# Patient Record
Sex: Female | Born: 1975
Health system: Southern US, Community
[De-identification: ages and names within clinical notes are randomized; demographics above are authoritative.]

## PROBLEM LIST (undated history)

## (undated) DIAGNOSIS — R5383 Other fatigue: Secondary | ICD-10-CM

## (undated) DIAGNOSIS — C801 Malignant (primary) neoplasm, unspecified: Secondary | ICD-10-CM

## (undated) DIAGNOSIS — Z8701 Personal history of pneumonia (recurrent): Secondary | ICD-10-CM

## (undated) DIAGNOSIS — D649 Anemia, unspecified: Secondary | ICD-10-CM

## (undated) DIAGNOSIS — Z91018 Allergy to other foods: Secondary | ICD-10-CM

## (undated) DIAGNOSIS — R0602 Shortness of breath: Secondary | ICD-10-CM

## (undated) DIAGNOSIS — T7840XA Allergy, unspecified, initial encounter: Secondary | ICD-10-CM

## (undated) DIAGNOSIS — C50919 Malignant neoplasm of unspecified site of unspecified female breast: Secondary | ICD-10-CM

## (undated) HISTORY — DX: Personal history of pneumonia (recurrent): Z87.01

## (undated) HISTORY — DX: Shortness of breath: R06.02

## (undated) HISTORY — DX: Other fatigue: R53.83

## (undated) HISTORY — PX: TUBAL LIGATION: SHX77

## (undated) HISTORY — DX: Allergy to other foods: Z91.018

---

## 1998-06-25 ENCOUNTER — Ambulatory Visit (HOSPITAL_COMMUNITY): Admission: RE | Admit: 1998-06-25 | Discharge: 1998-06-25 | Payer: Self-pay | Admitting: Obstetrics & Gynecology

## 1998-07-01 ENCOUNTER — Other Ambulatory Visit: Admission: RE | Admit: 1998-07-01 | Discharge: 1998-07-01 | Payer: Self-pay | Admitting: Obstetrics & Gynecology

## 1999-02-02 HISTORY — PX: MYOMECTOMY: SHX85

## 1999-05-01 ENCOUNTER — Inpatient Hospital Stay (HOSPITAL_COMMUNITY): Admission: RE | Admit: 1999-05-01 | Discharge: 1999-05-03 | Payer: Self-pay | Admitting: Obstetrics & Gynecology

## 1999-05-01 ENCOUNTER — Encounter (INDEPENDENT_AMBULATORY_CARE_PROVIDER_SITE_OTHER): Payer: Self-pay | Admitting: Specialist

## 1999-11-10 ENCOUNTER — Other Ambulatory Visit: Admission: RE | Admit: 1999-11-10 | Discharge: 1999-11-10 | Payer: Self-pay | Admitting: Obstetrics & Gynecology

## 2000-01-10 ENCOUNTER — Emergency Department (HOSPITAL_COMMUNITY): Admission: EM | Admit: 2000-01-10 | Discharge: 2000-01-10 | Payer: Self-pay | Admitting: Emergency Medicine

## 2000-03-17 ENCOUNTER — Inpatient Hospital Stay (HOSPITAL_COMMUNITY): Admission: AD | Admit: 2000-03-17 | Discharge: 2000-03-17 | Payer: Self-pay | Admitting: Obstetrics and Gynecology

## 2000-04-07 ENCOUNTER — Inpatient Hospital Stay (HOSPITAL_COMMUNITY): Admission: AD | Admit: 2000-04-07 | Discharge: 2000-04-07 | Payer: Self-pay | Admitting: Obstetrics and Gynecology

## 2000-04-22 ENCOUNTER — Inpatient Hospital Stay (HOSPITAL_COMMUNITY): Admission: AD | Admit: 2000-04-22 | Discharge: 2000-04-22 | Payer: Self-pay | Admitting: Obstetrics & Gynecology

## 2000-04-23 ENCOUNTER — Inpatient Hospital Stay (HOSPITAL_COMMUNITY): Admission: AD | Admit: 2000-04-23 | Discharge: 2000-04-23 | Payer: Self-pay | Admitting: Obstetrics and Gynecology

## 2000-05-01 ENCOUNTER — Inpatient Hospital Stay (HOSPITAL_COMMUNITY): Admission: AD | Admit: 2000-05-01 | Discharge: 2000-05-01 | Payer: Self-pay | Admitting: Obstetrics & Gynecology

## 2000-05-12 ENCOUNTER — Ambulatory Visit (HOSPITAL_COMMUNITY): Admission: RE | Admit: 2000-05-12 | Discharge: 2000-05-12 | Payer: Self-pay | Admitting: Obstetrics & Gynecology

## 2000-05-13 ENCOUNTER — Inpatient Hospital Stay (HOSPITAL_COMMUNITY): Admission: AD | Admit: 2000-05-13 | Discharge: 2000-05-16 | Payer: Self-pay | Admitting: Obstetrics & Gynecology

## 2000-05-26 ENCOUNTER — Encounter: Admission: RE | Admit: 2000-05-26 | Discharge: 2000-06-09 | Payer: Self-pay | Admitting: Obstetrics & Gynecology

## 2000-06-21 ENCOUNTER — Other Ambulatory Visit: Admission: RE | Admit: 2000-06-21 | Discharge: 2000-06-21 | Payer: Self-pay | Admitting: Obstetrics & Gynecology

## 2001-03-27 ENCOUNTER — Other Ambulatory Visit: Admission: RE | Admit: 2001-03-27 | Discharge: 2001-03-27 | Payer: Self-pay | Admitting: Obstetrics & Gynecology

## 2001-05-06 ENCOUNTER — Emergency Department (HOSPITAL_COMMUNITY): Admission: EM | Admit: 2001-05-06 | Discharge: 2001-05-06 | Payer: Self-pay | Admitting: Emergency Medicine

## 2001-06-13 ENCOUNTER — Inpatient Hospital Stay (HOSPITAL_COMMUNITY): Admission: AD | Admit: 2001-06-13 | Discharge: 2001-06-13 | Payer: Self-pay | Admitting: Obstetrics and Gynecology

## 2001-09-17 ENCOUNTER — Inpatient Hospital Stay (HOSPITAL_COMMUNITY): Admission: AD | Admit: 2001-09-17 | Discharge: 2001-09-17 | Payer: Self-pay | Admitting: Obstetrics and Gynecology

## 2001-09-18 ENCOUNTER — Inpatient Hospital Stay (HOSPITAL_COMMUNITY): Admission: AD | Admit: 2001-09-18 | Discharge: 2001-09-18 | Payer: Self-pay | Admitting: Obstetrics & Gynecology

## 2001-09-21 ENCOUNTER — Inpatient Hospital Stay (HOSPITAL_COMMUNITY): Admission: AD | Admit: 2001-09-21 | Discharge: 2001-09-21 | Payer: Self-pay | Admitting: Obstetrics and Gynecology

## 2001-10-04 ENCOUNTER — Ambulatory Visit (HOSPITAL_COMMUNITY): Admission: RE | Admit: 2001-10-04 | Discharge: 2001-10-04 | Payer: Self-pay | Admitting: Obstetrics & Gynecology

## 2001-10-10 ENCOUNTER — Inpatient Hospital Stay (HOSPITAL_COMMUNITY): Admission: AD | Admit: 2001-10-10 | Discharge: 2001-10-13 | Payer: Self-pay | Admitting: Obstetrics & Gynecology

## 2001-11-21 ENCOUNTER — Other Ambulatory Visit: Admission: RE | Admit: 2001-11-21 | Discharge: 2001-11-21 | Payer: Self-pay | Admitting: Obstetrics & Gynecology

## 2002-05-20 ENCOUNTER — Emergency Department (HOSPITAL_COMMUNITY): Admission: EM | Admit: 2002-05-20 | Discharge: 2002-05-20 | Payer: Self-pay | Admitting: Emergency Medicine

## 2003-01-17 ENCOUNTER — Other Ambulatory Visit: Admission: RE | Admit: 2003-01-17 | Discharge: 2003-01-17 | Payer: Self-pay | Admitting: Obstetrics & Gynecology

## 2003-05-19 ENCOUNTER — Emergency Department (HOSPITAL_COMMUNITY): Admission: EM | Admit: 2003-05-19 | Discharge: 2003-05-19 | Payer: Self-pay | Admitting: Emergency Medicine

## 2003-08-27 ENCOUNTER — Inpatient Hospital Stay (HOSPITAL_COMMUNITY): Admission: AD | Admit: 2003-08-27 | Discharge: 2003-08-27 | Payer: Self-pay | Admitting: Obstetrics and Gynecology

## 2003-09-27 ENCOUNTER — Inpatient Hospital Stay (HOSPITAL_COMMUNITY): Admission: AD | Admit: 2003-09-27 | Discharge: 2003-09-27 | Payer: Self-pay | Admitting: Obstetrics and Gynecology

## 2003-10-30 ENCOUNTER — Inpatient Hospital Stay (HOSPITAL_COMMUNITY): Admission: AD | Admit: 2003-10-30 | Discharge: 2003-10-31 | Payer: Self-pay | Admitting: Obstetrics and Gynecology

## 2003-11-23 ENCOUNTER — Inpatient Hospital Stay (HOSPITAL_COMMUNITY): Admission: RE | Admit: 2003-11-23 | Discharge: 2003-11-26 | Payer: Self-pay | Admitting: Obstetrics & Gynecology

## 2004-01-07 ENCOUNTER — Other Ambulatory Visit: Admission: RE | Admit: 2004-01-07 | Discharge: 2004-01-07 | Payer: Self-pay | Admitting: Obstetrics & Gynecology

## 2004-06-30 ENCOUNTER — Ambulatory Visit: Payer: Self-pay | Admitting: Pulmonary Disease

## 2004-07-28 ENCOUNTER — Ambulatory Visit: Payer: Self-pay | Admitting: Pulmonary Disease

## 2005-05-11 ENCOUNTER — Ambulatory Visit: Payer: Self-pay | Admitting: Pulmonary Disease

## 2005-05-25 ENCOUNTER — Ambulatory Visit: Payer: Self-pay | Admitting: Pulmonary Disease

## 2005-10-20 ENCOUNTER — Ambulatory Visit: Payer: Self-pay | Admitting: Pulmonary Disease

## 2007-04-08 ENCOUNTER — Emergency Department (HOSPITAL_COMMUNITY): Admission: EM | Admit: 2007-04-08 | Discharge: 2007-04-08 | Payer: Self-pay | Admitting: Emergency Medicine

## 2007-05-15 ENCOUNTER — Ambulatory Visit: Payer: Self-pay | Admitting: Pulmonary Disease

## 2007-05-15 ENCOUNTER — Telehealth (INDEPENDENT_AMBULATORY_CARE_PROVIDER_SITE_OTHER): Payer: Self-pay | Admitting: *Deleted

## 2007-05-15 DIAGNOSIS — J309 Allergic rhinitis, unspecified: Secondary | ICD-10-CM | POA: Insufficient documentation

## 2007-05-15 DIAGNOSIS — J45909 Unspecified asthma, uncomplicated: Secondary | ICD-10-CM | POA: Insufficient documentation

## 2007-06-06 ENCOUNTER — Telehealth (INDEPENDENT_AMBULATORY_CARE_PROVIDER_SITE_OTHER): Payer: Self-pay | Admitting: *Deleted

## 2008-05-09 ENCOUNTER — Telehealth (INDEPENDENT_AMBULATORY_CARE_PROVIDER_SITE_OTHER): Payer: Self-pay | Admitting: *Deleted

## 2008-05-09 ENCOUNTER — Ambulatory Visit: Payer: Self-pay | Admitting: Pulmonary Disease

## 2008-06-10 ENCOUNTER — Ambulatory Visit: Payer: Self-pay | Admitting: Pulmonary Disease

## 2008-10-09 ENCOUNTER — Inpatient Hospital Stay (HOSPITAL_COMMUNITY): Admission: AD | Admit: 2008-10-09 | Discharge: 2008-10-09 | Payer: Self-pay | Admitting: Specialist

## 2008-10-17 ENCOUNTER — Inpatient Hospital Stay (HOSPITAL_COMMUNITY): Admission: AD | Admit: 2008-10-17 | Discharge: 2008-10-17 | Payer: Self-pay | Admitting: Obstetrics and Gynecology

## 2008-11-01 ENCOUNTER — Inpatient Hospital Stay (HOSPITAL_COMMUNITY): Admission: RE | Admit: 2008-11-01 | Discharge: 2008-11-04 | Payer: Self-pay | Admitting: Obstetrics & Gynecology

## 2008-11-01 ENCOUNTER — Encounter (INDEPENDENT_AMBULATORY_CARE_PROVIDER_SITE_OTHER): Payer: Self-pay | Admitting: Obstetrics & Gynecology

## 2008-11-01 HISTORY — PX: OTHER SURGICAL HISTORY: SHX169

## 2009-05-16 ENCOUNTER — Telehealth (INDEPENDENT_AMBULATORY_CARE_PROVIDER_SITE_OTHER): Payer: Self-pay | Admitting: *Deleted

## 2009-07-21 ENCOUNTER — Ambulatory Visit: Payer: Self-pay | Admitting: Pulmonary Disease

## 2009-07-22 DIAGNOSIS — K2970 Gastritis, unspecified, without bleeding: Secondary | ICD-10-CM

## 2009-07-22 DIAGNOSIS — K2990 Gastroduodenitis, unspecified, without bleeding: Secondary | ICD-10-CM

## 2009-07-22 DIAGNOSIS — J189 Pneumonia, unspecified organism: Secondary | ICD-10-CM | POA: Insufficient documentation

## 2010-01-19 ENCOUNTER — Ambulatory Visit: Payer: Self-pay | Admitting: Pulmonary Disease

## 2010-01-20 ENCOUNTER — Telehealth: Payer: Self-pay | Admitting: Pulmonary Disease

## 2010-03-03 NOTE — Assessment & Plan Note (Signed)
Summary: rov/meds/apc   Primary Care Provider:  Kriste Basque  CC:  3.5 year ROV & review....  History of Present Illness: 35 y/o BF, daughter of Amber Stephens, & mother of 4 children- here for a follow up visit...   Current Problems:   ALLERGIC RHINITIS (ICD-477.9) - hx allergy testing yrs ago by drTBratton w/ +reactions to pollen, trees, grasses... she uses OTC antihist Prn...  ASTHMA (ICD-493.90) - she is a never smoker, hx asthma and AB x yrs... on PROAIR inhaler Prn and hasn't needed it in months she says... triggers include upper resp infections & springtime allergen exposures... occas requires Depo/ Pred (none for yrs)- she was on Vanceril inhaler in the 90's & stopped on her own... PFT 6/11 w/ mild persist asthma w/ obstruction on PFT >> we discussed starting ADVAIR 250- Bid regularly...  ~  baseline CXR showed clear, NAD...  ~  PFT 6/06 showed FVC=2.84 (100%), FEV1=2.00 (82%), %1sec=71, mid-flows=45%pred.  ~  PFT 6/11 showed FVC=2.28 (78%), FEV1=1.64 (66%), %1sec=72, mid-flows=38%pred.  Hx of PNEUMONIA (ICD-486) - hx LLL CAP in 1997 resolved w/ Cephalosporin Rx + ZPak...  Hx of GASTRITIS (ICD-535.50) - remote hx gastritis treated w/ H2Blockers at the time... denies dysphagia, heartburn, indigestion, n/v, abd pain, etc...  Health Maintenance:  GYN= DrNeal et al... GI= DrStark yrs ago (she works at [employer])... prev labs here 2007 were all WNL including CBC, Chem, FLP, TSH...   Preventive Screening-Counseling & Management  Alcohol-Tobacco     Smoking Status: never  Allergies (verified): No Known Drug Allergies  Comments:  Nurse/Medical Assistant: The patient's medications and allergies were reviewed with the patient and were updated in the Medication and Allergy Lists.  Past History:  Past Medical History: ALLERGIC RHINITIS (ICD-477.9) ASTHMA (ICD-493.90) Hx of PNEUMONIA (ICD-486) Hx of GASTRITIS (ICD-535.50)  Past Surgical History: Cesarean sections BTL  10/10 by DrLowe  Family History: Reviewed history and no changes required.  Social History: Reviewed history from 05/09/2008 and no changes required. 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    Vital Signs:  Patient profile:   34 year old female Height:      62 inches Weight:      185 pounds BMI:     33.96 O2 Sat:      100 % on Room air Temp:     97.7 degrees F oral Pulse rate:   77 / minute BP sitting:   122 / 76  (left arm) Cuff size:   regular  Vitals Entered By: Randell Loop CMA (July 21, 2009 2:41 PM)  O2 Sat at Rest %:  100 O2 Flow:  Room air CC: 3.5 year ROV & review... Is Patient Diabetic? No Pain Assessment Patient in pain? no      Comments meds updated today with pt   Physical Exam  Additional Exam:  WD, WN, 35 y/o BF in NAD... GENERAL:  Alert & oriented; pleasant & cooperative... HEENT:  [place]/AT, EOM-wnl, PERRLA, Fundi-benign, EACs-clear, TMs-wnl, NOSE-clear, THROAT-clear & wnl. NECK:  Supple w/ full ROM; no JVD; normal carotid impulses w/o bruits; no thyromegaly  or nodules palpated; no lymphadenopathy. CHEST:  Clear to P & A; without wheezes/ rales/ or rhonchi. HEART:  Regular Rhythm; without murmurs/ rubs/ or gallops. ABDOMEN:  Soft & nontender; normal bowel sounds; no organomegaly or masses detected. EXT: without deformities or arthritic changes; no varicose veins/ venous insuffic/ or edema. NEURO:  CN's intact; motor  testing normal; sensory testing normal; gait normal & balance OK. DERM:  No lesions noted; no rash etc...    Pulmonary Function Test Date: 07/21/2009 3:20 PM Gender: Female  Pre-Spirometry FVC    Value: 2.28 L/min   Pred: 2.93 L/min     % Pred: 78 % FEV1    Value: 1.64 L     Pred: 2.47 L     % Pred: 66 % FEV1/FVC  Value: 72 %     Pred: 85 %     % Pred: -- % FEF 25-75  Value: 1.10 L/min   Pred: 2.91 L/min     % Pred: 38 %  Comments: Compatible w/ mild airflow obstruction...  SN  Impression & Recommendations:  Problem # 1:  ASTHMA (ICD-493.90) She appears to have mild persistant asthma >> particularly signif in that she is totally asymptoatic at this time & hasn't needed her rescue inhalerin months... we discussed the signif of this & the need for controller medication... she chose ADVAIR 250- Bid regularly & we will refill the Proair for Prn use...  Her updated medication list for this problem includes:    Proair Hfa 108 (90 Base) Mcg/act Aers (Albuterol sulfate) .Marland Kitchen... 1-2 puffs every 4-6 hours as needed    Advair Diskus 250-50 Mcg/dose Aepb (Fluticasone-salmeterol) .Marland Kitchen... 1 inhalation two times a day...  Orders: Spirometry w/Graph (94010)  Problem # 2:  OTHER MEDICAL PROBLEMS AS NOTED>>>  Complete Medication List: 1)  Proair Hfa 108 (90 Base) Mcg/act Aers (Albuterol sulfate) .Marland Kitchen.. 1-2 puffs every 4-6 hours as needed 2)  Advair Diskus 250-50 Mcg/dose Aepb (Fluticasone-salmeterol) .Marland Kitchen.. 1 inhalation two times a day...  Patient Instructions: 1)  Today we updated your med list- see below.... 2)  We decided to start ASTHMA therapy w/ ADVAIR one inhalation two times a day... 3)  Continue ther PROAIR rescue inhaler as needed... 4)  Call for any questions.Marland KitchenMarland Kitchen 5)  Please schedule a follow-up appointment in 6 months. Prescriptions: PROAIR HFA 108 (90 BASE) MCG/ACT AERS (ALBUTEROL SULFATE) 1-2 puffs every 4-6 hours as needed  #1 x 11   Entered and Authorized by:   Michele Mcalpine MD    Signed by:   Michele Mcalpine MD on 07/21/2009   Method used:   Print then Give to Patient   RxID:   [id number] ADVAIR DISKUS 250-50 MCG/DOSE AEPB (FLUTICASONE-SALMETEROL) 1 inhalation two times a day...  #1 x 11   Entered and Authorized by:   Michele Mcalpine MD   Signed by:   Michele Mcalpine MD on 07/21/2009   Method used:   Print then Give to Patient   RxID:   [id number]    Immunization History:  Influenza Immunization History:    Influenza:  historical (11/14/2008)    CardioPerfect Spirometry  ID: [id number] Patient: AMALYA, SALMONS DOB: Jan 06, 1976 Age: 35 Years Old Sex: Female Race: Black Physician: Tigerlily Christine Height: 62 Weight: 185 Status: Unconfirmed Past Medical History:   ALLERGIC RHINITIS (ICD-477.9)-seasonal   ASTHMA (ICD-493.90)-intermittent as needed proair  HMA-  GYN - Dr. Jennette Kettle   Recorded: 07/21/2009 3:20 PM  Parameter  Measured Predicted %Predicted FVC  2.28        2.93        78 FEV1     1.64        2.47        66 FEV1%   72        84.72        -- PEF    4.19        6.42        65.20   Interpretation:

## 2010-03-03 NOTE — Progress Notes (Signed)
Summary: Proair refill  Phone Note Call from Patient Call back at [phone]   Caller: Patient Call For: nadel Reason for Call: Refill Medication, Talk to Nurse Summary of Call: rx for albuterol hand held inhaler sent to [facility] on [address] [facility]. Initial call taken by: Eugene Gavia,  May 16, 2009 10:58 AM  Follow-up for Phone Call        Tammy this patient has never had follow-up with SN. Please advise if okay to refill x1 and then no more refills til seen by SN. Or ROV with you first before refill?Michel Bickers [facility]  May 16, 2009 11:06 AM  Additional Follow-up for Phone Call Additional follow up Details #1::        must see Dr. Kriste Basque  can have 1 proair only w/ no refiilss  set up ov w/ dr Kriste Basque by [place] before you send rx  Additional Follow-up by: Rubye Oaks NP,  May 16, 2009 11:12 AM    Additional Follow-up for Phone Call Additional follow up Details #2::    LMOMTCB. The pt has appt with SN on July 21, 2009 and MUST keep this appt for any other refills.Michel Bickers [facility]  May 16, 2009 11:23 AM  The patient is aware and had verbal understanding that she has to keep her appt in June with SN before any of her medications can be refilled. RX for rescue inhaler sent to pharmacy. [facility] [address]  Follow-up by: Michel Bickers CMA,  May 16, 2009 11:39 AM  Prescriptions: PROAIR HFA 108 (90 BASE) MCG/ACT AERS (ALBUTEROL SULFATE) 1-2 puffs every 4-6 hours as needed  #1 x 0   Entered by:   Michel Bickers CMA   Authorized by:   Rubye Oaks NP   Signed by:   Michel Bickers CMA on 05/16/2009   Method used:   Electronically to        [facility]. [address] [phone]* (retail)       [address]       [place], [place]  [address]       Ph: [phone] or [phone]       Fax: [phone]   RxID:   [id number]

## 2010-03-05 NOTE — Progress Notes (Signed)
Summary: nos appt  Phone Note Call from Patient   Caller: [email or web address]  Call For: Sybella Harnish Summary of Call: LMTCB x2 to rsc nos from 12/19. Initial call taken by: Darletta Moll,  January 20, 2010 3:49 PM

## 2010-05-07 ENCOUNTER — Other Ambulatory Visit: Payer: Self-pay | Admitting: Adult Health

## 2010-05-07 LAB — CBC
HCT: 22.6 % — ABNORMAL LOW (ref 36.0–46.0)
HCT: 25.9 % — ABNORMAL LOW (ref 36.0–46.0)
Hemoglobin: 7.8 g/dL — CL (ref 12.0–15.0)
Hemoglobin: 8.9 g/dL — ABNORMAL LOW (ref 12.0–15.0)
MCHC: 34.3 g/dL (ref 30.0–36.0)
MCHC: 34.3 g/dL (ref 30.0–36.0)
MCV: 92.6 fL (ref 78.0–100.0)
MCV: 92.7 fL (ref 78.0–100.0)
MCV: 93.3 fL (ref 78.0–100.0)
Platelets: 262 K/uL (ref 150–400)
Platelets: 267 K/uL (ref 150–400)
Platelets: 324 10*3/uL (ref 150–400)
RBC: 2.44 MIL/uL — ABNORMAL LOW (ref 3.87–5.11)
RBC: 2.69 MIL/uL — ABNORMAL LOW (ref 3.87–5.11)
RBC: 2.79 MIL/uL — ABNORMAL LOW (ref 3.87–5.11)
RDW: 14.2 % (ref 11.5–15.5)
RDW: 14.7 % (ref 11.5–15.5)
WBC: 11.9 K/uL — ABNORMAL HIGH (ref 4.0–10.5)
WBC: 8.9 10*3/uL (ref 4.0–10.5)
WBC: 9.8 K/uL (ref 4.0–10.5)

## 2010-05-08 LAB — CBC
HCT: 33.9 % — ABNORMAL LOW (ref 36.0–46.0)
MCHC: 33.8 g/dL (ref 30.0–36.0)
MCV: 92.4 fL (ref 78.0–100.0)
Platelets: 310 10*3/uL (ref 150–400)
Platelets: 324 10*3/uL (ref 150–400)
RBC: 3.67 MIL/uL — ABNORMAL LOW (ref 3.87–5.11)
RBC: 3.76 MIL/uL — ABNORMAL LOW (ref 3.87–5.11)
RDW: 14.4 % (ref 11.5–15.5)
WBC: 10.9 10*3/uL — ABNORMAL HIGH (ref 4.0–10.5)

## 2010-05-08 LAB — WET PREP, GENITAL: Yeast Wet Prep HPF POC: NONE SEEN

## 2010-05-08 LAB — COMPREHENSIVE METABOLIC PANEL
AST: 22 U/L (ref 0–37)
Albumin: 3.4 g/dL — ABNORMAL LOW (ref 3.5–5.2)
Alkaline Phosphatase: 102 U/L (ref 39–117)
BUN: 4 mg/dL — ABNORMAL LOW (ref 6–23)
CO2: 22 mEq/L (ref 19–32)
Chloride: 104 mEq/L (ref 96–112)
GFR calc non Af Amer: >60 mL/min (ref >60)
Potassium: 3.7 mEq/L (ref 3.5–5.1)
Total Bilirubin: 0.6 mg/dL (ref 0.3–1.2)

## 2010-05-08 LAB — RPR: RPR Ser Ql: NONREACTIVE

## 2010-06-19 NOTE — Op Note (Signed)
[facility]  Patient:    Amber Stephens, Amber Stephens                   MRN: [id number] Proc. Date: 05/13/00 Adm. Date:  [id number] Attending:  Minette Headland                           Operative Report  PREOPERATIVE DIAGNOSES:       1. Intrauterine pregnancy [redacted] weeks gestation.                               2. Mature lecithin-sphingomyelin ratio on                                  amniotic fluid a 4.8:1.                               3. Surgically scarred uterus.                               4. Preterm cervical ______ .  POSTOPERATIVE DIAGNOSES:      1. Intrauterine pregnancy [redacted] weeks gestation.                               2. Mature lecithin-sphingomyelin ratio on                                  amniotic fluid a 4.8:1.                               3. Surgically scarred uterus.                               4. Preterm cervical ______ .                               5. Delivery of viable female infant, Apgars 9 and                                  9, assigned by Dr. Dagoberto Ligas,                                  neonatologist in attendance.  Cord arterial                                  pH was 7.26.  SURGEON:                      Freddy Finner, M.D.  ANESTHESIA:                   Spinal.  INTRAOPERATIVE COMPLICATIONS:  None.  ESTIMATED INTRAOPERATIVE BLOOD LOSS:    600-800 cc.  DESCRIPTION OF PROCEDURE:     Details of the present illness recorded in the admission note.  The patient was admitted undergoing surgery, brought to the operating room and there placed under adequate spinal anesthesia, placed in the dorsal recumbent position with elevation of the right hip by 15 degrees. Abdomen was prepped in the usual fashion.  Foley catheter was placed using sterile technique.  Sterile drapes were applied.  A lower abdominal incision was made and in the process, an old lower abdominal transverse scar excised. The incision was  carried sharply down to fascia which was entered sharply and extended to the skin incision.  The rectus sheath was developed with blunt and sharp dissection.  Rectus muscles were divided in the midline.  Peritoneum was entered and extended to the extent of the skin incision.  Bladder blade was placed at this point.  A transverse incision was made in the visceral peritoneum overlying the lower uterine segment and the bladder bluntly dissected off the lower segment.  The lower segment was then entered in a transverse direction and extended bluntly to the extent of skin incision. Amniotic membrane was ruptured; fluid was clear.  A viable female infant was then delivered without significant difficulty.  Cord blood was obtained for routine and for arterial gases.  Placenta and other products of conception removed from the uterus.  This was confirmed by manual exploration of the uterus.  The uterine incision was then closed with running 0 Monocryl suture. Irrigation was carried out.  Bladder flap was relatively approximated just for the uterine closure, and an additional suture was not used.  The tubes and ovaries were inspected and found to be normal.  The uterine fundus was palpably normal, and no palpable adhesions were noted on the fundus. Appropriate pack, needle, and instrument counts were obtained.  Abdominal incision was closed in layers.  Running 0 Monocryl was used to close the peritoneum and reapproximate the rectus muscles.  Fascia was closed with running 0 Panacryl.  Skin was closed with wide skin staples and 0.25 inch Steri-Strips.  The patient tolerated the procedure well and was taken to the recovery room in good condition. DD:  05/14/00 TD:  05/14/00 Job: [id number]

## 2010-06-19 NOTE — Op Note (Signed)
Amber Stephens, Amber Stephens            ACCOUNT NO.:  [id number]   MEDICAL RECORD NO.:  [id number]          PATIENT TYPE:  INP   LOCATION:  9198                          FACILITY:  [facility]   PHYSICIAN:  Freddy Finner, M.D.   DATE OF BIRTH:  [birth year]   DATE OF PROCEDURE:  11/23/2003  DATE OF DISCHARGE:                                 OPERATIVE REPORT   PREOPERATIVE DIAGNOSES:  1.  Intrauterine pregnancy [redacted] weeks gestation.  2.  Surgically scarred uterus.  3.  Mild light onset of pregnancy induced hypertension.   POSTOPERATIVE DIAGNOSES:  1.  Intrauterine pregnancy [redacted] weeks gestation.  2.  Surgically scarred uterus.  3.  Mild light onset of pregnancy induced hypertension.   OPERATION:  Repeat low transverse cervical cesarean section with delivery of  viable female infant, Apgar's of 9 and 10. Cord pH 7.29 on arterial cord  sample, birth weight 6 pounds 5 ounces.   SURGEON:  Freddy Finner, M.D.   ASSISTANT:  No additional assistant other than the scrub nurse.   COMPLICATIONS:  None.   ESTIMATED BLOOD LOSS:  Less than or equal to 800 mL.   Details of the present illness recorded in the admission note. The patient  was admitted on the morning of surgery. She was brought to the operating  room, placed under adequate spinal anesthesia, placed in the dorsal  recumbent position. Abdomen was prepped and draped in the usual fashion,  Foley catheter was placed using sterile technique.  A low abdominal  transverse incision was made through an old scar, carried sharply down the  fascia which was entered sharply and extended to the extent of skin  incision.  There was notable scarring because this is the fourth procedure  through this incision.  The rectus sheath was developed carefully with blunt  and sharp dissection.  The rectus muscles were divided in the midline, the  peritoneum was entered sharply and extended bluntly to the extent of the  skin incision.  The bladder blade was placed. A  transverse incision was made  in the lower uterine segment, carried down to the amnion. The incision was  extended sharply with bandage scissors. A small midline extension was made  vertically on the uterus as well as an incision in the fascia vertically to  allow more room for delivery because of the scarring. Also a small incision  was made in the right rectus muscle. Doing all of these things plus using  the kiwi vacuum device, the infant was delivered successfully, Apgar's and  other statistics are noted above. The placenta and other parts of conception  removed from the uterus after collecting of cord blood samples. The uterus  was delivered onto the anterior abdominal wall. The uterus itself appeared  to be normal as did the tubes and ovaries. The uterine incision was closed  with running locking #0 Monocryl followed by an imbricating suture of #0  Monocryl. The uterus was placed back into the abdominal cavity. Irrigation  was carried out. Hemostasis was adequate. Pack, needle and instrument counts  were correct. Abdominal incision was closed in layers,  running #0 Monocryl  was used to close the peritoneum to reapproximate the rectus muscles and to  reduce the defect of the incision in the right rectus muscle.  #0 PDS was  then used to close the vertical portion of the fascial incision.  The  transverse incision was closed with a running #0 PDS suture. The  subcutaneous tissue was approximated with running 2-0 Plain. The skin was  closed with wide skin staples. The patient tolerated the operative procedure  well  and was taken to the recovery room in good condition.     Hosie Spangle   WRN/MEDQ  D:  11/23/2003  T:  11/23/2003  Job:  [id number]

## 2010-06-19 NOTE — H&P (Signed)
NAMESHAMAYA, KAUER            ACCOUNT NO.:  [id number]   MEDICAL RECORD NO.:  [id number]          PATIENT TYPE:  INP   LOCATION:  NA                            [facility]:  [facility]   PHYSICIAN:  Freddy Finner, M.D.   DATE OF BIRTH:  01/29/1976   DATE OF ADMISSION:  11/23/2003  DATE OF DISCHARGE:                                HISTORY & PHYSICAL   ADMISSION DIAGNOSES:  1.  Intrauterine pregnancy [redacted] weeks gestation.  2.  Surgically scarred uterus by previous deep myomectomy and previous      cesarean section x2.  3.  Mild late onset pregnancy induced hypertension and proteinuria.   The patient is a 35 year old, black, married female, gravida 3, para 2 who  had a myomectomy with a deep incision into the uterus prior to her first  conception. She subsequently delivered two infants by cesarean birth, most  recently in September of 2003. Her prenatal course to date has been  uncomplicated until the 19th of this month at which time she was first noted  to have proteinuria and a mild bump in her blood pressure.  She was seen on  the day prior to admission for followup of pregnancy induced hypertension  labs which showed all parameters within normal limits. She did, however,  have 1+ proteinuria, blood pressure 120/60 in the office and based on her  gestational age of [redacted] weeks, she is now admitted for cesarean delivery. She  is having some lower abdominal pressure and a mild headache but with no  other CNS symptoms.   REVIEW OF SYMPTOMS:  Her current review of systems other than above is  negative.   PAST MEDICAL HISTORY:  Recorded in detail in the prenatal summary and will  not be repeated at this time.   PHYSICAL EXAMINATION:  HEENT:  Grossly within normal limits.  VITAL SIGNS:  Blood pressure is 120/60, deep tendon reflexes are +1 with no  evidence of clonus.  HEART:  Normal sinus rhythm without murmurs, rubs or gallops.  LUNGS:  Clear to auscultation.  ABDOMEN:  Gravida, estimated  fetal size of 7 pounds.  PELVIC:  Cervix is not checked.  EXTREMITIES:  +1 edema.   ASSESSMENT:  Intrauterine pregnancy, [redacted] weeks gestation, mild late onset  pregnancy induced hypertension, surgically scarred uterus.   PLAN:  Repeat cesarean delivery.     Hosie Spangle   WRN/MEDQ  D:  11/22/2003  T:  11/22/2003  Job:  [id number]

## 2010-06-19 NOTE — Op Note (Signed)
NAME:  Amber Stephens, Amber Stephens                      ACCOUNT NO.:  [id number]   MEDICAL RECORD NO.:  [id number]                   PATIENT TYPE:  INP   LOCATION:  9127                                 [facility]ACILITY:  [facility]   PHYSICIAN:  Freddy Finner, M.D.                DATE OF BIRTH:  06/05/1975   DATE OF PROCEDURE:  10/10/2001  DATE OF DISCHARGE:                                 OPERATIVE REPORT   PREOPERATIVE DIAGNOSES:  1. Intrauterine pregnancy at 37-5/[redacted] weeks gestation.  2. Surgically scarred uterus by a previous date myomectomy and by previous     cesarean delivery.   POSTOPERATIVE DIAGNOSES:  1. Intrauterine pregnancy at 37-5/[redacted] weeks gestation.  2. Surgically scarred uterus by a previous date myomectomy and by previous     cesarean delivery.  3. Extensive abdominal wall adhesions and thickening of the scars.   OPERATIVE PROCEDURE:  1. Repeat low transverse cervical cesarean section.  2. Dissection of adhesions.  3. Delivery of a viable female infant. Apgars 8 and 9.  Birth weight 6 pounds     12 pounds. Cord arterial pH 7.29.   INTRAOPERATIVE COMPLICATIONS:  None.   INTRAOPERATIVE ANESTHESIA:  Spinal.   ESTIMATED INTRAOPERATIVE BLOOD LOSS:  Less than or equal to 800 cc.   DESCRIPTION OF PROCEDURE:  The patient is a 35 year old pregnant for the  second time.  She had an amniocentesis done approximately six days prior to  this admission which showed 1.9 to 1 L/S ratio and no PT present.  This was  done and I attempted to deliver the infant before labor, particularly since  the patient was having preterm labor contractions managed with p.o.  terbutaline.  It was elected to reschedule her for this date and she was  admitted today for delivery.   She was admitted on the morning of surgery, brought to the operating room,  placed under adequate spinal anesthesia and placed in the dorsal recumbent  position.  The abdomen was draped in the usual fashion with Betadine  solution.  A Foley  catheter was placed using sterile technique.  Sterile  drapes were applied.  A lower abdominal scar was felt to be excised.  The  incision was carried sharply down to fascia.  The fascia was thickened and  densely scarred.  With careful sharp dissection the fascia was entered,  rectus sheath was developed superiorly and inferiorly with extensive  dissection of adhesions without intraoperative complications.  Rectus  muscles divided in the midline. The peritoneum was entered sharply and  extended bluntly to the extent of skin incision.  A bladder blade was  placed. A transverse incision was made in the lower uterine segment and  carried down through the amnion.  This was extended bluntly in a transverse  direction.  Amnion was ruptured. Fluid was clear.  Using the kiwi vacuum  extractor and after partial division of the rectus muscles the infant was  delivered without difficulty.  The rectus muscle incision division was  required because of the adhesions and scarring.  The infant had Apgars,  birth weight and pH as noted above.  Placenta and other parts of conception  were removed from the uterus.  The uterine incision was closed in a single  layer of running locking 0 Monocryl. Tubes and ovaries were inspected and  found to be normal bilaterally.  The uterus was palpably normal.  Irrigation  was carried out.  Hemostasis was complete. All packs, needles and  instruments removed. The abdominal incision was closed in layers. A running  0 Vicryl was used to close the peritoneum.  Interrupted Monocryl's were used  to reapproximate the rectus muscle of each side.  This was tied to the  fascial on the right side because of a larger separation in that location.  The fascia was then closed with running 3-0 PDS.  Subcutaneous tissue was  closed with interrupted 2-0 plain. Skin was closed with broad skin staples  and 1/4-inch Steri-Strips. The patient tolerated the procedure well and was  taken to  recovery in good condition.                                               Freddy Finner, M.D.    WRN/MEDQ  D:  10/10/2001  T:  10/10/2001  Job:  [id number]

## 2010-06-19 NOTE — Discharge Summary (Signed)
NAME:  Amber Stephens, Amber Stephens                      ACCOUNT NO.:  [id number]   MEDICAL RECORD NO.:  [id number]                   PATIENT TYPE:  INP   LOCATION:  9127                                 FACILITY:  [facility]   PHYSICIAN:  Guy Sandifer. Henderson Cloud, M.D.              DATE OF BIRTH:  1975/05/05   DATE OF ADMISSION:  10/10/2001  DATE OF DISCHARGE:  10/13/2001                                 DISCHARGE SUMMARY   ADMITTING DIAGNOSES:  1. Intrauterine pregnancy at 31 and 5/7 weeks estimated gestational age.  2. Surgical scarred uterus with a previous myomectomy and previous cesarean     delivery.   DISCHARGE DIAGNOSES:  1. Status post low transverse cesarean delivery.  2. Viable female infant.   PROCEDURE:  1. Repeat low transverse cesarean section.  2. Dissection of adhesions.   REASON FOR ADMISSION:  Please see written H&P.   HOSPITAL COURSE:  The patient was admitted at 18 and 5/7 weeks estimated  gestational age for scheduled cesarean delivery.  The patient had had a  previous myomectomy and a previous cesarean delivery.  The patient was taken  to the operating room where spinal anesthesia was administered without  difficulty.  A low transverse incision was made with the delivery of a  viable female infant weighing 6 pounds 12 ounces with Apgars of 8 at one  minute and 9 at five minutes.  Arterial cord pH was 7.29.  The patient  tolerated procedure well and was taken to the recovery room in stable  condition.  On postoperative day one patient had good return of bowel  function.  Abdomen was soft.  Abdominal dressing was partially removed and  incision was noted to be clean, dry, and intact.  Laboratories revealed  hemoglobin of 10.4, platelet count of 306,000, WBC count of 12.1.  On  postoperative day two patient was tolerating a regular diet without  complaints of nausea and vomiting.  She was ambulating well.  Fundus was  firm and nontender.  Incision was clean, dry, and intact.  On  postoperative  day three patient was doing well.  Incision was clean, dry, and intact.  Staples were removed and patient was discharged home.   CONDITION ON DISCHARGE:  Good.   DIET:  Regular, as tolerated.   ACTIVITY:  No heavy lifting.  No vaginal entry.  No driving x2 weeks.   FOLLOW UP:  The patient is to follow up in the office in one to two weeks  for an incision check.  She is to call for temperature greater than 100  degrees, persistent nausea/vomiting, heavy vaginal bleeding, and/or redness  or drainage from the incision site.    DISCHARGE MEDICATIONS:  1. Percocet 5/325 number 30 one p.o. q.4-6h. p.r.n. pain.  2. Prenatal vitamins one p.o. daily.  3. Colace one p.o. daily p.r.n.     Julio Sicks, NP  Guy Sandifer Henderson Cloud, M.D.    CC/MEDQ  D:  11/11/2001  T:  11/13/2001  Job:  [id number]

## 2010-06-19 NOTE — H&P (Signed)
   Amber Stephens, EDELSON                        ACCOUNT NO.:  [id number]   MEDICAL RECORD NO.:  [id number]                   PATIENT TYPE:   LOCATION:                                       FACILITY:  [facility]   PHYSICIAN:  Freddy Finner, M.D.                DATE OF BIRTH:   DATE OF ADMISSION:  10/10/2001  DATE OF DISCHARGE:                                HISTORY & PHYSICAL   ADMITTING DIAGNOSES:  1. Intrauterine pregnancy at 37-5/[redacted] weeks gestation.  2. Surgically scarred uterus by previous multiple myomectomies and previous     cesarean delivery.   HISTORY OF PRESENT ILLNESS:  The patient is a 35 year old white married  female gravida 2, para 1 by cesarean section done because of previous  myomectomy with deep incision of the myometrium.  This pregnancy has been  complicated by preterm contractions managed with rest, PR terbutaline and  fluid hydration.  Sonogram and amniocentesis for fetal lung maturity was  performed on October 04, 2001 at 36-6/[redacted] weeks gestation.  At that time the  L/S ratio was 1.9 to 1.0, no PTE was present.  Based on this finding, we  have elected to delay her admission for 1 week.  She is now admitted for  cesarean delivery at 37-5/[redacted] weeks gestation.  There have been essentially no  other prenatal complications.   REVIEW OF SYSTEMS:  Her current review of systems is negative, there are no  cardiopulmonary, GI or GU complaints.   PAST MEDICAL HISTORY/FAMILY HISTORY:  According to the prenatal summary and  will not be repeated.   PHYSICAL EXAMINATION:  HEENT: Grossly within normal limits.  NECK: Thyroid gland is not palpably large.  VITAL SIGNS: Blood pressure in the office was 124/80.  CHEST: Clear to auscultation.  HEART: Normal sinus rhythm without murmurs, rubs, or gallops.  ABDOMEN: Gravid, estimated fetal weight of 6-1/2 pounds.  EXTREMITIES: Edema +1 without cyanosis or clubbing.   ASSESSMENT:  Intrauterine pregnancy at 37-5/[redacted] weeks gestation.  Inappropriate candidate for vaginal birth because of multiple myomectomies  with deep incisions of the myometrium.   PLAN:  Cesarean delivery.                                               Freddy Finner, M.D.    WRN/MEDQ  D:  10/09/2001  T:  10/09/2001  Job:  [id number]

## 2010-06-19 NOTE — Discharge Summary (Signed)
[facility]  Patient:    Amber Stephens, Amber Stephens                   MRN: [id number] Adm. Date:  [id number] Disc. Date: [id number] Attending:  Minette Headland Dictator:   Danie Chandler, R.N.                           Discharge Summary  ADMITTING DIAGNOSES:          1. Intrauterine pregnancy at [redacted] weeks gestation.                               2. Mature L:S ratio on amniotic fluid 4.8:1.                               3. Surgically scarred uterus.                               4. Preterm cervical change.  DISCHARGE DIAGNOSES:          1. Intrauterine pregnancy at [redacted] weeks gestation.                               2. Mature L:S ratio on amniotic fluid 4.8:1.                               3. Surgically scarred uterus.                               4. Preterm cervical change.  PROCEDURE:                    Primary cesarean section.  REASON FOR ADMISSION:         Please see dictated H&P.  HOSPITAL COURSE:              The patient was taken to the operating room and underwent the above named procedure without complication.  This was productive of a viable female infant with Apgars of 9 at one minute and 9 at five minutes and an arterial cord pH of 7.26.  Postoperatively on day #1 the patients hemoglobin was 11.6, hematocrit 32.8, and white blood cell count 11.4. Patient was without complaint on this day.  On postoperative day #2 the patient had one temperature elevation to 100.7.  Blood pressures were stable. She was ambulating well without difficulty and had good pain control.  She was also tolerating a regular diet and had a good return of bowel function.  She was stable on this day and her temperature was watched.  On postoperative day #3 patient had been afebrile greater than 24 hours.  She was without complaint and she was discharged home.  CONDITION ON DISCHARGE:       Good.  DIET:                         Regular, as tolerated.  ACTIVITY:                      No heavy lifting,  no driving, no vaginal entry.  FOLLOW-UP:                    She is to follow-up in the office in two weeks for incision check.  She is to call for temperature greater than 100 degrees, persistent nausea or vomiting, heavy vaginal bleeding, and/or redness or drainage from the incision site.  DISCHARGE MEDICATIONS:        1. Prenatal vitamins one p.o. q.d.                               2. Tylox as directed by M.D. DD:  05/31/00 TD:  05/31/00 Job: [id number]

## 2010-06-19 NOTE — Discharge Summary (Signed)
NAMELATRISH, Amber Stephens            ACCOUNT NO.:  [id number]   MEDICAL RECORD NO.:  [id number]          PATIENT TYPE:  INP   LOCATION:  9116                          FACILITY:  [facility]   PHYSICIAN:  Dineen Kid. Rana Snare, M.D.    DATE OF BIRTH:  [birth year]   DATE OF ADMISSION:  11/23/2003  DATE OF DISCHARGE:  11/26/2003                                 DISCHARGE SUMMARY   ADMITTING DIAGNOSES:  1.  Intrauterine pregnancy at 37 weeks estimated gestational age.  2.  Previously-scarred uterus by both myomectomy and cesarean delivery.  3.  Mild pregnancy-induced hypertension.   DISCHARGE DIAGNOSES:  1.  Status post low transverse cesarean section.  2.  Viable female infant.   PROCEDURE:  Repeat low transverse cesarean section.   REASON FOR ADMISSION:  Please see dictated H&P.   HOSPITAL COURSE:  The patient was a 35 year old African-American married  female gravida 3 para 2 that was admitted to [facility] for  a scheduled cesarean section.  The patient had had a previous deep  myomectomy and two previous cesarean deliveries.  On the morning of  admission the patient was taken to the operating room where spinal  anesthesia was administered without difficulty.  A low transverse incision  was made with the delivery of a viable female infant weighing 6 pounds 5.4  ounces with Apgars of 9 at one minute and 9 at five minutes.  Arterial cord  pH was 7.29.  The patient tolerated the procedure well and was taken to the  recovery room in stable condition.  On postoperative day #1, the patient was  without complaint other than some mild wheezing.  Vital signs were stable,  she was afebrile.  Fundus was firm and nontender.  Abdominal dressing was  noted to be clean, dry, and intact.  The patient had known asthma and she  did use albuterol inhaler as needed.  On postoperative day #2, the patient  was without complaint.  Vital signs remained stable, she was afebrile.  Abdomen was soft with good  return of bowel function.  Fundus was firm and  nontender.  Abdominal dressing had been removed revealing an incision that  was clean, dry, and intact.  Lungs were clear to auscultation without  wheezes.  Laboratories revealed hemoglobin of 9.8.  On postoperative day #3,  the patient was without complaint.  Vital signs were stable, she remained  afebrile.  Abdomen soft, fundus was firm and nontender.  Incision was clean,  dry, and intact.  Staples were removed and the patient was discharged home.   CONDITION ON DISCHARGE:  Good.   DIET:  Regular as tolerated.   ACTIVITY:  No heavy lifting, no driving x2 weeks, no vaginal entry..   FOLLOW-UP:  The patient is to follow up in the office in 7-10 days for an  incision check.  She is to call for temperature greater than 100 degrees,  persistent nausea and vomiting, heavy vaginal bleeding, and/or redness or  drainage from the incisional site.   DISCHARGE MEDICATIONS:  1.  Tylox #30 one p.o. q.4-6h. p.r.n.  2.  Motrin 600  mg q.6h.  3.  Prenatal vitamins one p.o. daily.  4.  Colace one p.o. daily p.r.n.      CC/MEDQ  D:  12/20/2003  T:  12/20/2003  Job:  [id number]

## 2010-06-19 NOTE — H&P (Signed)
[facility]  Patient:    Amber Stephens, Amber Stephens           MRN: [id number] Adm. Date:  [id number] Disc. Date: [id number] Attending:  Minette Headland                         History and Physical  ADMITTING DIAGNOSIS:          Intrauterine pregnancy, [redacted] weeks gestation,                               surgically scarred uterus by multiple                               myomectomies, preterm labor arrested by                               tocolytics and bed rest, mature                               lecithin/sphingomyelin ratio of 4.8:1, for                               surgical delivery.  HISTORY OF PRESENT ILLNESS:   The patient is a 35 year old white married female, primigravida who has previously had multiple myomectomies, and has conceived, and had an uncomplicated prenatal course except for early cervical changes and uterine contractions.  This has managed by p.o. terbutaline, bed rest, and close followup.  She has noted marked increase in uterine activity over the last couple of weeks and she was originally scheduled for surgical delivery on May 26, 2000.  Because of the increase in contractions, it was elected to obtain an amniocentesis which was done showing an L/S of 4.8:1, and she is now admitted for cesarean delivery.  REVIEW OF SYSTEMS:            Current review of systems is otherwise negative.  PAST MEDICAL HISTORY:         As recorded in the prenatal summary and will not be repeated.  There are no significant illnesses and no allergies to medications.  PHYSICAL EXAMINATION:  HEENT:                        Grossly within normal limits.  NECK:                         Thyroid gland is not palpably enlarged.  VITAL SIGNS:                  Blood pressure in the office was 120/60.  CHEST:                        Clear to auscultation.  HEART:                        Normal sinus rhythm without significant murmurs, without rubs or  gallops.  ABDOMEN:                      Gravid.  Estimated fetal size of  7+.  CERVIX:                       Two dilated, 50% effaced, vertex at -2.  EXTREMITIES:                  Plus one edema without cyanosis or clubbing.  ASSESSMENT:                   Intrauterine pregnancy at [redacted] weeks gestation,                               surgically scarred uterus, mature                               lecithin/sphingomyelin ratio.  PLAN:                         Primary low transverse cervical cesarean section. DD:  05/13/00 TD:  05/13/00 Job: [id number]

## 2011-05-17 ENCOUNTER — Telehealth: Payer: Self-pay | Admitting: Pulmonary Disease

## 2011-05-17 ENCOUNTER — Other Ambulatory Visit: Payer: Self-pay | Admitting: Adult Health

## 2011-05-17 MED ORDER — ALBUTEROL SULFATE HFA 108 (90 BASE) MCG/ACT IN AERS: INHALATION_SPRAY | RESPIRATORY_TRACT | Status: DC

## 2011-05-17 NOTE — Telephone Encounter (Signed)
I spoke with pt and she is scheduled to come in 07/05/11 due to her scheduled

## 2011-05-17 NOTE — Telephone Encounter (Signed)
Refill for the proair has been sent to the pharmacy for the pt.  No further refills until next ov.  Cal offer appt to see SN on 4-30 at 10:30.

## 2011-05-17 NOTE — Telephone Encounter (Signed)
Pt last seen 07-21-09; please advise if okay to send refill for this time only for rescue inhaler and advise where to put pt for OV. Thanks.

## 2011-05-20 ENCOUNTER — Encounter: Payer: Self-pay | Admitting: *Deleted

## 2011-05-20 ENCOUNTER — Ambulatory Visit (INDEPENDENT_AMBULATORY_CARE_PROVIDER_SITE_OTHER): Payer: BC Managed Care – PPO | Admitting: Adult Health

## 2011-05-20 ENCOUNTER — Telehealth: Payer: Self-pay | Admitting: Pulmonary Disease

## 2011-05-20 ENCOUNTER — Other Ambulatory Visit (INDEPENDENT_AMBULATORY_CARE_PROVIDER_SITE_OTHER): Payer: BC Managed Care – PPO

## 2011-05-20 VITALS — BP 120/84 | HR 97 | Temp 96.9°F | Ht 62.0 in | Wt 180.8 lb

## 2011-05-20 DIAGNOSIS — J45909 Unspecified asthma, uncomplicated: Secondary | ICD-10-CM

## 2011-05-20 MED ORDER — PREDNISONE 10 MG PO TABS: ORAL_TABLET | ORAL | Status: DC

## 2011-05-20 MED ORDER — ALBUTEROL SULFATE (2.5 MG/3ML) 0.083% IN NEBU
2.5000 mg | INHALATION_SOLUTION | Freq: Once | RESPIRATORY_TRACT | Status: AC
  Administered 2011-05-20: 2.5 mg via RESPIRATORY_TRACT

## 2011-05-20 MED ORDER — CETIRIZINE HCL 10 MG PO TABS: 10.0000 mg | ORAL_TABLET | Freq: Every day | ORAL | Status: DC | PRN

## 2011-05-20 MED ORDER — FLUTICASONE-SALMETEROL 250-50 MCG/DOSE IN AEPB: 1.0000 | INHALATION_SPRAY | Freq: Two times a day (BID) | RESPIRATORY_TRACT | Status: DC

## 2011-05-20 NOTE — Progress Notes (Signed)
Addended by: Boone Master E on: 05/20/2011 05:06 PM   Modules accepted: Orders

## 2011-05-20 NOTE — Assessment & Plan Note (Signed)
Exacerbation   Plan:  Prednisone taper over the next week. Begin Zyrtec 10 mg at bedtime daily as needed for nasal drainage. Begin Advair 250/50 one puff twice daily. Followup with Dr. Kriste Basque in  2-3 months and as needed. Please contact office for sooner follow up if symptoms do not improve or worsen or seek emergency care

## 2011-05-20 NOTE — Patient Instructions (Addendum)
Prednisone taper over the next week. Begin Zyrtec 10 mg at bedtime daily as needed for nasal drainage. Begin Advair 250/50 one puff twice daily. Ventolin HFA 2 puffs every 4 hr As needed  Wheezing.  Followup with Dr. Kriste Basque in  2-3 months and as needed. Please contact office for sooner follow up if symptoms do not improve or worsen or seek emergency care

## 2011-05-20 NOTE — Telephone Encounter (Signed)
Called, spoke with pt who c/o wheezing a lot, increased SOB, some chest tightness, and nonprod cough which started on Saturday.  She is using albuterol inhaler 3-4 times daily and has used her son's albuterol nebulizer on one day.  She was last seen by SN on 01/2010 and does have a pending OV with him on 07/2011.  Requesting OV today or something to be called in.    Spoke with Katheren Shams.  Pt can be seen today at 3pm by TP -- pt ok with this appt.

## 2011-05-20 NOTE — Progress Notes (Signed)
Addended by: Boone Master E on: 05/20/2011 03:55 PM   Modules accepted: Orders

## 2011-05-20 NOTE — Progress Notes (Signed)
Subjective:    Patient ID: Amber Stephens, female    DOB: 1975/04/11, 36 y.o.   MRN: [id number]  HPI 36 y/o BF   05/20/2011 Acute OV  Complains of increased SOB, wheezing, dry cough x5 days.   pt reports not taking her adviar 250-4mcg -has been off advair -never took regularly.  No discolored mucus. No fever. Does have nasal drip and drainage.  Has been outside a lot lately with high pollen counts.       PMH:  ALLERGIC RHINITIS (ICD-477.9) - hx allergy testing yrs ago by drTBratton w/ +reactions to pollen, trees, grasses... she uses OTC antihist Prn...   ASTHMA (ICD-493.90) - she is a never smoker, hx asthma and AB x yrs... on PROAIR inhaler Prn and hasn't needed it in months she says... triggers include upper resp infections & springtime allergen exposures... occas requires Depo/ Pred (none for yrs)- she was on Vanceril inhaler in the 90's & stopped on her own... PFT 6/11 w/ mild persist asthma w/ obstruction on PFT >> we discussed starting ADVAIR 250- Bid regularly...  ~ baseline CXR showed clear, NAD...  ~ PFT 6/06 showed FVC=2.84 (100%), FEV1=2.00 (82%), %1sec=71, mid-flows=45%pred.  ~ PFT 6/11 showed FVC=2.28 (78%), FEV1=1.64 (66%), %1sec=72, mid-flows=38%pred.   Hx of PNEUMONIA (ICD-486) - hx LLL CAP in 1997 resolved w/ Cephalosporin Rx + ZPak...   Hx of GASTRITIS (ICD-535.50) - remote hx gastritis treated w/ H2Blockers at the time... denies dysphagia, heartburn, indigestion, n/v, abd pain, etc...  Health Maintenance: GYN= DrNeal et al... GI= DrStark yrs ago (she works at [employer])...      Review of Systems Constitutional:   No  weight loss, night sweats,  Fevers, chills, fatigue, or  lassitude.  HEENT:   No headaches,  Difficulty swallowing,  Tooth/dental problems, or  Sore throat,                No sneezing, itching, ear ache, nasal congestion, post nasal drip,   CV:  No chest pain,  Orthopnea, PND, swelling in lower extremities, anasarca, dizziness,  palpitations, syncope.   GI  No heartburn, indigestion, abdominal pain, nausea, vomiting, diarrhea, change in bowel habits, loss of appetite, bloody stools.   Resp: No shortness of breath with exertion or at rest.  No excess mucus, no productive cough,  No non-productive cough,  No coughing up of blood.  No change in color of mucus.  No wheezing.  No chest wall deformity  Skin: no rash or lesions.  GU: no dysuria, change in color of urine, no urgency or frequency.  No flank pain, no hematuria   MS:  No joint pain or swelling.  No decreased range of motion.  No back pain.  Psych:  No change in mood or affect. No depression or anxiety.  No memory loss.        Objective:   Physical Exam GEN: A/Ox3; pleasant , NAD, well nourished   HEENT:  /AT,  EACs-clear, TMs-wnl, NOSE-clear, THROAT-clear, no lesions, no postnasal drip or exudate noted.   NECK:  Supple w/ fair ROM; no JVD; normal carotid impulses w/o bruits; no thyromegaly or nodules palpated; no lymphadenopathy.  RESP  Exp wheezing. no accessory muscle use, no dullness to percussion  CARD:  RRR, no m/r/g  , no peripheral edema, pulses intact, no cyanosis or clubbing.  GI:   Soft & nt; nml bowel sounds; no organomegaly or masses detected.  Musco: Warm bil, no deformities or joint swelling noted.   Neuro:  alert, no focal deficits noted.    Skin: Warm, no lesions or rashes         Assessment & Plan:

## 2011-05-25 NOTE — Progress Notes (Signed)
Quick Note:  Pt aware of results. ______ 

## 2011-06-10 ENCOUNTER — Telehealth: Payer: Self-pay | Admitting: Pulmonary Disease

## 2011-06-10 MED ORDER — FLUTICASONE-SALMETEROL 250-50 MCG/DOSE IN AEPB: 1.0000 | INHALATION_SPRAY | Freq: Two times a day (BID) | RESPIRATORY_TRACT | Status: DC

## 2011-06-10 NOTE — Telephone Encounter (Signed)
I spoke with pt and she is needing her rx for advair sent into [facility]. I advised pt will send rx and nothing further was needed

## 2011-07-05 ENCOUNTER — Ambulatory Visit: Payer: Self-pay | Admitting: Pulmonary Disease

## 2011-08-16 ENCOUNTER — Ambulatory Visit (INDEPENDENT_AMBULATORY_CARE_PROVIDER_SITE_OTHER)
Admission: RE | Admit: 2011-08-16 | Discharge: 2011-08-16 | Disposition: A | Discharge status: Home / Self Care | Payer: BC Managed Care – PPO | Source: Ambulatory Visit | Attending: Pulmonary Disease | Admitting: Pulmonary Disease

## 2011-08-16 ENCOUNTER — Encounter: Payer: Self-pay | Admitting: Pulmonary Disease

## 2011-08-16 ENCOUNTER — Ambulatory Visit (INDEPENDENT_AMBULATORY_CARE_PROVIDER_SITE_OTHER): Payer: BC Managed Care – PPO | Admitting: Pulmonary Disease

## 2011-08-16 VITALS — BP 126/84 | HR 70 | Temp 97.6°F | Ht 62.0 in | Wt 177.2 lb

## 2011-08-16 DIAGNOSIS — J309 Allergic rhinitis, unspecified: Secondary | ICD-10-CM

## 2011-08-16 DIAGNOSIS — J45909 Unspecified asthma, uncomplicated: Secondary | ICD-10-CM

## 2011-08-16 DIAGNOSIS — Z0000 Encounter for general adult medical examination without abnormal findings: Secondary | ICD-10-CM

## 2011-08-16 DIAGNOSIS — K2970 Gastritis, unspecified, without bleeding: Secondary | ICD-10-CM

## 2011-08-16 DIAGNOSIS — K2990 Gastroduodenitis, unspecified, without bleeding: Secondary | ICD-10-CM

## 2011-08-16 MED ORDER — ALBUTEROL SULFATE HFA 108 (90 BASE) MCG/ACT IN AERS: INHALATION_SPRAY | RESPIRATORY_TRACT | Status: DC

## 2011-08-16 MED ORDER — FLUTICASONE-SALMETEROL 250-50 MCG/DOSE IN AEPB: 1.0000 | INHALATION_SPRAY | Freq: Two times a day (BID) | RESPIRATORY_TRACT | Status: DC

## 2011-08-16 NOTE — Progress Notes (Signed)
Subjective:     Patient ID: Amber Stephens, female   DOB: [birth year], 36 y.o.   MRN: [id number]  HPI 38 y/o BF, daughter of Louretta Shorten, & mother of 4 children- here for a follow up visit...  ~  August 16, 2011:  2 year follow up/ CPX> Amber Stephens has had a good interval other than stress w/ 4 kids & mother's (Phyliss Roseanne Reno) early onset Dementia; she saw TP 4/13 for Asthma exac w/incr SOB, wheezing, dry cough- treated w/ restart of Advair + Pred taper and improved; she wonders if she needs the Advair & we discussed the nature of Asthma, how it leads to COPD if the inflamm is untreated, etc==> REC to take ADVAIR 250 Bid regularly & the [name] rescue inhaler...     We reviewed prob list, meds, xrays and labs> see below>> CXR 7/13 showed normal heart size, clear lungs, NAD.Marland KitchenMarland Kitchen EKG 7/13 showed NSR, rate66, WNL, NAD... LABS 7/13:  FLP- at goals on diet;  Chems- wnl;  CBC- anemic w/ Hg=10.9 MCV=81;  TSH=0.42;  UA- +blood cells likely from menses...   Problem List:    ALLERGIC RHINITIS (ICD-477.9) - hx allergy testing yrs ago by DrTBratton w/ +reactions to pollen, trees, grasses... she uses OTC antihist Prn...  ASTHMA (ICD-493.90) - she is a never smoker, hx asthma and AB x yrs... on PROAIR inhaler Prn... triggers include upper resp infections & springtime allergen exposures... occas requires Depo/ Pred (none for yrs until 4/13)- she was on Vanceril inhaler in the 90's & stopped on her own... PFT 6/11 w/ mild persist asthma w/ obstruction on PFT >> we discussed starting & staying on ADVAIR 250- Bid regularly... ~  baseline CXR showed clear, NAD... ~  PFT 6/06 showed FVC=2.84 (100%), FEV1=2.00 (82%), %1sec=71, mid-flows=45%pred. ~  PFT 6/11 showed FVC=2.28 (78%), FEV1=1.64 (66%), %1sec=72, mid-flows=38%pred. ~  4/13:  Asthma exac treated by TP w/ Pred & restart [id number]- asked to stay on this regularly... ~  CXR 7/13 showed normal heart size, clear lungs, NAD...  Hx of PNEUMONIA (ICD-486) - hx  LLL CAP in 1997 resolved w/ Cephalosporin Rx + ZPak...  Hx of GASTRITIS (ICD-535.50) - remote hx gastritis treated w/ H2Blockers at the time... denies dysphagia, heartburn, indigestion, n/v, abd pain, etc...  ANEMIA >> rec to take FeSO4 daily... ~  Labs 7/13 showed Hg= 10.9, MCV= 81... Needs daily Fe supplement...   Health Maintenance:  GYN= DrNeal et al... GI= DrStark yrs ago (she works at [employer])... prev labs here 2007 were all WNL including CBC, Chem, FLP, TSH...   Past Surgical History  Procedure Date  . Cesarean section   . Btl 10/10    Dr Rana Snare    Outpatient Encounter Prescriptions as of 08/16/2011  Medication Sig Dispense Refill  . albuterol (PROAIR HFA) 108 (90 BASE) MCG/ACT inhaler 1-2 puffs by mouth every 4 hours as needed  1 Inhaler  0  . cetirizine (ZYRTEC ALLERGY) 10 MG tablet Take 1 tablet (10 mg total) by mouth daily as needed for allergies.  30 tablet  3  . Norethindrone-Ethinyl Estradiol-Fe Biphas (LO LOESTRIN FE) 1 MG-10 MCG / 10 MCG tablet Take 1 tablet by mouth daily.      . Fluticasone-Salmeterol (ADVAIR DISKUS) 250-50 MCG/DOSE AEPB Inhale 1 puff into the lungs 2 (two) times daily.  60 each  5  . DISCONTD: predniSONE (DELTASONE) 10 MG tablet 4 tabs for 2 days, then 3 tabs for 2 days, 2 tabs for 2 days, then  1 tab for 2 days, then stop  20 tablet  0    No Known Allergies   Current Medications, Allergies, Past Medical History, Past Surgical History, Family History, and Social History were reviewed in [employer] record.   Review of Systems        See HPI - all other systems neg except as noted... The patient denies anorexia, fever, weight loss, weight gain, vision loss, decreased hearing, hoarseness, chest pain, syncope, dyspnea on exertion, peripheral edema, prolonged cough, headaches, hemoptysis, abdominal pain, melena, hematochezia, severe indigestion/heartburn, hematuria, incontinence, muscle weakness, suspicious skin lesions,  transient blindness, difficulty walking, depression, unusual weight change, abnormal bleeding, enlarged lymph nodes, and angioedema.     Objective:   Physical Exam     WD, WN, 36 y/o BF in NAD... GENERAL:  Alert & oriented; pleasant & cooperative... HEENT:  [place]/AT, EOM-wnl, PERRLA, Fundi-benign, EACs-clear, TMs-wnl, NOSE-clear, THROAT-clear & wnl. NECK:  Supple w/ full ROM; no JVD; normal carotid impulses w/o bruits; no thyromegaly or nodules palpated; no lymphadenopathy. CHEST:  Clear to P & A; without wheezes/ rales/ or rhonchi. HEART:  Regular Rhythm; without murmurs/ rubs/ or gallops. ABDOMEN:  Soft & nontender; normal bowel sounds; no organomegaly or masses detected. EXT: without deformities or arthritic changes; no varicose veins/ venous insuffic/ or edema. NEURO:  CN's intact; motor testing normal; sensory testing normal; gait normal & balance OK. DERM:  No lesions noted; no rash etc...  RADIOLOGY DATA:  Reviewed in the EPIC EMR & discussed w/ the patient...  LABORATORY DATA:  Reviewed in the EPIC EMR & discussed w/ the patient...   Assessment:     CPX>>  AR/ Asthma>  Once again asked to stay on a controller drug like ADVAIR 250Bid regularly & use the Proair HFA as needed...  Hx Gastritis>  Aware- no prob w/ chr reflux exacerbating her asthma etc; uses OTC Pepcid/ Zantac prn...  ANEMIA>  Hg= 10.9 w/ MCV=81 & rec to take FeSO4 daily...     Plan:     Patient's Medications  New Prescriptions   No medications on file  Previous Medications   CETIRIZINE (ZYRTEC ALLERGY) 10 MG TABLET    Take 1 tablet (10 mg total) by mouth daily as needed for allergies.   NORETHINDRONE-ETHINYL ESTRADIOL-FE BIPHAS (LO LOESTRIN FE) 1 MG-10 MCG / 10 MCG TABLET    Take 1 tablet by mouth daily.  Modified Medications   Modified Medication Previous Medication   ALBUTEROL (PROAIR HFA) 108 (90 BASE) MCG/ACT INHALER albuterol (PROAIR HFA) 108 (90 BASE) MCG/ACT inhaler      1-2 puffs by mouth every 4  hours as needed    1-2 puffs by mouth every 4 hours as needed   FLUTICASONE-SALMETEROL (ADVAIR DISKUS) 250-50 MCG/DOSE AEPB Fluticasone-Salmeterol (ADVAIR DISKUS) 250-50 MCG/DOSE AEPB      Inhale 1 puff into the lungs 2 (two) times daily.    Inhale 1 puff into the lungs 2 (two) times daily.  Discontinued Medications   PREDNISONE (DELTASONE) 10 MG TABLET    4 tabs for 2 days, then 3 tabs for 2 days, 2 tabs for 2 days, then 1 tab for 2 days, then stop

## 2011-08-16 NOTE — Patient Instructions (Addendum)
Today we updated your med list in our EPIC system...    Continue your current medications the same...    We refilled your ADVAIR & PROAIR as discussed...  Please return to our lab one morning this week for your FASTING blood work...    We will call you w/ the reports when avail...  Call for any questions...  Let's plan a follow up physical in 1 year's time.Marland KitchenMarland Kitchen

## 2011-08-19 ENCOUNTER — Other Ambulatory Visit (INDEPENDENT_AMBULATORY_CARE_PROVIDER_SITE_OTHER): Payer: BC Managed Care – PPO

## 2011-08-19 DIAGNOSIS — Z0000 Encounter for general adult medical examination without abnormal findings: Secondary | ICD-10-CM

## 2011-08-19 LAB — BASIC METABOLIC PANEL
Calcium: 8.8 mg/dL (ref 8.4–10.5)
Creatinine, Ser: 0.9 mg/dL (ref 0.4–1.2)
GFR: 90.6 mL/min (ref >60.00)
Sodium: 138 mEq/L (ref 135–145)

## 2011-08-19 LAB — CBC WITH DIFFERENTIAL/PLATELET
Basophils Absolute: 0.1 10*3/uL (ref 0.0–0.1)
Basophils Relative: 1.3 % (ref 0.0–3.0)
Eosinophils Absolute: 0.2 10*3/uL (ref 0.0–0.7)
HCT: 33.8 % — ABNORMAL LOW (ref 36.0–46.0)
Hemoglobin: 10.9 g/dL — ABNORMAL LOW (ref 12.0–15.0)
Lymphocytes Relative: 28.2 % (ref 12.0–46.0)
Lymphs Abs: 2 10*3/uL (ref 0.7–4.0)
MCHC: 32.4 g/dL (ref 30.0–36.0)
Monocytes Relative: 9.5 % (ref 3.0–12.0)
Neutro Abs: 4.1 10*3/uL (ref 1.4–7.7)
RBC: 4.17 Mil/uL (ref 3.87–5.11)
RDW: 18.2 % — ABNORMAL HIGH (ref 11.5–14.6)

## 2011-08-19 LAB — LIPID PANEL
HDL: 46.1 mg/dL (ref >39.00)
LDL Cholesterol: 93 mg/dL (ref 0–99)
Total CHOL/HDL Ratio: 3
Triglycerides: 60 mg/dL (ref 0.0–149.0)
VLDL: 12 mg/dL (ref 0.0–40.0)

## 2011-08-19 LAB — URINALYSIS, ROUTINE W REFLEX MICROSCOPIC
Ketones, ur: NEGATIVE
Specific Gravity, Urine: 1.02 (ref 1.000–1.030)
Total Protein, Urine: NEGATIVE
Urine Glucose: NEGATIVE
Urobilinogen, UA: 0.2 (ref 0.0–1.0)

## 2011-08-19 LAB — HEPATIC FUNCTION PANEL
Alkaline Phosphatase: 34 U/L — ABNORMAL LOW (ref 39–117)
Bilirubin, Direct: 0 mg/dL (ref 0.0–0.3)

## 2011-08-24 ENCOUNTER — Telehealth: Payer: Self-pay | Admitting: Pulmonary Disease

## 2011-08-24 NOTE — Telephone Encounter (Signed)
Notes Recorded by Michele Mcalpine, MD on 08/20/2011 at 7:46 AM Please notify patient>  FLP looks good on diet alone... Chems/ Thyroid- WNL.Marland KitchenMarland Kitchen CBC w/ mild anemia... Needs OTC Fe one daily!!! UA is clear x +blood- likely from menses...   I spoke with patient about results and she verbalized understanding and had no questions

## 2011-11-08 ENCOUNTER — Telehealth: Payer: Self-pay | Admitting: Pulmonary Disease

## 2011-11-08 MED ORDER — PROMETHAZINE-CODEINE 6.25-10 MG/5ML PO SYRP: 5.0000 mL | ORAL_SOLUTION | ORAL | Status: DC | PRN

## 2011-11-08 MED ORDER — AZITHROMYCIN 250 MG PO TABS: ORAL_TABLET | ORAL | Status: DC

## 2011-11-08 NOTE — Telephone Encounter (Signed)
Per SN---ok to call in zpak # 1  Take as directed, phenergan expectorant with codeine #6 oz  1 tsp every 4 hours prn cough with no refills. thanks

## 2011-11-08 NOTE — Telephone Encounter (Signed)
I spoke with pt and is aware of SN recs. I have called rx's into the pharmacy for pt. Nothing further was needed

## 2011-11-08 NOTE — Telephone Encounter (Signed)
Pt c/o prod cough with clear mucus for approx 2 weeks.  Pt denies SOB , sinus drainage or wheezing.   Does report having some low grade fever.  Please advise. No Known Allergies

## 2012-05-15 ENCOUNTER — Telehealth: Payer: Self-pay | Admitting: Pulmonary Disease

## 2012-05-15 MED ORDER — FLUTICASONE-SALMETEROL 250-50 MCG/DOSE IN AEPB: 1.0000 | INHALATION_SPRAY | Freq: Two times a day (BID) | RESPIRATORY_TRACT | Status: DC

## 2012-05-15 MED ORDER — METHYLPREDNISOLONE 4 MG PO KIT: PACK | ORAL | Status: DC

## 2012-05-15 NOTE — Telephone Encounter (Signed)
Per SN----   Restart the advair 250  1 puff bid and call in medrol dosepack  #1  Take as directed.

## 2012-05-15 NOTE — Telephone Encounter (Signed)
Last OV 08/16/11. I spoke with the pt and she states she stopped her advair few months ago because she was feeling better. She states now she has been having increased wheezing, some chest tightness, and dry cough. Pt states she has been using her albuterol inhaler several times a day without relief. Please advise. Carron Curie, CMA No Known Allergies

## 2012-05-15 NOTE — Telephone Encounter (Signed)
Spoke with pt and notified of recs per SN She verbalized understanding Nothing further needed Rxs were sent to pharm

## 2012-05-15 NOTE — Telephone Encounter (Signed)
LMTCB x 1 

## 2012-07-02 HISTORY — PX: DILATION AND CURETTAGE OF UTERUS: SHX78

## 2012-07-07 ENCOUNTER — Other Ambulatory Visit: Payer: Self-pay | Admitting: Obstetrics & Gynecology

## 2012-09-25 ENCOUNTER — Encounter: Payer: BC Managed Care – PPO | Admitting: Pulmonary Disease

## 2012-10-30 ENCOUNTER — Other Ambulatory Visit (INDEPENDENT_AMBULATORY_CARE_PROVIDER_SITE_OTHER): Payer: BC Managed Care – PPO

## 2012-10-30 ENCOUNTER — Encounter: Payer: Self-pay | Admitting: Pulmonary Disease

## 2012-10-30 ENCOUNTER — Ambulatory Visit (INDEPENDENT_AMBULATORY_CARE_PROVIDER_SITE_OTHER): Payer: BC Managed Care – PPO | Admitting: Pulmonary Disease

## 2012-10-30 VITALS — BP 120/90 | HR 70 | Temp 97.4°F | Ht 62.0 in | Wt 182.0 lb

## 2012-10-30 DIAGNOSIS — J45909 Unspecified asthma, uncomplicated: Secondary | ICD-10-CM

## 2012-10-30 DIAGNOSIS — Z0000 Encounter for general adult medical examination without abnormal findings: Secondary | ICD-10-CM | POA: Insufficient documentation

## 2012-10-30 DIAGNOSIS — J309 Allergic rhinitis, unspecified: Secondary | ICD-10-CM

## 2012-10-30 DIAGNOSIS — K2970 Gastritis, unspecified, without bleeding: Secondary | ICD-10-CM

## 2012-10-30 LAB — CBC WITH DIFFERENTIAL/PLATELET
Basophils Absolute: 0 10*3/uL (ref 0.0–0.1)
Basophils Relative: 0.5 % (ref 0.0–3.0)
Eosinophils Absolute: 0.2 10*3/uL (ref 0.0–0.7)
Hemoglobin: 12.6 g/dL (ref 12.0–15.0)
Lymphocytes Relative: 33.3 % (ref 12.0–46.0)
MCHC: 33.9 g/dL (ref 30.0–36.0)
MCV: 88.2 fl (ref 78.0–100.0)
Monocytes Absolute: 0.6 10*3/uL (ref 0.1–1.0)
Monocytes Relative: 8.6 % (ref 3.0–12.0)
Neutrophils Relative %: 54.5 % (ref 43.0–77.0)
Platelets: 355 10*3/uL (ref 150.0–400.0)
RBC: 4.22 Mil/uL (ref 3.87–5.11)
RDW: 12.8 % (ref 11.5–14.6)

## 2012-10-30 LAB — IBC PANEL: Iron: 106 ug/dL (ref 42–145)

## 2012-10-30 LAB — BASIC METABOLIC PANEL
CO2: 29 mEq/L (ref 19–32)
Calcium: 9.2 mg/dL (ref 8.4–10.5)
Chloride: 106 mEq/L (ref 96–112)
Glucose, Bld: 85 mg/dL (ref 70–99)
Potassium: 4.4 mEq/L (ref 3.5–5.1)
Sodium: 139 mEq/L (ref 135–145)

## 2012-10-30 LAB — LIPID PANEL
Cholesterol: 138 mg/dL (ref 0–200)
LDL Cholesterol: 83 mg/dL (ref 0–99)
Total CHOL/HDL Ratio: 3

## 2012-10-30 LAB — HEPATIC FUNCTION PANEL
ALT: 17 U/L (ref 0–35)
AST: 17 U/L (ref 0–37)
Albumin: 3.9 g/dL (ref 3.5–5.2)
Alkaline Phosphatase: 38 U/L — ABNORMAL LOW (ref 39–117)
Bilirubin, Direct: 0.1 mg/dL (ref 0.0–0.3)
Total Bilirubin: 0.6 mg/dL (ref 0.3–1.2)

## 2012-10-30 NOTE — Patient Instructions (Addendum)
Today we updated your med list in our EPIC system...    Continue your current medications the same...  Today we did your follow up FASTING blood work...    We will contact you w/ the results when available...   Call for any questions...  Let's plan a follow up visit in 1yr, sooner if needed for problems...   

## 2012-10-30 NOTE — Progress Notes (Signed)
Subjective:     Patient ID: Amber Stephens, female   DOB: 1975/12/16, 37 y.o.   MRN: [id number]  HPI 53 y/o BF, daughter of Amber Stephens, & mother of 4 children- here for a follow up visit...  ~  August 16, 2011:  2 year follow up/ CPX> Amber Stephens has had a good interval other than stress w/ 4 kids & mother's (Amber Stephens) early onset Dementia; she saw TP 4/13 for Asthma exac w/incr SOB, wheezing, dry cough- treated w/ restart of Advair + Pred taper and improved; she wonders if she needs the Advair & we discussed the nature of Asthma, how it leads to COPD if the inflamm is untreated, etc==> REC to take ADVAIR 250 Bid regularly & [facility] rescue [facility]er...     We reviewed prob list, meds, xrays and labs> see below>> CXR 7/13 showed normal heart size, clear lungs, NAD.Marland KitchenMarland Kitchen EKG 7/13 showed NSR, rate66, WNL, NAD... LABS 7/13:  FLP- at goals on diet;  Chems- wnl;  CBC- anemic w/ Hg=10.9 MCV=81;  TSH=0.42;  UA- +blood cells likely from menses...   ~  October 30, 2012:  54mo ROV & CPX> Amber Stephens has had a good yr- no new medical complaints or concerns, she is under incr stress w/ mother's illness and care needs...    AR, Asthma> on [phone] but only using it once daily, AlbutHFA prn (hasn't needed); no resp exac, breathing well w/o cough, sput, SOB, wheezing, CP, etc...    Hx Gastritis> uses OTC Prilosec20 or Zantac as needed; denioes abd pain, dysphagia, n/v, c/d, blood seen...    Anemia> prev Hg was 10-11 range & she took some Iron but stopped on he3r own; Labs today showed Hg=12.6 w/ Fe=107...     Stress> under stress w/ care of her mother w/ dementia; she does not require anxiolytic meds... We reviewed prob list, meds, xrays and labs> see below for updates >> OK 2014 Flu vaccine today... LABS 9/14:  FLP- at goals on diet alone;  Chems- wnl;  CBC- ok w/ Hg=12.6;  Fe=106 (29%);  TSH=0.65...    Problem List:    ALLERGIC RHINITIS (ICD-477.9) - hx allergy testing yrs ago by Amber Stephens w/  +reactions to pollen, trees, grasses... she uses OTC antihist Prn...  ASTHMA (ICD-493.90) - she is a never smoker, hx asthma and AB x yrs... on PROAIR inhaler Prn... triggers include upper resp infections & springtime allergen exposures... occas requires Depo/ Pred (none for yrs until 4/13)- she was on Vanceril inhaler in the 90's & stopped on her own... PFT 6/11 w/ mild persist asthma w/ obstruction on PFT >> we discussed starting & staying on ADVAIR 250- Bid regularly... ~  baseline CXR showed clear, NAD... ~  PFT 6/06 showed FVC=2.84 (100%), FEV1=2.00 (82%), %1sec=71, mid-flows=45%pred. ~  PFT 6/11 showed FVC=2.28 (78%), FEV1=1.64 (66%), %1sec=72, mid-flows=38%pred. ~  4/13:  Asthma exac treated by TP w/ Pred & restart [id number]- asked to stay on this regularly... ~  CXR 7/13 showed normal heart size, clear lungs, NAD... ~  9/14: stable on Advair250 once daily & hasn't needed the rescue inhaler...  Hx of PNEUMONIA (ICD-486) - hx LLL CAP in 1997 resolved w/ Cephalosporin Rx + ZPak...  Hx of GASTRITIS (ICD-535.50) - remote hx gastritis treated w/ H2Blockers at the time... denies dysphagia, heartburn, indigestion, n/v, abd pain, etc...  ANEMIA >> rec to take FeSO4 daily... ~  Labs 7/13 showed Hg= 10.9, MCV= 81... Needs daily Fe supplement...  ~  Labs 9/14 showed  Hg=12.6;  Fe=106 (29%)  Health Maintenance:  GYN= Amber Stephens et al... GI= Amber Stephens yrs ago (she works at [employer])... prev labs here 2007 were all WNL including CBC, Chem, FLP, TSH...   Past Surgical History  Procedure Laterality Date  . Cesarean section    . Btl  10/10    Dr Amber Stephens  . Dilation and curettage of uterus  07/02/2012    Outpatient Encounter Prescriptions as of 10/30/2012  Medication Sig Dispense Refill  . albuterol (PROAIR HFA) 108 (90 BASE) MCG/ACT inhaler 1-2 puffs by mouth every 4 hours as needed  1 Inhaler  6  . Fluticasone-Salmeterol (ADVAIR) 250-50 MCG/DOSE AEPB Inhale 1 puff into the lungs daily.      .  [DISCONTINUED] Fluticasone-Salmeterol (ADVAIR DISKUS) 250-50 MCG/DOSE AEPB Inhale 1 puff into the lungs 2 (two) times daily.  60 each  6  . [DISCONTINUED] azithromycin (ZITHROMAX) 250 MG tablet Take as directed  6 tablet  0  . [DISCONTINUED] cetirizine (ZYRTEC ALLERGY) 10 MG tablet Take 1 tablet (10 mg total) by mouth daily as needed for allergies.  30 tablet  3  . [DISCONTINUED] methylPREDNISolone (MEDROL, PAK,) 4 MG tablet follow package directions  21 tablet  0  . [DISCONTINUED] Norethindrone-Ethinyl Estradiol-Fe Biphas (LO LOESTRIN FE) 1 MG-10 MCG / 10 MCG tablet Take 1 tablet by mouth daily.      . [DISCONTINUED] promethazine-codeine (PHENERGAN WITH CODEINE) 6.25-10 MG/5ML syrup Take 5 mLs by mouth every 4 (four) hours as needed for cough.  180 mL  0   No facility-administered encounter medications on file as of 10/30/2012.    No Known Allergies   Current Medications, Allergies, Past Medical History, Past Surgical History, Family History, and Social History were reviewed in [employer] record.   Review of Systems         See HPI - all other systems neg except as noted... The patient denies anorexia, fever, weight loss, weight gain, vision loss, decreased hearing, hoarseness, chest pain, syncope, dyspnea on exertion, peripheral edema, prolonged cough, headaches, hemoptysis, abdominal pain, melena, hematochezia, severe indigestion/heartburn, hematuria, incontinence, muscle weakness, suspicious skin lesions, transient blindness, difficulty walking, depression, unusual weight change, abnormal bleeding, enlarged lymph nodes, and angioedema.     Objective:   Physical Exam      WD, WN, 37 y/o BF in NAD... GENERAL:  Alert & oriented; pleasant & cooperative... HEENT:  [place]/AT, EOM-wnl, PERRLA, Fundi-benign, EACs-clear, TMs-wnl, NOSE-clear, THROAT-clear & wnl. NECK:  Supple w/ full ROM; no JVD; normal carotid impulses w/o bruits; no thyromegaly or nodules palpated; no  lymphadenopathy. CHEST:  Clear to P & A; without wheezes/ rales/ or rhonchi. HEART:  Regular Rhythm; without murmurs/ rubs/ or gallops. ABDOMEN:  Soft & nontender; normal bowel sounds; no organomegaly or masses detected. EXT: without deformities or arthritic changes; no varicose veins/ venous insuffic/ or edema. NEURO:  CN's intact; motor testing normal; sensory testing normal; gait normal & balance OK. DERM:  No lesions noted; no rash etc...  RADIOLOGY DATA:  Reviewed in the EPIC EMR & discussed w/ the patient...  LABORATORY DATA:  Reviewed in the EPIC EMR & discussed w/ the patient...   Assessment:     CPX>>  AR/ Asthma>  Once again asked to stay on a controller drug like ADVAIR 250Bid regularly & use the Proair HFA as needed...  Hx Gastritis>  Aware- no prob w/ chr reflux exacerbating her asthma etc; uses OTC Pepcid/ Zantac prn...  ANEMIA>  Hg= 12.6 w/ MCV=88 &  Fe releted to 107...     Plan:     Patient's Medications  New Prescriptions   No medications on file  Previous Medications   ALBUTEROL (PROAIR HFA) 108 (90 BASE) MCG/ACT INHALER    1-2 puffs by mouth every 4 hours as needed  Modified Medications   Modified Medication Previous Medication   FLUTICASONE-SALMETEROL (ADVAIR) 250-50 MCG/DOSE AEPB Fluticasone-Salmeterol (ADVAIR DISKUS) 250-50 MCG/DOSE AEPB      Inhale 1 puff into the lungs daily.    Inhale 1 puff into the lungs 2 (two) times daily.  Discontinued Medications   AZITHROMYCIN (ZITHROMAX) 250 MG TABLET    Take as directed   CETIRIZINE (ZYRTEC ALLERGY) 10 MG TABLET    Take 1 tablet (10 mg total) by mouth daily as needed for allergies.   METHYLPREDNISOLONE (MEDROL, PAK,) 4 MG TABLET    follow package directions   NORETHINDRONE-ETHINYL ESTRADIOL-FE BIPHAS (LO LOESTRIN FE) 1 MG-10 MCG / 10 MCG TABLET    Take 1 tablet by mouth daily.   PROMETHAZINE-CODEINE (PHENERGAN WITH CODEINE) 6.25-10 MG/5ML SYRUP    Take 5 mLs by mouth every 4 (four) hours as needed for cough.

## 2012-11-01 NOTE — Progress Notes (Signed)
Quick Note:  Advised pt of lab results per SN. Pt verbalized understanding and has no further questions at this time ______

## 2013-10-30 ENCOUNTER — Ambulatory Visit: Payer: BC Managed Care – PPO | Admitting: Pulmonary Disease

## 2013-11-09 ENCOUNTER — Ambulatory Visit: Payer: BC Managed Care – PPO | Admitting: Pulmonary Disease

## 2013-12-03 ENCOUNTER — Other Ambulatory Visit (INDEPENDENT_AMBULATORY_CARE_PROVIDER_SITE_OTHER): Payer: BC Managed Care – PPO

## 2013-12-03 ENCOUNTER — Ambulatory Visit (INDEPENDENT_AMBULATORY_CARE_PROVIDER_SITE_OTHER): Payer: BC Managed Care – PPO | Admitting: Pulmonary Disease

## 2013-12-03 ENCOUNTER — Encounter: Payer: Self-pay | Admitting: Pulmonary Disease

## 2013-12-03 ENCOUNTER — Ambulatory Visit (INDEPENDENT_AMBULATORY_CARE_PROVIDER_SITE_OTHER)
Admission: RE | Admit: 2013-12-03 | Discharge: 2013-12-03 | Disposition: A | Discharge status: Home / Self Care | Payer: BC Managed Care – PPO | Source: Ambulatory Visit | Attending: Pulmonary Disease | Admitting: Pulmonary Disease

## 2013-12-03 VITALS — BP 124/84 | HR 62 | Temp 98.5°F | Ht 62.0 in | Wt 190.2 lb

## 2013-12-03 DIAGNOSIS — Z0000 Encounter for general adult medical examination without abnormal findings: Secondary | ICD-10-CM

## 2013-12-03 DIAGNOSIS — J4520 Mild intermittent asthma, uncomplicated: Secondary | ICD-10-CM

## 2013-12-03 DIAGNOSIS — J302 Other seasonal allergic rhinitis: Secondary | ICD-10-CM

## 2013-12-03 DIAGNOSIS — Z23 Encounter for immunization: Secondary | ICD-10-CM

## 2013-12-03 LAB — BASIC METABOLIC PANEL
BUN: 12 mg/dL (ref 6–23)
CALCIUM: 9.6 mg/dL (ref 8.4–10.5)
CO2: 23 mEq/L (ref 19–32)
Chloride: 104 mEq/L (ref 96–112)
Creatinine, Ser: 1 mg/dL (ref 0.4–1.2)
GFR: 79.25 mL/min (ref >60.00)
GLUCOSE: 84 mg/dL (ref 70–99)
POTASSIUM: 4 meq/L (ref 3.5–5.1)
SODIUM: 137 meq/L (ref 135–145)

## 2013-12-03 LAB — LIPID PANEL
CHOLESTEROL: 161 mg/dL (ref 0–200)
HDL: 46.7 mg/dL (ref >39.00)
LDL CALC: 99 mg/dL (ref 0–99)
NonHDL: 114.3
Total CHOL/HDL Ratio: 3
Triglycerides: 77 mg/dL (ref 0.0–149.0)
VLDL: 15.4 mg/dL (ref 0.0–40.0)

## 2013-12-03 LAB — CBC WITH DIFFERENTIAL/PLATELET
Basophils Absolute: 0.1 10*3/uL (ref 0.0–0.1)
Basophils Relative: 0.8 % (ref 0.0–3.0)
EOS ABS: 0.3 10*3/uL (ref 0.0–0.7)
Eosinophils Relative: 4.4 % (ref 0.0–5.0)
HEMATOCRIT: 41.2 % (ref 36.0–46.0)
HEMOGLOBIN: 13.6 g/dL (ref 12.0–15.0)
Lymphocytes Relative: 36.8 % (ref 12.0–46.0)
Lymphs Abs: 2.9 10*3/uL (ref 0.7–4.0)
MCHC: 33 g/dL (ref 30.0–36.0)
MCV: 89.6 fl (ref 78.0–100.0)
MONO ABS: 0.6 10*3/uL (ref 0.1–1.0)
Monocytes Relative: 7 % (ref 3.0–12.0)
NEUTROS ABS: 4.1 10*3/uL (ref 1.4–7.7)
NEUTROS PCT: 51 % (ref 43.0–77.0)
Platelets: 391 10*3/uL (ref 150.0–400.0)
RBC: 4.59 Mil/uL (ref 3.87–5.11)
RDW: 13.5 % (ref 11.5–15.5)
WBC: 8 10*3/uL (ref 4.0–10.5)

## 2013-12-03 LAB — HEPATIC FUNCTION PANEL
ALBUMIN: 3.7 g/dL (ref 3.5–5.2)
ALT: 18 U/L (ref 0–35)
AST: 20 U/L (ref 0–37)
Alkaline Phosphatase: 45 U/L (ref 39–117)
BILIRUBIN TOTAL: 0.6 mg/dL (ref 0.2–1.2)
Bilirubin, Direct: 0.1 mg/dL (ref 0.0–0.3)
Total Protein: 8 g/dL (ref 6.0–8.3)

## 2013-12-03 NOTE — Patient Instructions (Signed)
Today we updated your med list in our EPIC system...     We decided to leave off the Advair for now & keep the [facility] handy as a "rescue" inhaler for as needed use...  Today we did your follow up CXR, EKG, & FASTING blood work...    We will contact you w/ the results when available...   Let's get on track w/ our diet & exercise program>    The goal is to lose 10-15 lbs...  We gave you the combination Tetanus vaccine today called the TDAP- it should be good for 10 yrs...  Call for any questions...  Let's plan a follow up visit in 62yr, sooner if needed for problems.Marland KitchenMarland Kitchen

## 2013-12-03 NOTE — Progress Notes (Signed)
Subjective:     Patient ID: Amber Stephens, female   DOB: [birth year], 38 y.o.   MRN: [id number]  HPI 74 y/o BF, daughter of [name], & mother of 4 children- here for a follow up visit...  ~  August 16, 2011:  2 year follow up/ CPX> Amber Stephens has had a good interval other than stress w/ 4 kids & mother's (Phyliss Nicole Kindred) early onset Dementia; she saw TP 4/13 for Asthma exac w/incr SOB, wheezing, dry cough- treated w/ restart of Advair + Pred taper and improved; she wonders if she needs the Advair & we discussed the nature of Asthma, how it leads to COPD if the inflamm is untreated, etc==> REC to take ADVAIR 250 Bid regularly & the [facility] rescue inhaler...     We reviewed prob list, meds, xrays and labs> see below>>  CXR 7/13 showed normal heart size, clear lungs, NAD.Marland KitchenMarland Kitchen  EKG 7/13 showed NSR, rate66, WNL, NAD...  LABS 7/13:  FLP- at goals on diet;  Chems- wnl;  CBC- anemic w/ Hg=10.9 MCV=81;  TSH=0.42;  UA- +blood cells likely from menses...   ~  October 30, 2012:  75mo ROV & CPX> Amber Stephens has had a good yr- no new medical complaints or concerns, she is under incr stress w/ mother's illness and care needs...    AR, Asthma> on [phone] but only using it once daily, AlbutHFA prn (hasn't needed); no resp exac, breathing well w/o cough, sput, SOB, wheezing, CP, etc...    Hx Gastritis> uses OTC Prilosec20 or Zantac as needed; denioes abd pain, dysphagia, n/v, c/d, blood seen...    Anemia> prev Hg was 10-11 range & she took some Iron but stopped on he3r own; Labs today showed Hg=12.6 w/ Fe=107...     Stress> under stress w/ care of her mother w/ dementia; she does not require anxiolytic meds... We reviewed prob list, meds, xrays and labs> see below for updates >> OK 2014 Flu vaccine today...  LABS 9/14:  FLP- at goals on diet alone;  Chems- wnl;  CBC- ok w/ Hg=12.6;  Fe=106 (29%);  TSH=0.65...  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 We reviewed the following medical problems during today's office visit >>     AR, Asthma> prev on [phone] but was using it once daily until she ran out 43mo ago, AlbutHFA prn (hasn't needed); no resp exac, breathing well w/o cough, sput, SOB, wheezing, CP, etc; therefore OK to stop the Advair for now...    Hx Gastritis> uses OTC Prilosec20 or Zantac as needed; denies abd pain, dysphagia, n/v, c/d, blood seen...    Anemia> prev Hg was 10-11 range & she took some Iron but stopped on her own; Labs today showed Hg=13.6 w/ MCV=90...     Stress> under stress w/ 4 kids & care of her mother w/ dementia; she does not require anxiolytic meds... We reviewed prob list, meds, xrays and labs> see below for updates >>   CXR 11/15 showed normal heart size, clear lungs, NAD...  LABS 11/15:  FLP- at goals on diet alone;  Chems- wnl;  CBC- wnl;  TSH=1.53.Marland KitchenMarland Kitchen  Problem List:    ALLERGIC RHINITIS (ICD-477.9) - hx allergy testing yrs ago by DrTBratton w/ +reactions to pollen, trees, grasses... she uses OTC antihist Prn...  ASTHMA (ICD-493.90) - she is a never smoker, hx asthma and AB x yrs... on PROAIR inhaler Prn... triggers include upper resp infections & springtime allergen exposures... occas requires Depo/ Pred (none for yrs until 4/13)- she was on Vanceril inhaler in the 90's & stopped on her own... PFT 6/11 w/ mild persist asthma w/ obstruction on PFT >> we discussed starting & staying on ADVAIR 250- Bid regularly... ~  baseline CXR showed clear, NAD... ~  PFT 6/06 showed FVC=2.84 (100%), FEV1=2.00 (82%), %1sec=71, mid-flows=45%pred. ~  PFT 6/11 showed FVC=2.28 (78%), FEV1=1.64 (66%), %1sec=72, mid-flows=38%pred. ~  4/13:  Asthma exac  treated by TP w/ Pred & restart [id number]- asked to stay on this regularly... ~  CXR 7/13 showed normal heart size, clear lungs, NAD... ~  9/14: stable on Advair250 once daily & hasn't needed the rescue inhaler... ~  11/15: stable off the Advair now for >58mo & we decided to leave it off for now & use Proair HFA rescue inhaler as needed... ~  CXR 11/15 showed normal heart size, clear lungs, NAD...  Hx of PNEUMONIA (ICD-486) - hx LLL CAP in 1997 resolved w/ Cephalosporin Rx + ZPak...  Hx of GASTRITIS (ICD-535.50) - remote hx gastritis treated w/ H2Blockers at the time... denies dysphagia, heartburn, indigestion, n/v, abd pain, etc...  ANEMIA >> rec to take FeSO4 daily... ~  Labs 7/13 showed Hg= 10.9, MCV= 81... Needs daily Fe supplement...  ~  Labs 9/14 showed Hg= 12.6;  Fe=106 (29%) ~  Labs 11/15 showed Hg= 13.6  Health Maintenance:  GYN= DrNeal et al... GI= DrStark yrs ago (she works at [employer])...  ~  Immuniz:  She gets the yearly Flu shot at work... Given TDAP 11/15 here...    Past Surgical History  Procedure Laterality Date  . Cesarean section    . Btl  10/10    Dr Corinna Capra  . Dilation and curettage of uterus  07/02/2012    Outpatient Encounter Prescriptions as of 12/03/2013  Medication Sig  . albuterol (PROAIR HFA) 108 (90 BASE) MCG/ACT inhaler 1-2 puffs by mouth every 4 hours as needed  . Fluticasone-Salmeterol (ADVAIR) 250-50 MCG/DOSE AEPB Inhale 1 puff into the lungs daily.    No Known Allergies   Current Medications, Allergies, Past Medical History, Past Surgical History, Family History, and Social History were reviewed in [employer] record.   Review of Systems        See HPI - all other systems neg except as noted... The patient denies anorexia, fever, weight loss, weight gain, vision loss, decreased hearing, hoarseness, chest pain, syncope, dyspnea on exertion, peripheral edema, prolonged cough, headaches, hemoptysis, abdominal pain,  melena, hematochezia, severe indigestion/heartburn, hematuria, incontinence, muscle weakness, suspicious skin lesions, transient blindness, difficulty walking, depression, unusual weight change, abnormal bleeding, enlarged lymph nodes, and angioedema.     Objective:   Physical Exam     WD, WN, 38 y/o BF in NAD... GENERAL:  Alert & oriented; pleasant & cooperative... HEENT:  [place]/AT, EOM-wnl, PERRLA, Fundi-benign, EACs-clear, TMs-wnl, NOSE-clear, THROAT-clear & wnl. NECK:  Supple w/ full ROM; no JVD; normal carotid impulses w/o bruits; no thyromegaly or nodules palpated; no lymphadenopathy. CHEST:  Clear to P & A; without wheezes/ rales/ or rhonchi. HEART:  Regular Rhythm; without murmurs/ rubs/ or gallops. ABDOMEN:  Soft & nontender; normal bowel  sounds; no organomegaly or masses detected. EXT: without deformities or arthritic changes; no varicose veins/ venous insuffic/ or edema. NEURO:  CN's intact; motor testing normal; sensory testing normal; gait normal & balance OK. DERM:  No lesions noted; no rash etc...  RADIOLOGY DATA:  Reviewed in the EPIC EMR & discussed w/ the patient...  LABORATORY DATA:  Reviewed in the EPIC EMR & discussed w/ the patient...   Assessment:     CPX>>  AR/ Asthma>  Once again asked to stay on a controller drug like ADVAIR 250Bid regularly & use the Proair HFA as needed...  Hx Gastritis>  Aware- no prob w/ chr reflux exacerbating her asthma etc; uses OTC Pepcid/ Zantac prn...  ANEMIA>  Hg= 13.6 w/ MCV=90...     Plan:     Patient's Medications  New Prescriptions   No medications on file  Previous Medications   ALBUTEROL (PROAIR HFA) 108 (90 BASE) MCG/ACT INHALER    1-2 puffs by mouth every 4 hours as needed  Modified Medications   No medications on file  Discontinued Medications   FLUTICASONE-SALMETEROL (ADVAIR) 250-50 MCG/DOSE AEPB    Inhale 1 puff into the lungs daily.

## 2013-12-04 LAB — TSH: TSH: 1.53 u[IU]/mL (ref 0.35–4.50)

## 2014-02-12 ENCOUNTER — Telehealth: Payer: Self-pay | Admitting: Pulmonary Disease

## 2014-02-12 MED ORDER — PROMETHAZINE-CODEINE 6.25-10 MG/5ML PO SYRP: 5.0000 mL | ORAL_SOLUTION | Freq: Four times a day (QID) | ORAL | Status: DC | PRN

## 2014-02-12 NOTE — Telephone Encounter (Signed)
Per SN   The only one that we can call in is phenergan with codeine, or she can come and pick up rx for hycodan.  Called and spoke with pt and she requested that the promethazine be called to her pharmacy.  This has been done and pt is aware---her pharmacy has been updated

## 2014-02-12 NOTE — Telephone Encounter (Signed)
Spoke with pt. Reports dry cough x1 week. Denies SOB or chest tightness. Has been using cough drops and OTC cough syrup with no relief. Would like a cough syrup called in.  No Known Allergies  SN - please advise. Thanks.

## 2014-02-13 ENCOUNTER — Telehealth: Payer: Self-pay | Admitting: Pulmonary Disease

## 2014-02-13 NOTE — Telephone Encounter (Signed)
Called and spoke with pharmacy and they stated that the pt was not in their system yesterday.  This information populated today and they will get the rx ready for the pt.  i called the pt and she is aware and nothing further is needed.

## 2014-05-09 ENCOUNTER — Telehealth: Payer: Self-pay | Admitting: Pulmonary Disease

## 2014-05-09 MED ORDER — ALBUTEROL SULFATE HFA 108 (90 BASE) MCG/ACT IN AERS: INHALATION_SPRAY | RESPIRATORY_TRACT | Status: DC

## 2014-05-09 NOTE — Telephone Encounter (Signed)
Called pt and aware RX sent in. Nothing further needed 

## 2014-05-20 ENCOUNTER — Telehealth: Payer: Self-pay | Admitting: Pulmonary Disease

## 2014-05-20 MED ORDER — ALBUTEROL SULFATE HFA 108 (90 BASE) MCG/ACT IN AERS: INHALATION_SPRAY | RESPIRATORY_TRACT | Status: DC

## 2014-05-20 MED ORDER — PREDNISONE 20 MG PO TABS: ORAL_TABLET | ORAL | Status: DC

## 2014-05-20 MED ORDER — FLUTICASONE-SALMETEROL 250-50 MCG/DOSE IN AEPB
1.0000 | INHALATION_SPRAY | Freq: Two times a day (BID) | RESPIRATORY_TRACT | Status: DC
Start: 2014-05-20 — End: 2016-01-08

## 2014-05-20 NOTE — Telephone Encounter (Signed)
Per Dr. Lenna Gilford:  Prednisone 20mg , 1/2 PO BID x 4 days, 1/2 QOD til gone; Refill Advair 250 1 BID; Refill Proair HFA.  Rx sent to pharmacy.  Patient notified. Nothing further needed.

## 2014-05-20 NOTE — Telephone Encounter (Signed)
Pt calling for sooner appt with SN - has appt scheduled 05/22/14 -- no appts available Pt having some chest discomfort along with increased wheezing.  Having to use albuterol hfa frequently and also using her son's nebulizer (albuterol) frequently.  Pt wanting to know what else she can do to help give some relief for her wheezing.   Please advise Dr Lenna Gilford. Thanks.

## 2014-05-22 ENCOUNTER — Ambulatory Visit: Payer: Self-pay | Admitting: Pulmonary Disease

## 2014-05-22 ENCOUNTER — Telehealth: Payer: Self-pay | Admitting: Pulmonary Disease

## 2014-05-22 NOTE — Telephone Encounter (Signed)
Per SN, prednisone taper that was called into pharmacy 05/21/14 should have had the instructions as follows: Prednisone 20mg  tablets # 16. 1 tablet BID x 4 days 1 tablet QD x  Days 1/2 tablet QD x 4 day 1/2 tablet QOD until done.  Instructions called in incorrectly. Pt was called today to check on how she was feeling. Pt cancelled appt for 05/22/14. Pt stated that she was feeling much better and that she had to cancel the appt due to her son but she rescheduled for next week. Since pt was feeling better, pt informed that SN was changing the instructions of the taper to: 1/2 tablet PO QD today and tomorrow and to skip dose on Friday and take 1/2 tab on Saturday.  Pt also informed to keep appt on 05/27/14 so SN can re-evaluate her and see if her symptoms are clear. Pt voiced understanding of the changed orders. Nothing further is needed at this time.

## 2014-05-27 ENCOUNTER — Encounter: Payer: Self-pay | Admitting: Pulmonary Disease

## 2014-05-27 ENCOUNTER — Ambulatory Visit (INDEPENDENT_AMBULATORY_CARE_PROVIDER_SITE_OTHER): Payer: BLUE CROSS/BLUE SHIELD | Admitting: Pulmonary Disease

## 2014-05-27 VITALS — BP 112/80 | HR 68 | Temp 97.4°F | Ht 62.0 in | Wt 193.8 lb

## 2014-05-27 DIAGNOSIS — J301 Allergic rhinitis due to pollen: Secondary | ICD-10-CM | POA: Diagnosis not present

## 2014-05-27 DIAGNOSIS — K2970 Gastritis, unspecified, without bleeding: Secondary | ICD-10-CM | POA: Diagnosis not present

## 2014-05-27 DIAGNOSIS — K2990 Gastroduodenitis, unspecified, without bleeding: Secondary | ICD-10-CM

## 2014-05-27 DIAGNOSIS — J4530 Mild persistent asthma, uncomplicated: Secondary | ICD-10-CM | POA: Diagnosis not present

## 2014-05-27 MED ORDER — FLUTICASONE-SALMETEROL 250-50 MCG/DOSE IN AEPB: 1.0000 | INHALATION_SPRAY | Freq: Two times a day (BID) | RESPIRATORY_TRACT | Status: DC

## 2014-05-27 NOTE — Progress Notes (Signed)
Subjective:     Patient ID: Amber Stephens, female   DOB: 03-19-1975, 39 y.o.   MRN: [id number]  HPI 67 y/o BF, daughter of Amber Stephens, & mother of 4 children- here for a follow up visit...  ~  August 16, 2011:  2 year follow up/ CPX> Amber Stephens has had a good interval other than stress w/ 4 kids & mother's (Amber Stephens) early onset Dementia; she saw TP 4/13 for Asthma exac w/incr SOB, wheezing, dry cough- treated w/ restart of Advair + Pred taper and improved; she wonders if she needs the Advair & we discussed the nature of Asthma, how it leads to COPD if the inflamm is untreated, etc==> REC to take ADVAIR 250 Bid regularly & the [facility] rescue inhaler...     We reviewed prob list, meds, xrays and labs> see below>>  CXR 7/13 showed normal heart size, clear lungs, NAD.Marland KitchenMarland Kitchen  EKG 7/13 showed NSR, rate66, WNL, NAD...  LABS 7/13:  FLP- at goals on diet;  Chems- wnl;  CBC- anemic w/ Hg=10.9 MCV=81;  TSH=0.42;  UA- +blood cells likely from menses...   ~  October 30, 2012:  44mo ROV & CPX> Amber Stephens has had a good yr- no new medical complaints or concerns, she is under incr stress w/ mother's illness and care needs...    AR, Asthma> on [phone] but only using it once daily, AlbutHFA prn (hasn't needed); no resp exac, breathing well w/o cough, sput, SOB, wheezing, CP, etc...    Hx Gastritis> uses OTC Prilosec20 or Zantac as needed; denioes abd pain, dysphagia, n/v, c/d, blood seen...    Anemia> prev Hg was 10-11 range & she took some Iron but stopped on he3r own; Labs today showed Hg=12.6 w/ Fe=107...     Stress> under stress w/ care of her mother w/ dementia; she does not require anxiolytic meds... We reviewed prob list, meds, xrays and labs> see below for updates >> OK 2014 Flu vaccine today...  LABS 9/14:  FLP- at goals on diet alone;  Chems- wnl;  CBC- ok w/ Hg=12.6;  Fe=106 (29%);  TSH=0.65...  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 We reviewed the following medical problems during today's office visit >>     AR, Asthma> prev on [phone] but was using it once daily until she ran out 30mo ago, AlbutHFA prn (hasn't needed); no resp exac, breathing well w/o cough, sput, SOB, wheezing, CP, etc; therefore OK to stop the Advair for now...    Hx Gastritis> uses OTC Prilosec20 or Zantac as needed; denies abd pain, dysphagia, n/v, c/d, blood seen...    Anemia> prev Hg was 10-11 range & she took some Iron but stopped on her own; Labs today showed Hg=13.6 w/ MCV=90...     Stress> under stress w/ 4 kids & care of her mother w/ dementia; she does not require anxiolytic meds... We reviewed prob list, meds, xrays and labs> see below for updates >>   CXR 11/15 showed normal heart size, clear lungs, NAD...  LABS 11/15:  FLP- at goals on diet alone;  Chems- wnl;  CBC- wnl;  TSH=1.53...  ~  May 27, 2014:  27mo ROV & add-on appt for asthma exac w/ cough, SOB, wheezing;  She has hx AR/ Asthma but off her Advair250 since 10/15 as noted above;  Pt developed incr SOB, cough, wheezing in relation to the pollen this spring;  We called in Pred & refilled her Advair250 but it was too little too late- she reports sl better after using her sons NEB...  EXAM shows Afeb, VSS, O2sat=97% on RA at rest;  HEENT- sl congested nares;  Chest- mild end exp wheezing, scat rhonchi, no consolidation;  Heart- RR w/o m/r/g;  Ext- no c/c/e...  We decided bump up the Pred dose w/ slower taper, refill the Advair250Bid, plus Mucinex, Delsym, etc...     We reviewed prob list, meds, xrays and labs> see below for updates >>            Problem List:    ALLERGIC RHINITIS (ICD-477.9) - hx allergy testing yrs ago by  Amber Stephens w/ +reactions to pollen, trees, grasses... she uses OTC antihist Prn...  ASTHMA (ICD-493.90) - she is a never smoker, hx asthma and AB x yrs... on PROAIR inhaler Prn... triggers include upper resp infections & springtime allergen exposures... occas requires Depo/ Pred (none for yrs until 4/13)- she was on Vanceril inhaler in the 90's & stopped on her own... PFT 6/11 w/ mild persist asthma w/ obstruction on PFT >> we discussed starting & staying on ADVAIR 250- Bid regularly... ~  baseline CXR showed clear, NAD... ~  PFT 6/06 showed FVC=2.84 (100%), FEV1=2.00 (82%), %1sec=71, mid-flows=45%pred. ~  PFT 6/11 showed FVC=2.28 (78%), FEV1=1.64 (66%), %1sec=72, mid-flows=38%pred. ~  4/13:  Asthma exac treated by TP w/ Pred & restart [id number]- asked to stay on this regularly... ~  CXR 7/13 showed normal heart size, clear lungs, NAD... ~  9/14: stable on Advair250 once daily & hasn't needed the rescue inhaler... ~  11/15: stable off the Advair now for >13mo & we decided to leave it off for now & use Proair HFA rescue inhaler as needed... ~  CXR 11/15 showed normal heart size, clear lungs, NAD... ~  4/16: called w/ asthma exac due to the pollen etc; she has been off her Advair x88mo; Rx w/ Pred taper & restart the Advair250Bid...  Hx of PNEUMONIA (ICD-486) - hx LLL CAP in 1997 resolved w/ Cephalosporin Rx + ZPak...  Hx of GASTRITIS (ICD-535.50) - remote hx gastritis treated w/ H2Blockers at the time... denies dysphagia, heartburn, indigestion, n/v, abd pain, etc...  ANEMIA >> rec to take FeSO4 daily... ~  Labs 7/13 showed Hg= 10.9, MCV= 81... Needs daily Fe supplement...  ~  Labs 9/14 showed Hg= 12.6;  Fe=106 (29%) ~  Labs 11/15 showed Hg= 13.6  Health Maintenance:  GYN= Amber Stephens et al... GI= Amber Stephens yrs ago (she works at [employer])...  ~  Immuniz:  She gets the yearly Flu shot at work... Given TDAP 11/15 here...    Past Surgical History  Procedure Laterality Date  . Cesarean section     . Btl  10/10    Dr Amber Stephens  . Dilation and curettage of uterus  07/02/2012    Outpatient Encounter Prescriptions as of 05/27/2014  Medication Sig  . albuterol (PROAIR HFA) 108 (90 BASE) MCG/ACT inhaler 1-2 puffs by mouth every 4 hours as needed  . Fluticasone-Salmeterol (ADVAIR DISKUS) 250-50 MCG/DOSE AEPB Inhale 1 puff into the lungs 2 (two) times daily.  . predniSONE (DELTASONE) 20 MG tablet Take 1/2 tablet once daily x 4  days, then 1/2 tablets every other day til gone.  . promethazine-codeine (PHENERGAN WITH CODEINE) 6.25-10 MG/5ML syrup Take 5 mLs by mouth every 6 (six) hours as needed for cough.    No Known Allergies   Current Medications, Allergies, Past Medical History, Past Surgical History, Family History, and Social History were reviewed in [employer] record.   Review of Systems        See HPI - all other systems neg except as noted... The patient denies anorexia, fever, weight loss, weight gain, vision loss, decreased hearing, hoarseness, chest pain, syncope, dyspnea on exertion, peripheral edema, prolonged cough, headaches, hemoptysis, abdominal pain, melena, hematochezia, severe indigestion/heartburn, hematuria, incontinence, muscle weakness, suspicious skin lesions, transient blindness, difficulty walking, depression, unusual weight change, abnormal bleeding, enlarged lymph nodes, and angioedema.     Objective:   Physical Exam     WD, WN, 39 y/o BF in NAD... GENERAL:  Alert & oriented; pleasant & cooperative... HEENT:  [place]/AT, EOM-wnl, PERRLA, Fundi-benign, EACs-clear, TMs-wnl, NOSE-clear, THROAT-clear & wnl. NECK:  Supple w/ full ROM; no JVD; normal carotid impulses w/o bruits; no thyromegaly or nodules palpated; no lymphadenopathy. CHEST:  Sl end-exp wheezing & basilar rhonchi w/o consolid... HEART:  Regular Rhythm; without murmurs/ rubs/ or gallops. ABDOMEN:  Soft & nontender; normal bowel sounds; no organomegaly or masses detected. EXT:  without deformities or arthritic changes; no varicose veins/ venous insuffic/ or edema. NEURO:  CN's intact; motor testing normal; sensory testing normal; gait normal & balance OK. DERM:  No lesions noted; no rash etc...  RADIOLOGY DATA:  Reviewed in the EPIC EMR & discussed w/ the patient...  LABORATORY DATA:  Reviewed in the EPIC EMR & discussed w/ the patient...   Assessment:      AR/ Asthma>  Asthma exac due to pollen, allergies; Rx w/ Pred taper & once again asked to stay on a controller drug like ADVAIR 250Bid regularly & use the Proair HFA as needed...  Hx Gastritis>  Aware- no prob w/ chr reflux exacerbating her asthma etc; uses OTC Pepcid/ Zantac prn...  ANEMIA>  Hg= 13.6 w/ MCV=90...     Plan:     Patient's Medications  New Prescriptions   FLUTICASONE-SALMETEROL (ADVAIR DISKUS) 250-50 MCG/DOSE AEPB    Inhale 1 puff into the lungs 2 (two) times daily.  Previous Medications   ALBUTEROL (PROAIR HFA) 108 (90 BASE) MCG/ACT INHALER    1-2 puffs by mouth every 4 hours as needed   FLUTICASONE-SALMETEROL (ADVAIR DISKUS) 250-50 MCG/DOSE AEPB    Inhale 1 puff into the lungs 2 (two) times daily.   PREDNISONE (DELTASONE) 20 MG TABLET    Take 1/2 tablet once daily x 4 days, then 1/2 tablets every other day til gone.   PROMETHAZINE-CODEINE (PHENERGAN WITH CODEINE) 6.25-10 MG/5ML SYRUP    Take 5 mLs by mouth every 6 (six) hours as needed for cough.  Modified Medications   No medications on file  Discontinued Medications   No medications on file

## 2014-05-27 NOTE — Patient Instructions (Signed)
Today we updated your med list in our EPIC system...    Continue your current medications the same...  As we discussed> looks like you'll need to stay on the ADVAIR to minimize & prevent asthma attacks...    Continue the current Prednisone taper til gone...    Use the ADVAIR 250-50 one inhalation twice daily...  Call for any questions...  Let's plan a follow up visit in 72mo, sooner if needed for problems.Marland KitchenMarland Kitchen

## 2014-07-16 ENCOUNTER — Other Ambulatory Visit: Payer: Self-pay | Admitting: Obstetrics & Gynecology

## 2014-07-17 LAB — CYTOLOGY - PAP

## 2014-12-05 ENCOUNTER — Other Ambulatory Visit (INDEPENDENT_AMBULATORY_CARE_PROVIDER_SITE_OTHER): Payer: BLUE CROSS/BLUE SHIELD

## 2014-12-05 ENCOUNTER — Ambulatory Visit (INDEPENDENT_AMBULATORY_CARE_PROVIDER_SITE_OTHER): Payer: BLUE CROSS/BLUE SHIELD | Admitting: Pulmonary Disease

## 2014-12-05 ENCOUNTER — Encounter: Payer: Self-pay | Admitting: Pulmonary Disease

## 2014-12-05 VITALS — BP 122/74 | HR 63 | Temp 98.1°F | Wt 177.6 lb

## 2014-12-05 DIAGNOSIS — Z0000 Encounter for general adult medical examination without abnormal findings: Secondary | ICD-10-CM | POA: Diagnosis not present

## 2014-12-05 DIAGNOSIS — Z23 Encounter for immunization: Secondary | ICD-10-CM

## 2014-12-05 LAB — BASIC METABOLIC PANEL
BUN: 13 mg/dL (ref 6–23)
CHLORIDE: 104 meq/L (ref 96–112)
CO2: 29 mEq/L (ref 19–32)
Calcium: 9.7 mg/dL (ref 8.4–10.5)
Creatinine, Ser: 0.93 mg/dL (ref 0.40–1.20)
GFR: 85.73 mL/min (ref >60.00)
Glucose, Bld: 85 mg/dL (ref 70–99)
POTASSIUM: 4 meq/L (ref 3.5–5.1)
Sodium: 139 mEq/L (ref 135–145)

## 2014-12-05 LAB — CBC WITH DIFFERENTIAL/PLATELET
BASOS PCT: 0.6 % (ref 0.0–3.0)
Basophils Absolute: 0 10*3/uL (ref 0.0–0.1)
EOS ABS: 0.2 10*3/uL (ref 0.0–0.7)
Eosinophils Relative: 4 % (ref 0.0–5.0)
HEMATOCRIT: 38.4 % (ref 36.0–46.0)
Hemoglobin: 12.8 g/dL (ref 12.0–15.0)
LYMPHS ABS: 2 10*3/uL (ref 0.7–4.0)
Lymphocytes Relative: 32.3 % (ref 12.0–46.0)
MCHC: 33.2 g/dL (ref 30.0–36.0)
MCV: 89 fl (ref 78.0–100.0)
MONO ABS: 0.6 10*3/uL (ref 0.1–1.0)
Monocytes Relative: 10 % (ref 3.0–12.0)
NEUTROS ABS: 3.2 10*3/uL (ref 1.4–7.7)
Neutrophils Relative %: 53.1 % (ref 43.0–77.0)
PLATELETS: 372 10*3/uL (ref 150.0–400.0)
RBC: 4.32 Mil/uL (ref 3.87–5.11)
RDW: 13.4 % (ref 11.5–15.5)
WBC: 6.1 10*3/uL (ref 4.0–10.5)

## 2014-12-05 LAB — HEPATIC FUNCTION PANEL
ALK PHOS: 46 U/L (ref 39–117)
ALT: 13 U/L (ref 0–35)
AST: 13 U/L (ref 0–37)
Albumin: 4.1 g/dL (ref 3.5–5.2)
BILIRUBIN DIRECT: 0.1 mg/dL (ref 0.0–0.3)
BILIRUBIN TOTAL: 0.5 mg/dL (ref 0.2–1.2)
Total Protein: 7.5 g/dL (ref 6.0–8.3)

## 2014-12-05 LAB — TSH: TSH: 0.64 u[IU]/mL (ref 0.35–4.50)

## 2014-12-05 NOTE — Patient Instructions (Signed)
Today we updated your med list in our EPIC system...    Continue your current medications the same...    OK to use the Advair & AlbuterolHFA rescue inhaler as needed...  Today we did your non-fasting blood work...    We will contact you w/ the results when available...   We gave you the 2016 FLU vaccine...  Call for any questions...  Let's plan a follow up visit in 52yr, sooner if needed for problems...  Regards to your family!!!

## 2014-12-06 ENCOUNTER — Telehealth: Payer: Self-pay | Admitting: Pulmonary Disease

## 2014-12-06 ENCOUNTER — Encounter: Payer: Self-pay | Admitting: Pulmonary Disease

## 2014-12-06 NOTE — Progress Notes (Signed)
Subjective:     Patient ID: Amber Stephens, female   DOB: [birth year], 39 y.o.   MRN: [id number]  HPI 29 y/o BF, daughter of Amber Stephens, & mother of 4 children- here for a follow up visit... ~  SEE PREV EPIC NOTES FOR OLDER DATA >>    CXR 7/13 showed normal heart size, clear lungs, NAD.Marland KitchenMarland Kitchen  EKG 7/13 showed NSR, rate66, WNL, NAD...  LABS 7/13:  FLP- at goals on diet;  Chems- wnl;  CBC- anemic w/ Hg=10.9 MCV=81;  TSH=0.42;  UA- +blood cells likely from menses...   ~  October 30, 2012:  72mo ROV & CPX> Amber Stephens has had a good yr- no new medical complaints or concerns, she is under incr stress w/ mother's illness and care needs...    AR, Asthma> on [phone] but only using it once daily, AlbutHFA prn (hasn't needed); no resp exac, breathing well w/o cough, sput, SOB, wheezing, CP, etc...    Hx Gastritis> uses OTC Prilosec20 or Zantac as needed; denioes abd pain, dysphagia, n/v, c/d, blood seen...    Anemia> prev Hg was 10-11 range & she took some Iron but stopped on he3r own; Labs today showed Hg=12.6 w/ Fe=107...     Stress> under stress w/ care of her mother w/ dementia; she does not require anxiolytic meds... We reviewed prob list, meds, xrays and labs> see below for updates >> OK 2014 Flu vaccine today...  LABS 9/14:  FLP- at goals on diet alone;  Chems- wnl;  CBC- ok w/ Hg=12.6;  Fe=106 (29%);  TSH=0.65...  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 We reviewed the following medical problems during today's office visit >>     AR, Asthma> prev on [phone] but was using it once daily until she ran out 50mo ago, AlbutHFA prn (hasn't needed); no resp exac, breathing well w/o  cough, sput, SOB, wheezing, CP, etc; therefore OK to stop the Advair for now...    Hx Gastritis> uses OTC Prilosec20 or Zantac as needed; denies abd pain, dysphagia, n/v, c/d, blood seen...    Anemia> prev Hg was 10-11 range & she took some Iron but stopped on her own; Labs today showed Hg=13.6 w/ MCV=90...     Stress> under stress w/ 4 kids & care of her mother w/ dementia; she does not require anxiolytic meds... We reviewed prob list, meds, xrays and labs> see below for updates >>   CXR 11/15 showed normal heart size, clear lungs, NAD...  LABS 11/15:  FLP- at goals on diet alone;  Chems- wnl;  CBC- wnl;  TSH=1.53...  ~  May 27, 2014:  36mo ROV & add-on appt for asthma exac w/ cough, SOB, wheezing;  She has hx AR/ Asthma but off her Advair250 since 10/15 as noted above;  Pt developed incr SOB, cough, wheezing in relation to the pollen this spring;  We called in Pred & refilled her Advair250 but it was too little too late- she reports sl better after using her sons NEB...  EXAM shows Afeb, VSS, O2sat=97% on RA at rest;  HEENT- sl congested nares;  Chest- mild end exp wheezing, scat rhonchi, no  consolidation;  Heart- RR w/o m/r/g;  Ext- no c/c/e...  We decided bump up the Pred dose w/ slower taper, refill the Advair250Bid, plus Mucinex, Delsym, etc...     We reviewed prob list, meds, xrays and labs> see below for updates >>   ~  December 05, 2014:  39mo ROV & yearly CPX>  Amber Stephens reports doing satis w/o breathing problems, feels well & denies cough/ sput/ SOB/ CP/ palpit/ etc... She has lost 173 down to 178# on diet & exercise... 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 we reviewed the following medical problems during today's office visit >>     AR, Asthma> prev on Advair250 prn (she had an asthma exac in the spring) & AlbutHFA prn (hasn't needed); no resp exac, breathing well w/o cough, sput, SOB, wheezing, CP, etc...    Hx Gastritis> uses OTC  Prilosec20 or Zantac as needed; denies abd pain, dysphagia, n/v, c/d, blood seen...    Anemia> prev Hg was 10-11 range & she took some Iron but stopped on her own; Labs today showed Hg=12.8 w/ MCV=89...     Stress> under stress w/ 4 kids, new home, new job (mother had dementia); she does not require anxiolytic meds... EXAM shows Afeb, VSS, O2sat=100% on RA;  HEENT- neg, mallampati1;  Chest- clear w/o w/r/r;  Heart- RR w/o m/r/g;  Abd- soft, nontender, neg;  GYN- DrNeal;  Ext- neg w/o c/c/e;  Neuro- intact...   LABS 12/05/14>  Chems- wnl;  CBC- ok w/ Hg=12.8;  TSH=0.64...  IMP/PLAN>>  Amber Stephens is stable medically, OK Flu vaccine today, same meds, call if she needs anything for the stress, ROV recheck 55yr...            Problem List:    ALLERGIC RHINITIS (ICD-477.9) - hx allergy testing yrs ago by DrTBratton w/ +reactions to pollen, trees, grasses... she uses OTC antihist Prn...  ASTHMA (ICD-493.90) - she is a never smoker, hx asthma and AB x yrs... on PROAIR inhaler Prn... triggers include upper resp infections & springtime allergen exposures... occas requires Depo/ Pred (none for yrs until 4/13)- she was on Vanceril inhaler in the 90's & stopped on her own... PFT 6/11 w/ mild persist asthma w/ obstruction on PFT >> we discussed starting & staying on ADVAIR 250- Bid regularly... ~  baseline CXR showed clear, NAD... ~  PFT 6/06 showed FVC=2.84 (100%), FEV1=2.00 (82%), %1sec=71, mid-flows=45%pred. ~  PFT 6/11 showed FVC=2.28 (78%), FEV1=1.64 (66%), %1sec=72, mid-flows=38%pred. ~  4/13:  Asthma exac treated by TP w/ Pred & restart [id number]- asked to stay on this regularly... ~  CXR 7/13 showed normal heart size, clear lungs, NAD... ~  9/14: stable on Advair250 once daily & hasn't needed the rescue inhaler... ~  11/15: stable off the Advair now for >30mo & we decided to leave it off for now & use Proair HFA rescue inhaler as needed... ~  CXR 11/15 showed normal heart size, clear lungs, NAD... ~  4/16:  called w/ asthma exac due to the pollen etc; she has been off her Advair x85mo; Rx w/ Pred taper & restart the Advair250Bid...  Hx of PNEUMONIA (ICD-486) - hx LLL CAP in 1997 resolved w/ Cephalosporin Rx + ZPak...  Hx of GASTRITIS (ICD-535.50) - remote hx gastritis treated w/ H2Blockers at the time... denies dysphagia, heartburn, indigestion, n/v, abd pain, etc...  ANEMIA >> rec to take FeSO4 daily... ~  Labs 7/13 showed Hg= 10.9, MCV= 81... Needs  daily Fe supplement...  ~  Labs 9/14 showed Hg= 12.6;  Fe=106 (29%) ~  Labs 11/15 showed Hg= 13.6  Health Maintenance:  GYN= DrNeal et al... GI= DrStark yrs ago (she works at [employer])...  ~  Immuniz:  She gets the yearly Flu shot at work... Given TDAP 11/15 here...    Past Surgical History  Procedure Laterality Date  . Cesarean section    . Btl  10/10    Dr Corinna Capra  . Dilation and curettage of uterus  07/02/2012    Outpatient Encounter Prescriptions as of 12/05/2014  Medication Sig  . albuterol (PROAIR HFA) 108 (90 BASE) MCG/ACT inhaler 1-2 puffs by mouth every 4 hours as needed  . Fluticasone-Salmeterol (ADVAIR DISKUS) 250-50 MCG/DOSE AEPB Inhale 1 puff into the lungs 2 (two) times daily. (Patient not taking: Reported on 12/05/2014)  . predniSONE (DELTASONE) 20 MG tablet Take 1/2 tablet once daily x 4 days, then 1/2 tablets every other day til gone. (Patient not taking: Reported on 12/05/2014)  . promethazine-codeine (PHENERGAN WITH CODEINE) 6.25-10 MG/5ML syrup Take 5 mLs by mouth every 6 (six) hours as needed for cough. (Patient not taking: Reported on 05/27/2014)  . [DISCONTINUED] Fluticasone-Salmeterol (ADVAIR DISKUS) 250-50 MCG/DOSE AEPB Inhale 1 puff into the lungs 2 (two) times daily.   No facility-administered encounter medications on file as of 12/05/2014.    No Known Allergies   Current Medications, Allergies, Past Medical History, Past Surgical History, Family History, and Social History were reviewed in [facility] record.   Review of Systems        See HPI - all other systems neg except as noted... The patient denies anorexia, fever, weight loss, weight gain, vision loss, decreased hearing, hoarseness, chest pain, syncope, dyspnea on exertion, peripheral edema, prolonged cough, headaches, hemoptysis, abdominal pain, melena, hematochezia, severe indigestion/heartburn, hematuria, incontinence, muscle weakness, suspicious skin lesions, transient blindness, difficulty walking, depression, unusual weight change, abnormal bleeding, enlarged lymph nodes, and angioedema.     Objective:   Physical Exam     WD, WN, 39 y/o BF in NAD... GENERAL:  Alert & oriented; pleasant & cooperative... HEENT:  [place]/AT, EOM-wnl, PERRLA, Fundi-benign, EACs-clear, TMs-wnl, NOSE-clear, THROAT-clear & wnl. NECK:  Supple w/ full ROM; no JVD; normal carotid impulses w/o bruits; no thyromegaly or nodules palpated; no lymphadenopathy. CHEST:  Sl end-exp wheezing & basilar rhonchi w/o consolid... HEART:  Regular Rhythm; without murmurs/ rubs/ or gallops. ABDOMEN:  Soft & nontender; normal bowel sounds; no organomegaly or masses detected. EXT: without deformities or arthritic changes; no varicose veins/ venous insuffic/ or edema. NEURO:  CN's intact; motor testing normal; sensory testing normal; gait normal & balance OK. DERM:  No lesions noted; no rash etc...  RADIOLOGY DATA:  Reviewed in the EPIC EMR & discussed w/ the patient...  LABORATORY DATA:  Reviewed in the EPIC EMR & discussed w/ the patient...   Assessment:      AR/ Asthma>  Asthma exac due to pollen, allergies; Rx w/ Pred taper & once again asked to stay on a controller drug like ADVAIR 250Bid regularly & use the Proair HFA as needed...  Hx Gastritis>  Aware- no prob w/ chr reflux exacerbating her asthma etc; uses OTC Pepcid/ Zantac prn...  ANEMIA>  Hg= 12.8 w/ MCV=89...     Plan:     Patient's Medications  New Prescriptions   No medications  on file  Previous Medications   ALBUTEROL (PROAIR HFA) 108 (90 BASE) MCG/ACT INHALER  1-2 puffs by mouth every 4 hours as needed   FLUTICASONE-SALMETEROL (ADVAIR DISKUS) 250-50 MCG/DOSE AEPB    Inhale 1 puff into the lungs 2 (two) times daily.   PREDNISONE (DELTASONE) 20 MG TABLET    Take 1/2 tablet once daily x 4 days, then 1/2 tablets every other day til gone.   PROMETHAZINE-CODEINE (PHENERGAN WITH CODEINE) 6.25-10 MG/5ML SYRUP    Take 5 mLs by mouth every 6 (six) hours as needed for cough.  Modified Medications   No medications on file  Discontinued Medications   FLUTICASONE-SALMETEROL (ADVAIR DISKUS) 250-50 MCG/DOSE AEPB    Inhale 1 puff into the lungs 2 (two) times daily.

## 2014-12-06 NOTE — Telephone Encounter (Signed)
Per SN>> Notify pt that labwork came back WNL and tell her to keep up the good work!!  Musician and informed pt of lab results Pt voiced understanding of results Pt requested a copy of results be mailed to her Address verified with pt Results mailed to pt  Nothing further is needed

## 2015-12-08 ENCOUNTER — Ambulatory Visit: Payer: BLUE CROSS/BLUE SHIELD | Admitting: Pulmonary Disease

## 2016-01-08 ENCOUNTER — Encounter: Payer: Self-pay | Admitting: Pulmonary Disease

## 2016-01-08 ENCOUNTER — Ambulatory Visit (INDEPENDENT_AMBULATORY_CARE_PROVIDER_SITE_OTHER)
Admission: RE | Admit: 2016-01-08 | Discharge: 2016-01-08 | Disposition: A | Discharge status: Home / Self Care | Payer: BLUE CROSS/BLUE SHIELD | Source: Ambulatory Visit | Attending: Pulmonary Disease | Admitting: Pulmonary Disease

## 2016-01-08 ENCOUNTER — Ambulatory Visit (INDEPENDENT_AMBULATORY_CARE_PROVIDER_SITE_OTHER): Payer: BLUE CROSS/BLUE SHIELD | Admitting: Pulmonary Disease

## 2016-01-08 ENCOUNTER — Other Ambulatory Visit (INDEPENDENT_AMBULATORY_CARE_PROVIDER_SITE_OTHER): Payer: BLUE CROSS/BLUE SHIELD

## 2016-01-08 VITALS — BP 110/70 | HR 56 | Temp 98.8°F | Ht 62.0 in | Wt 184.0 lb

## 2016-01-08 DIAGNOSIS — J4520 Mild intermittent asthma, uncomplicated: Secondary | ICD-10-CM | POA: Diagnosis not present

## 2016-01-08 DIAGNOSIS — Z0000 Encounter for general adult medical examination without abnormal findings: Secondary | ICD-10-CM

## 2016-01-08 DIAGNOSIS — J301 Allergic rhinitis due to pollen: Secondary | ICD-10-CM

## 2016-01-08 LAB — CBC WITH DIFFERENTIAL/PLATELET
BASOS ABS: 0 10*3/uL (ref 0.0–0.1)
Basophils Relative: 0.6 % (ref 0.0–3.0)
EOS ABS: 0.2 10*3/uL (ref 0.0–0.7)
Eosinophils Relative: 2.5 % (ref 0.0–5.0)
HCT: 38.2 % (ref 36.0–46.0)
Hemoglobin: 13.1 g/dL (ref 12.0–15.0)
LYMPHS ABS: 2.4 10*3/uL (ref 0.7–4.0)
LYMPHS PCT: 35 % (ref 12.0–46.0)
MCHC: 34.2 g/dL (ref 30.0–36.0)
MCV: 87.1 fl (ref 78.0–100.0)
MONOS PCT: 9.7 % (ref 3.0–12.0)
Monocytes Absolute: 0.7 10*3/uL (ref 0.1–1.0)
NEUTROS ABS: 3.6 10*3/uL (ref 1.4–7.7)
NEUTROS PCT: 52.2 % (ref 43.0–77.0)
PLATELETS: 372 10*3/uL (ref 150.0–400.0)
RBC: 4.39 Mil/uL (ref 3.87–5.11)
RDW: 13.6 % (ref 11.5–15.5)
WBC: 6.9 10*3/uL (ref 4.0–10.5)

## 2016-01-08 LAB — LIPID PANEL
CHOLESTEROL: 140 mg/dL (ref 0–200)
HDL: 51.5 mg/dL (ref >39.00)
LDL CALC: 78 mg/dL (ref 0–99)
NonHDL: 88.76
TRIGLYCERIDES: 55 mg/dL (ref 0.0–149.0)
Total CHOL/HDL Ratio: 3
VLDL: 11 mg/dL (ref 0.0–40.0)

## 2016-01-08 LAB — COMPREHENSIVE METABOLIC PANEL
ALBUMIN: 4.4 g/dL (ref 3.5–5.2)
ALK PHOS: 41 U/L (ref 39–117)
ALT: 14 U/L (ref 0–35)
AST: 16 U/L (ref 0–37)
BUN: 11 mg/dL (ref 6–23)
CALCIUM: 9.6 mg/dL (ref 8.4–10.5)
CHLORIDE: 104 meq/L (ref 96–112)
CO2: 27 mEq/L (ref 19–32)
CREATININE: 1.01 mg/dL (ref 0.40–1.20)
GFR: 77.52 mL/min (ref >60.00)
Glucose, Bld: 89 mg/dL (ref 70–99)
POTASSIUM: 3.7 meq/L (ref 3.5–5.1)
Sodium: 138 mEq/L (ref 135–145)
TOTAL PROTEIN: 7.9 g/dL (ref 6.0–8.3)
Total Bilirubin: 0.6 mg/dL (ref 0.2–1.2)

## 2016-01-08 LAB — TSH: TSH: 0.53 u[IU]/mL (ref 0.35–4.50)

## 2016-01-08 MED ORDER — ALBUTEROL SULFATE HFA 108 (90 BASE) MCG/ACT IN AERS: INHALATION_SPRAY | RESPIRATORY_TRACT | 6 refills | Status: DC

## 2016-01-08 NOTE — Patient Instructions (Signed)
Amber Stephens, it was a pleasure seeing you again...  We refilled your PROAIR-HFA rescue inhaler to use as needed...    Call for any trouble w/ your breathing...  Today we checked your follow up CXR, EKG, & FASTING blood work...    We will contact you w/ the results when available...   Keep up the good work w/ DIET & EXERCISE...  Call for any questions or if we can be of service in any way...  Let's plan a follow up visit in 72yr, sooner if needed for problems.Marland KitchenMarland Kitchen

## 2016-01-08 NOTE — Progress Notes (Signed)
Subjective:     Patient ID: Amber Stephens, female   DOB: 09-05-1975, 40 y.o.   MRN: [id number]  HPI 64 y/o BF, daughter of Amber Stephens, & mother of 4 children- here for a follow up visit... ~  SEE PREV EPIC NOTES FOR OLDER DATA >>    CXR 7/13 showed normal heart size, clear lungs, NAD.Marland KitchenMarland Kitchen  EKG 7/13 showed NSR, rate66, WNL, NAD...  LABS 7/13:  FLP- at goals on diet;  Chems- wnl;  CBC- anemic w/ Hg=10.9 MCV=81;  TSH=0.42;  UA- +blood cells likely from menses...   ~  October 30, 2012:  57mo ROV & CPX> [name] has had a good yr- no new medical complaints or concerns, she is under incr stress w/ mother's illness and care needs...    AR, Asthma> on [phone] but only using it once daily, AlbutHFA prn (hasn't needed); no resp exac, breathing well w/o cough, sput, SOB, wheezing, CP, etc...    Hx Gastritis> uses OTC Prilosec20 or Zantac as needed; denioes abd pain, dysphagia, n/v, c/d, blood seen...    Anemia> prev Hg was 10-11 range & she took some Iron but stopped on he3r own; Labs today showed Hg=12.6 w/ Fe=107...     Stress> under stress w/ care of her mother w/ dementia; she does not require anxiolytic meds... We reviewed prob list, meds, xrays and labs> see below for updates >> OK 2014 Flu vaccine today...  LABS 9/14:  FLP- at goals on diet alone;  Chems- wnl;  CBC- ok w/ Hg=12.6;  Fe=106 (29%);  TSH=0.65...  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 We reviewed the following medical problems during today's office visit >>     AR, Asthma> prev on [phone] but was using it once daily until she ran out 57mo ago, AlbutHFA prn (hasn't needed); no resp exac, breathing well w/o  cough, sput, SOB, wheezing, CP, etc; therefore OK to stop the Advair for now...    Hx Gastritis> uses OTC Prilosec20 or Zantac as needed; denies abd pain, dysphagia, n/v, c/d, blood seen...    Anemia> prev Hg was 10-11 range & she took some Iron but stopped on her own; Labs today showed Hg=13.6 w/ MCV=90...     Stress> under stress w/ 4 kids & care of her mother w/ dementia; she does not require anxiolytic meds... We reviewed prob list, meds, xrays and labs> see below for updates >>   CXR 11/15 showed normal heart size, clear lungs, NAD...  LABS 11/15:  FLP- at goals on diet alone;  Chems- wnl;  CBC- wnl;  TSH=1.53...  ~  May 27, 2014:  55mo ROV & add-on appt for asthma exac w/ cough, SOB, wheezing;  She has hx AR/ Asthma but off her Advair250 since 10/15 as noted above;  Pt developed incr SOB, cough, wheezing in relation to the pollen this spring;  We called in Pred & refilled her Advair250 but it was too little too late- she reports sl better after using her sons NEB...  EXAM shows Afeb, VSS, O2sat=97% on RA at rest;  HEENT- sl congested nares;  Chest- mild end exp wheezing, scat rhonchi, no  consolidation;  Heart- RR w/o m/r/g;  Ext- no c/c/e...  We decided bump up the Pred dose w/ slower taper, refill the Advair250Bid, plus Mucinex, Delsym, etc...     We reviewed prob list, meds, xrays and labs> see below for updates >>   ~  December 05, 2014:  5mo ROV & yearly CPX>  Amber Stephens reports doing satis w/o breathing problems, feels well & denies cough/ sput/ SOB/ CP/ palpit/ etc... She has lost 17# down to 178# on diet & exercise... 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 we reviewed the following medical problems during today's office visit >>     AR, Asthma> prev on Advair250 prn (she had an asthma exac in the spring) & AlbutHFA prn (hasn't needed); no resp exac, breathing well w/o cough, sput, SOB, wheezing, CP, etc...    Hx Gastritis> uses OTC  Prilosec20 or Zantac as needed; denies abd pain, dysphagia, n/v, c/d, blood seen...    Anemia> prev Hg was 10-11 range & she took some Iron but stopped on her own; Labs today showed Hg=12.8 w/ MCV=89...     Stress> under stress w/ 4 kids, new home, new job (mother had dementia); she does not require anxiolytic meds... EXAM shows Afeb, VSS, O2sat=100% on RA;  HEENT- neg, mallampati1;  Chest- clear w/o w/r/r;  Heart- RR w/o m/r/g;  Abd- soft, nontender, neg;  GYN- DrNeal;  Ext- neg w/o c/c/e;  Neuro- intact...   LABS 12/05/14>  Chems- wnl;  CBC- ok w/ Hg=12.8;  TSH=0.64...  IMP/PLAN>>  Amber Stephens is stable medically, OK Flu vaccine today, same meds, call if she needs anything for the stress, ROV recheck 42yr...   ~  January 08, 2016:  52mo ROV & CPX>  Amber Stephens reports that her asthma is much improved- she stopped the Advair on her own earlier this yr & has not required any rescue inhaler doses, no attacks, says she stays indoors in the spring & uses Benedryl + other OTCs as needed... 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 We reviewed the following medical problems during today's office visit >>     AR, Asthma> prev on Advair250- she stopped on her own in 2017, & AlbutHFA prn (hasn't needed); no resp exac, breathing well w/o cough, sput, SOB, wheezing, CP, etc...    Hx Gastritis> uses OTC Prilosec20 or Zantac as needed; denies abd pain, dysphagia, n/v, c/d, blood seen...    Anemia> prev Hg was 10-11 range & she took some Iron but stopped on her own; Labs today showed Hg=13.1 w/ MCV=87...     Stress> under stress w/ 4 kids, new home, new job, mother w/ dementia; she does not require anxiolytic meds... EXAM shows Afeb, VSS, O2sat=100% on RA;  HEENT- neg, mallampati1;  Chest- clear w/o w/r/r;  Heart- RR w/o m/r/g;  Abd- soft, nontender, neg;  GYN- DrNeal;  Ext- neg w/o c/c/e;  Neuro-  intact...   CXR 01/08/16 (independently reviewed by me in the PACS system) showed norm heart size, clear lungs- wnl...  EKG 01/08/16 showed SBrady, rate51, wnl- NAD...  LABS 01/08/16>  FLP- all parameters at goals on diet alone;  Chems- wnl;  CBC- wnl w/ Hg=13.1;  TSH=0.53 IMP/PLAN>>  Samon has stopped her Advair on her own & doing well w/ prn rescue Albuterol (  hasn't needed)- we will continue to monitor;  Stable medically & we reviewed diet/ exercise recommendations; she already had the 2017 FLU vaccine; call for any problems & we plan f/u 79yr...           Problem List:    ALLERGIC RHINITIS (ICD-477.9) - hx allergy testing yrs ago by DrTBratton w/ +reactions to pollen, trees, grasses... she uses OTC antihist Prn...  ASTHMA (ICD-493.90) - she is a never smoker, hx asthma and AB x yrs... on PROAIR inhaler Prn... triggers include upper resp infections & springtime allergen exposures... occas requires Depo/ Pred (none for yrs until 4/13)- she was on Vanceril inhaler in the 90's & stopped on her own... PFT 6/11 w/ mild persist asthma w/ obstruction on PFT >> we discussed starting & staying on ADVAIR 250- Bid regularly... ~  baseline CXR showed clear, NAD... ~  PFT 6/06 showed FVC=2.84 (100%), FEV1=2.00 (82%), %1sec=71, mid-flows=45%pred. ~  PFT 6/11 showed FVC=2.28 (78%), FEV1=1.64 (66%), %1sec=72, mid-flows=38%pred. ~  4/13:  Asthma exac treated by TP w/ Pred & restart [id number]- asked to stay on this regularly... ~  CXR 7/13 showed normal heart size, clear lungs, NAD... ~  9/14: stable on Advair250 once daily & hasn't needed the rescue inhaler... ~  11/15: stable off the Advair now for >86mo & we decided to leave it off for now & use Proair HFA rescue inhaler as needed... ~  CXR 11/15 showed normal heart size, clear lungs, NAD... ~  4/16: called w/ asthma exac due to the pollen etc; she has been off her Advair x66mo; Rx w/ Pred taper & restart the Advair250Bid...  Hx of PNEUMONIA (ICD-486) - hx  LLL CAP in 1997 resolved w/ Cephalosporin Rx + ZPak...  Hx of GASTRITIS (ICD-535.50) - remote hx gastritis treated w/ H2Blockers at the time... denies dysphagia, heartburn, indigestion, n/v, abd pain, etc...  ANEMIA >> rec to take FeSO4 daily... ~  Labs 7/13 showed Hg= 10.9, MCV= 81... Needs daily Fe supplement...  ~  Labs 9/14 showed Hg= 12.6;  Fe=106 (29%) ~  Labs 11/15 showed Hg= 13.6  Health Maintenance:  GYN= DrNeal et al... GI= DrStark yrs ago (she works at [employer])...  ~  Immuniz:  She gets the yearly Flu shot at work... Given TDAP 11/15 here...    Past Surgical History:  Procedure Laterality Date  . BTL  10/10   Dr Corinna Capra  . CESAREAN SECTION    . DILATION AND CURETTAGE OF UTERUS  07/02/2012    Outpatient Encounter Prescriptions as of 01/08/2016  Medication Sig Dispense Refill  . albuterol (PROAIR HFA) 108 (90 Base) MCG/ACT inhaler 1-2 puffs by mouth every 4 hours as needed 1 Inhaler 6  . [DISCONTINUED] albuterol (PROAIR HFA) 108 (90 BASE) MCG/ACT inhaler 1-2 puffs by mouth every 4 hours as needed 1 Inhaler 6  . [DISCONTINUED] Fluticasone-Salmeterol (ADVAIR DISKUS) 250-50 MCG/DOSE AEPB Inhale 1 puff into the lungs 2 (two) times daily. (Patient not taking: Reported on 01/08/2016) 60 each 5  . [DISCONTINUED] predniSONE (DELTASONE) 20 MG tablet Take 1/2 tablet once daily x 4 days, then 1/2 tablets every other day til gone. (Patient not taking: Reported on 01/08/2016) 16 tablet 0  . [DISCONTINUED] promethazine-codeine (PHENERGAN WITH CODEINE) 6.25-10 MG/5ML syrup Take 5 mLs by mouth every 6 (six) hours as needed for cough. (Patient not taking: Reported on 01/08/2016) 200 mL 0   No facility-administered encounter medications on file as of 01/08/2016.     No Known Allergies  Current Medications, Allergies, Past Medical History, Past Surgical History, Family History, and Social History were reviewed in [facility] record.   Review of Systems        See  HPI - all other systems neg except as noted... The patient denies anorexia, fever, weight loss, weight gain, vision loss, decreased hearing, hoarseness, chest pain, syncope, dyspnea on exertion, peripheral edema, prolonged cough, headaches, hemoptysis, abdominal pain, melena, hematochezia, severe indigestion/heartburn, hematuria, incontinence, muscle weakness, suspicious skin lesions, transient blindness, difficulty walking, depression, unusual weight change, abnormal bleeding, enlarged lymph nodes, and angioedema.     Objective:   Physical Exam     WD, WN, 40 y/o BF in NAD... GENERAL:  Alert & oriented; pleasant & cooperative... HEENT:  [place]/AT, EOM-wnl, PERRLA, Fundi-benign, EACs-clear, TMs-wnl, NOSE-clear, THROAT-clear & wnl. NECK:  Supple w/ full ROM; no JVD; normal carotid impulses w/o bruits; no thyromegaly or nodules palpated; no lymphadenopathy. CHEST:  Sl end-exp wheezing & basilar rhonchi w/o consolid... HEART:  Regular Rhythm; without murmurs/ rubs/ or gallops. ABDOMEN:  Soft & nontender; normal bowel sounds; no organomegaly or masses detected. EXT: without deformities or arthritic changes; no varicose veins/ venous insuffic/ or edema. NEURO:  CN's intact; motor testing normal; sensory testing normal; gait normal & balance OK. DERM:  No lesions noted; no rash etc...  RADIOLOGY DATA:  Reviewed in the EPIC EMR & discussed w/ the patient...  LABORATORY DATA:  Reviewed in the EPIC EMR & discussed w/ the patient...   Assessment:      CPX 01/08/16 >> Torianna has stopped her Advair on her own & doing well w/ prn rescue Albuterol (hasn't needed)- we will continue to monitor;  Stable medically & we reviewed diet/ exercise recommendations; she already had the 2017 FLU vaccine; call for any problems & we plan f/u 83yr.   AR/ Asthma>  Asthma exac due to pollen, allergies; Rx w/ Pred taper & once again asked to stay on a controller drug like ADVAIR 250Bid regularly & use the Proair HFA as  needed...  Hx Gastritis>  Aware- no prob w/ chr reflux exacerbating her asthma etc; uses OTC Pepcid/ Zantac prn...  ANEMIA>  Hg= 12.8 w/ MCV=89...     Plan:     Patient's Medications  New Prescriptions   No medications on file  Previous Medications   No medications on file  Modified Medications   Modified Medication Previous Medication   ALBUTEROL (PROAIR HFA) 108 (90 BASE) MCG/ACT INHALER albuterol (PROAIR HFA) 108 (90 BASE) MCG/ACT inhaler      1-2 puffs by mouth every 4 hours as needed    1-2 puffs by mouth every 4 hours as needed  Discontinued Medications   FLUTICASONE-SALMETEROL (ADVAIR DISKUS) 250-50 MCG/DOSE AEPB    Inhale 1 puff into the lungs 2 (two) times daily.   PREDNISONE (DELTASONE) 20 MG TABLET    Take 1/2 tablet once daily x 4 days, then 1/2 tablets every other day til gone.   PROMETHAZINE-CODEINE (PHENERGAN WITH CODEINE) 6.25-10 MG/5ML SYRUP    Take 5 mLs by mouth every 6 (six) hours as needed for cough.

## 2017-01-10 ENCOUNTER — Ambulatory Visit: Payer: BLUE CROSS/BLUE SHIELD | Admitting: Pulmonary Disease

## 2017-01-18 ENCOUNTER — Encounter: Payer: Self-pay | Admitting: Pulmonary Disease

## 2017-01-18 ENCOUNTER — Ambulatory Visit (INDEPENDENT_AMBULATORY_CARE_PROVIDER_SITE_OTHER): Payer: BLUE CROSS/BLUE SHIELD | Admitting: Pulmonary Disease

## 2017-01-18 ENCOUNTER — Other Ambulatory Visit (INDEPENDENT_AMBULATORY_CARE_PROVIDER_SITE_OTHER): Payer: BLUE CROSS/BLUE SHIELD

## 2017-01-18 VITALS — BP 128/88 | HR 72 | Temp 98.3°F | Ht 62.0 in | Wt 186.0 lb

## 2017-01-18 DIAGNOSIS — Z0000 Encounter for general adult medical examination without abnormal findings: Secondary | ICD-10-CM | POA: Diagnosis not present

## 2017-01-18 DIAGNOSIS — J301 Allergic rhinitis due to pollen: Secondary | ICD-10-CM

## 2017-01-18 DIAGNOSIS — J4520 Mild intermittent asthma, uncomplicated: Secondary | ICD-10-CM

## 2017-01-18 LAB — CBC WITH DIFFERENTIAL/PLATELET
BASOS PCT: 0.9 % (ref 0.0–3.0)
Basophils Absolute: 0.1 10*3/uL (ref 0.0–0.1)
EOS ABS: 0.2 10*3/uL (ref 0.0–0.7)
Eosinophils Relative: 2.8 % (ref 0.0–5.0)
HCT: 40.9 % (ref 36.0–46.0)
HEMOGLOBIN: 13.6 g/dL (ref 12.0–15.0)
LYMPHS ABS: 2.6 10*3/uL (ref 0.7–4.0)
Lymphocytes Relative: 35 % (ref 12.0–46.0)
MCHC: 33.3 g/dL (ref 30.0–36.0)
MCV: 89.3 fl (ref 78.0–100.0)
MONO ABS: 0.6 10*3/uL (ref 0.1–1.0)
Monocytes Relative: 7.9 % (ref 3.0–12.0)
NEUTROS PCT: 53.4 % (ref 43.0–77.0)
Neutro Abs: 4 10*3/uL (ref 1.4–7.7)
Platelets: 353 10*3/uL (ref 150.0–400.0)
RBC: 4.58 Mil/uL (ref 3.87–5.11)
RDW: 13.5 % (ref 11.5–15.5)
WBC: 7.5 10*3/uL (ref 4.0–10.5)

## 2017-01-18 LAB — LIPID PANEL
CHOLESTEROL: 146 mg/dL (ref 0–200)
HDL: 54.1 mg/dL (ref >39.00)
LDL Cholesterol: 71 mg/dL (ref 0–99)
NonHDL: 92.03
Total CHOL/HDL Ratio: 3
Triglycerides: 106 mg/dL (ref 0.0–149.0)
VLDL: 21.2 mg/dL (ref 0.0–40.0)

## 2017-01-18 LAB — COMPREHENSIVE METABOLIC PANEL
ALBUMIN: 4.2 g/dL (ref 3.5–5.2)
ALK PHOS: 45 U/L (ref 39–117)
ALT: 12 U/L (ref 0–35)
AST: 14 U/L (ref 0–37)
BILIRUBIN TOTAL: 0.6 mg/dL (ref 0.2–1.2)
BUN: 9 mg/dL (ref 6–23)
CO2: 29 mEq/L (ref 19–32)
CREATININE: 0.89 mg/dL (ref 0.40–1.20)
Calcium: 9.4 mg/dL (ref 8.4–10.5)
Chloride: 102 mEq/L (ref 96–112)
GFR: 89.26 mL/min (ref >60.00)
GLUCOSE: 84 mg/dL (ref 70–99)
POTASSIUM: 3.9 meq/L (ref 3.5–5.1)
SODIUM: 137 meq/L (ref 135–145)
TOTAL PROTEIN: 7.7 g/dL (ref 6.0–8.3)

## 2017-01-18 LAB — TSH: TSH: 0.85 u[IU]/mL (ref 0.35–4.50)

## 2017-01-18 NOTE — Progress Notes (Signed)
Subjective:     Patient ID: Amber Stephens, female   DOB: Jun 17, 1975, 41 y.o.   MRN: [id number]  HPI 21 y/o BF, daughter of [name], & mother of 4 children- here for a follow up visit... ~  SEE PREV EPIC NOTES FOR OLDER DATA >>    CXR 7/13 showed normal heart size, clear lungs, NAD.Marland KitchenMarland Kitchen  EKG 7/13 showed NSR, rate66, WNL, NAD...  LABS 7/13:  FLP- at goals on diet;  Chems- wnl;  CBC- anemic w/ Hg=10.9 MCV=81;  TSH=0.42;  UA- +blood cells likely from menses...   ~  October 30, 2012:  89mo ROV & CPX> Amber Stephens has had a good yr- no new medical complaints or concerns, she is under incr stress w/ mother's illness and care needs...    AR, Asthma> on [phone] but only using it once daily, AlbutHFA prn (hasn't needed); no resp exac, breathing well w/o cough, sput, SOB, wheezing, CP, etc...    Hx Gastritis> uses OTC Prilosec20 or Zantac as needed; denioes abd pain, dysphagia, n/v, c/d, blood seen...    Anemia> prev Hg was 10-11 range & she took some Iron but stopped on he3r own; Labs today showed Hg=12.6 w/ Fe=107...     Stress> under stress w/ care of her mother w/ dementia; she does not require anxiolytic meds... We reviewed prob list, meds, xrays and labs> see below for updates >> OK 2014 Flu vaccine today...  LABS 9/14:  FLP- at goals on diet alone;  Chems- wnl;  CBC- ok w/ Hg=12.6;  Fe=106 (29%);  TSH=0.65...  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 We reviewed the following medical problems during today's office visit >>     AR, Asthma> prev on [phone] but was using it once daily until she ran out 73mo ago, AlbutHFA prn (hasn't needed); no resp exac, breathing well w/o  cough, sput, SOB, wheezing, CP, etc; therefore OK to stop the Advair for now...    Hx Gastritis> uses OTC Prilosec20 or Zantac as needed; denies abd pain, dysphagia, n/v, c/d, blood seen...    Anemia> prev Hg was 10-11 range & she took some Iron but stopped on her own; Labs today showed Hg=13.6 w/ MCV=90...     Stress> under stress w/ 4 kids & care of her mother w/ dementia; she does not require anxiolytic meds... We reviewed prob list, meds, xrays and labs> see below for updates >>   CXR 11/15 showed normal heart size, clear lungs, NAD...  LABS 11/15:  FLP- at goals on diet alone;  Chems- wnl;  CBC- wnl;  TSH=1.53...  ~  May 27, 2014:  28mo ROV & add-on appt for asthma exac w/ cough, SOB, wheezing;  She has hx AR/ Asthma but off her Advair250 since 10/15 as noted above;  Pt developed incr SOB, cough, wheezing in relation to the pollen this spring;  We called in Pred & refilled her Advair250 but it was too little too late- she reports sl better after using her sons NEB...  EXAM shows Afeb, VSS, O2sat=97% on RA at rest;  HEENT- sl congested nares;  Chest- mild end exp wheezing, scat rhonchi, no  consolidation;  Heart- RR w/o m/r/g;  Ext- no c/c/e...  We decided bump up the Pred dose w/ slower taper, refill the Advair250Bid, plus Mucinex, Delsym, etc...     We reviewed prob list, meds, xrays and labs> see below for updates >>   ~  December 05, 2014:  37mo ROV & yearly CPX>  Amber Stephens reports doing satis w/o breathing problems, feels well & denies cough/ sput/ SOB/ CP/ palpit/ etc... She has lost 17# down to 178# on diet & exercise... 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 we reviewed the following medical problems during today's office visit >>     AR, Asthma> prev on Advair250 prn (she had an asthma exac in the spring) & AlbutHFA prn (hasn't needed); no resp exac, breathing well w/o cough, sput, SOB, wheezing, CP, etc...    Hx Gastritis> uses OTC  Prilosec20 or Zantac as needed; denies abd pain, dysphagia, n/v, c/d, blood seen...    Anemia> prev Hg was 10-11 range & she took some Iron but stopped on her own; Labs today showed Hg=12.8 w/ MCV=89...     Stress> under stress w/ 4 kids, new home, new job (mother has dementia); she does not require anxiolytic meds... EXAM shows Afeb, VSS, O2sat=100% on RA;  HEENT- neg, mallampati1;  Chest- clear w/o w/r/r;  Heart- RR w/o m/r/g;  Abd- soft, nontender, neg;  GYN- DrNeal;  Ext- neg w/o c/c/e;  Neuro- intact...   LABS 12/05/14>  Chems- wnl;  CBC- ok w/ Hg=12.8;  TSH=0.64...  IMP/PLAN>>  Amber Stephens is stable medically, OK Flu vaccine today, same meds, call if she needs anything for the stress, ROV recheck 29yr...   ~  January 08, 2016:  8mo ROV & CPX>  Amber Stephens reports that her asthma is much improved- she stopped the Advair on her own earlier this yr & has not required any rescue inhaler doses, no attacks, says she stays indoors in the spring & uses Benedryl + other OTCs as needed... 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  We reviewed the following medical problems during today's office visit >>     AR, Asthma> prev on Advair250- she stopped on her own in 2017, & AlbutHFA prn (hasn't needed); no resp exac, breathing well w/o cough, sput, SOB, wheezing, CP, etc...    Hx Gastritis> uses OTC Prilosec20 or Zantac as needed; denies abd pain, dysphagia, n/v, c/d, blood seen...    Anemia> prev Hg was 10-11 range & she took some Iron but stopped on her own; Labs today showed Hg=13.1 w/ MCV=87...     Stress> under stress w/ 4 kids, new home, new job, mother w/ dementia; she does not require anxiolytic meds... EXAM shows Afeb, VSS, O2sat=100% on RA;  HEENT- neg, mallampati1;  Chest- clear w/o w/r/r;  Heart- RR w/o m/r/g;  Abd- soft, nontender, neg;  GYN- DrNeal;  Ext- neg w/o c/c/e;  Neuro-  intact...   CXR 01/08/16 (independently reviewed by me in the PACS system) showed norm heart size, clear lungs- wnl...  EKG 01/08/16 showed SBrady, rate51, wnl- NAD...  LABS 01/08/16>  FLP- all parameters at goals on diet alone;  Chems- wnl;  CBC- wnl w/ Hg=13.1;  TSH=0.53 IMP/PLAN>>  Amber Stephens has stopped her Advair on her own & doing well w/ prn rescue  Albuterol (hasn't needed)- we will continue to monitor;  Stable medically & we reviewed diet/ exercise recommendations; she already had the 2017 FLU vaccine; call for any problems & we plan f/u 52yr...   ~  January 18, 2017:  89yr [place] reports feeling great, no new complaints or concerns; notes occas HA, some stress, and GYN- DrNeal is planning a hysterectomy;  She has not needed the Advair in >37yr and has rescue inhaler for prn use but hasn't needed that either! We reviewed the following medical problems during today's office visit>      AR, Asthma> prev on [id number]- she stopped on her own in 2017, & AlbutHFA prn (hasn't needed); no resp exac, breathing well w/o cough, sput, SOB, wheezing, CP, etc...    Hx Gastritis> uses OTC Prilosec20 or Zantac as needed; denies abd pain, dysphagia, n/v, c/d, blood seen...    Anemia> prev Hg was 10-11 range & she took some Iron but stopped on her own; Labs today showed Hg=13.6 w/ MCV=89...     Stress> under stress w/ 4 kids, new home, new job, mother w/ dementia; she does not require anxiolytic meds... EXAM shows Afeb, VSS, O2sat=98% on RA;  HEENT- neg, mallampati1;  Chest- clear w/o w/r/r;  Heart- RR w/o m/r/g;  Abd- soft, nontender, neg;  GYN- DrNeal;  Ext- neg w/o c/c/e;  Neuro- intact...   LABS 01/18/17>  FLP- TChol=146, TG=106, HDL=54, LDL=71;  Chems- ok w/ K=3.9, BS=84, Cr=0.89, LFTs wnl;  TSH=0.85 IMP/PLAN>>  Amber Stephens enjoys excellent general medical health, requiring no regular meds, just some prns; DrNeal plans hysterectomy in 2019; we reviewed diet/ exercise, weight reduction strategies...             Problem List:    ALLERGIC RHINITIS (ICD-477.9) - hx allergy testing yrs ago by DrTBratton w/ +reactions to pollen, trees, grasses... she uses OTC antihist Prn...  ASTHMA (ICD-493.90) - she is a never smoker, hx asthma and AB x yrs... on PROAIR inhaler Prn... triggers include upper resp infections & springtime allergen exposures... occas requires Depo/ Pred (none for yrs until 4/13)- she was on Vanceril inhaler in the 90's & stopped on her own... PFT 6/11 w/ mild persist asthma w/ obstruction on PFT >> we discussed starting & staying on ADVAIR 250- Bid regularly... ~  baseline CXR showed clear, NAD... ~  PFT 6/06 showed FVC=2.84 (100%), FEV1=2.00 (82%), %1sec=71, mid-flows=45%pred. ~  PFT 6/11 showed FVC=2.28 (78%), FEV1=1.64 (66%), %1sec=72, mid-flows=38%pred. ~  4/13:  Asthma exac treated by TP w/ Pred & restart [id number]- asked to stay on this regularly... ~  CXR 7/13 showed normal heart size, clear lungs, NAD... ~  9/14: stable on Advair250 once daily & hasn't needed the rescue inhaler... ~  11/15: stable off the Advair now for >21mo & we decided to leave it off for now & use Proair HFA rescue inhaler as needed... ~  CXR 11/15 showed normal heart size, clear lungs, NAD... ~  4/16: called w/ asthma exac due to the pollen etc; she has been off her Advair x15mo; Rx w/ Pred taper & restart the Advair250Bid...  Hx of PNEUMONIA (ICD-486) - hx LLL CAP in 1997 resolved w/ Cephalosporin Rx + ZPak...  Hx of GASTRITIS (ICD-535.50) - remote hx gastritis treated w/ H2Blockers at the time... denies dysphagia, heartburn, indigestion, n/v, abd pain, etc...  ANEMIA >> rec to take FeSO4 daily... ~  Labs 7/13 showed Hg= 10.9, MCV= 81... Needs daily Fe supplement...  ~  Labs 9/14 showed Hg=  12.6;  Fe=106 (29%) ~  Labs 11/15 showed Hg= 13.6  Health Maintenance:  GYN= DrNeal et al... GI= DrStark yrs ago (she works at [employer])...  ~  Immuniz:  She gets the yearly Flu shot at work... Given TDAP  11/15 here...    Past Surgical History:  Procedure Laterality Date  . BTL  10/10   Dr Corinna Capra  . CESAREAN SECTION    . DILATION AND CURETTAGE OF UTERUS  07/02/2012    Outpatient Encounter Medications as of 01/18/2017  Medication Sig  . albuterol (PROAIR HFA) 108 (90 Base) MCG/ACT inhaler 1-2 puffs by mouth every 4 hours as needed   No facility-administered encounter medications on file as of 01/18/2017.     No Known Allergies   Immunization History  Administered Date(s) Administered  . Influenza Split 11/15/2011, 11/16/2013  . Influenza Whole 11/14/2008, 11/02/2010  . Influenza,inj,Quad PF,6+ Mos 12/05/2014, 11/03/2015, 11/01/2016  . Tdap 12/03/2013    Current Medications, Allergies, Past Medical History, Past Surgical History, Family History, and Social History were reviewed in [employer] record.   Review of Systems        See HPI - all other systems neg except as noted... The patient denies anorexia, fever, weight loss, weight gain, vision loss, decreased hearing, hoarseness, chest pain, syncope, dyspnea on exertion, peripheral edema, prolonged cough, headaches, hemoptysis, abdominal pain, melena, hematochezia, severe indigestion/heartburn, hematuria, incontinence, muscle weakness, suspicious skin lesions, transient blindness, difficulty walking, depression, unusual weight change, abnormal bleeding, enlarged lymph nodes, and angioedema.     Objective:   Physical Exam     WD, WN, 41 y/o BF in NAD... GENERAL:  Alert & oriented; pleasant & cooperative... HEENT:  Clare/AT, EOM-wnl, PERRLA, Fundi-benign, EACs-clear, TMs-wnl, NOSE-clear, THROAT-clear & wnl. NECK:  Supple w/ full ROM; no JVD; normal carotid impulses w/o bruits; no thyromegaly or nodules palpated; no lymphadenopathy. CHEST:  Sl end-exp wheezing & basilar rhonchi w/o consolid... HEART:  Regular Rhythm; without murmurs/ rubs/ or gallops. ABDOMEN:  Soft & nontender; normal bowel sounds; no  organomegaly or masses detected. EXT: without deformities or arthritic changes; no varicose veins/ venous insuffic/ or edema. NEURO:  CN's intact; motor testing normal; sensory testing normal; gait normal & balance OK. DERM:  No lesions noted; no rash etc...  RADIOLOGY DATA:  Reviewed in the EPIC EMR & discussed w/ the patient...  LABORATORY DATA:  Reviewed in the EPIC EMR & discussed w/ the patient...   Assessment:      CPX 01/08/16 >> Amber Stephens has stopped her Advair on her own & doing well w/ prn rescue Albuterol (hasn't needed)- we will continue to monitor;  Stable medically & we reviewed diet/ exercise recommendations; she already had the 2017 FLU vaccine; call for any problems & we plan f/u 40yr. 01/18/17>  Amber Stephens enjoys excellent general medical health, requiring no regular meds, just some prns; DrNeal plans hysterectomy in 2019; we reviewed diet/ exercise, weight reduction strategies...    AR/ Asthma>  Asthma exac due to pollen, allergies; Rx w/ Pred taper & once again asked to stay on a controller drug like ADVAIR 250Bid regularly & use the Proair HFA as needed...  Hx Gastritis>  Aware- no prob w/ chr reflux exacerbating her asthma etc; uses OTC Pepcid/ Zantac prn...  ANEMIA>  Hg= 12.8 w/ MCV=89...     Plan:       Medication List        Accurate as of 01/18/17  3:51 PM. Always use your most recent  med list.          albuterol 108 (90 Base) MCG/ACT inhaler Commonly known as:  PROAIR HFA 1-2 puffs by mouth every 4 hours as needed

## 2017-01-18 NOTE — Patient Instructions (Signed)
Today we updated your med list in our EPIC system...    Continue your current medications the same...  Today we did your follow up FASTING blood work...    We will contact you w/ the results when available...   Keep up the good work w/ your diet & exercise program...    The goal is to lose 15-20 lbs...  Call for any questions or if we can be of service in any way...  Let's plan a follow up visit in 72yr, sooner if needed for problems.Marland KitchenMarland Kitchen

## 2017-01-19 ENCOUNTER — Telehealth: Payer: Self-pay | Admitting: Pulmonary Disease

## 2017-01-20 NOTE — Telephone Encounter (Signed)
Pt aware of lab results.  nothing further needed.

## 2017-02-01 ENCOUNTER — Other Ambulatory Visit: Payer: Self-pay | Admitting: Pulmonary Disease

## 2017-06-02 NOTE — Patient Instructions (Addendum)
Amber Stephens  06/02/2017      Your procedure is scheduled on 06/13/2017   Report to [place].M.  Call this number if you have problems the morning of surgery:[phone]  OUR ADDRESS IS [address], WE ARE LOCATED IN THE [facility]ICAL [facility] WITH [facility].   Remember:  Do not eat food or drink liquids after midnight.  Take these medicines the morning of surgery with A SIP OF WATER : Use Inhaler if needed and bring inhaler with you     Do not wear jewelry, make-up or nail polish.  Do not wear lotions, powders, or perfumes, or deoderant.  Do not shave 48 hours prior to surgery.  Men may shave face and neck.  Do not bring valuables to the hospital.  [facility] is not responsible for any belongings or valuables.  Contacts, dentures or bridgework may not be worn into surgery.  Leave your suitcase in the car.  After surgery it may be brought to your room.  For patients admitted to the hospital, discharge time will be determined by your treatment team.  Patients discharged the day of surgery will not be allowed to drive home.   Special instructions:    Please read over the following fact sheets that you were given:       [facility] - Preparing for Surgery Before surgery, you can play an important role.  Because skin is not sterile, your skin needs to be as free of germs as possible.  You can reduce the number of germs on your skin by washing with CHG (chlorahexidine gluconate) soap before surgery.  CHG is an antiseptic cleaner which kills germs and bonds with the skin to continue killing germs even after washing. Please DO NOT use if you have an allergy to CHG or antibacterial soaps.  If your skin becomes reddened/irritated stop using the CHG and inform your nurse when you arrive at Short Stay. Do not shave (including legs and underarms) for at least 48 hours prior to the first CHG shower.  You may shave your face/neck. Please follow  these instructions carefully:  1.  Shower with CHG Soap the night before surgery and the  morning of Surgery.  2.  If you choose to wash your hair, wash your hair first as usual with your  normal  shampoo.  3.  After you shampoo, rinse your hair and body thoroughly to remove the  shampoo.                           4.  Use CHG as you would any other liquid soap.  You can apply chg directly  to the skin and wash                       Gently with a scrungie or clean washcloth.  5.  Apply the CHG Soap to your body ONLY FROM THE NECK DOWN.   Do not use on face/ open                           Wound or open sores. Avoid contact with eyes, ears mouth and genitals (private parts).                       Wash face,  Genitals (private parts) with  your normal soap.             6.  Wash thoroughly, paying special attention to the area where your surgery  will be performed.  7.  Thoroughly rinse your body with warm water from the neck down.  8.  DO NOT shower/wash with your normal soap after using and rinsing off  the CHG Soap.                9.  Pat yourself dry with a clean towel.            10.  Wear clean pajamas.            11.  Place clean sheets on your bed the night of your first shower and do not  sleep with pets. Day of Surgery : Do not apply any lotions/deodorants the morning of surgery.  Please wear clean clothes to the hospital/surgery center.  FAILURE TO FOLLOW THESE INSTRUCTIONS MAY RESULT IN THE CANCELLATION OF YOUR SURGERY PATIENT SIGNATURE_________________________________  NURSE SIGNATURE__________________________________  ________________________________________________________________________

## 2017-06-07 ENCOUNTER — Encounter (HOSPITAL_COMMUNITY): Payer: Self-pay

## 2017-06-07 ENCOUNTER — Encounter (HOSPITAL_COMMUNITY)
Admission: RE | Admit: 2017-06-07 | Discharge: 2017-06-07 | Disposition: A | Discharge status: Home / Self Care | Payer: BLUE CROSS/BLUE SHIELD | Source: Ambulatory Visit | Attending: Obstetrics & Gynecology | Admitting: Obstetrics & Gynecology

## 2017-06-07 ENCOUNTER — Other Ambulatory Visit: Payer: Self-pay

## 2017-06-07 DIAGNOSIS — Z01812 Encounter for preprocedural laboratory examination: Secondary | ICD-10-CM | POA: Insufficient documentation

## 2017-06-07 HISTORY — DX: Anemia, unspecified: D64.9

## 2017-06-07 LAB — BASIC METABOLIC PANEL
ANION GAP: 6 (ref 5–15)
BUN: 13 mg/dL (ref 6–20)
CHLORIDE: 107 mmol/L (ref 101–111)
CO2: 24 mmol/L (ref 22–32)
Calcium: 9 mg/dL (ref 8.9–10.3)
Creatinine, Ser: 0.89 mg/dL (ref 0.44–1.00)
GFR calc Af Amer: >60 mL/min (ref >60)
GFR calc non Af Amer: >60 mL/min (ref >60)
GLUCOSE: 91 mg/dL (ref 65–99)
Potassium: 3.9 mmol/L (ref 3.5–5.1)
Sodium: 137 mmol/L (ref 135–145)

## 2017-06-07 LAB — CBC
HCT: 38.8 % (ref 36.0–46.0)
HEMOGLOBIN: 12.7 g/dL (ref 12.0–15.0)
MCH: 29.2 pg (ref 26.0–34.0)
MCHC: 32.7 g/dL (ref 30.0–36.0)
MCV: 89.2 fL (ref 78.0–100.0)
Platelets: 375 10*3/uL (ref 150–400)
RBC: 4.35 MIL/uL (ref 3.87–5.11)
RDW: 13.8 % (ref 11.5–15.5)
WBC: 7.3 10*3/uL (ref 4.0–10.5)

## 2017-06-07 LAB — ABO/RH: ABO/RH(D): A POS

## 2017-06-08 DIAGNOSIS — N946 Dysmenorrhea, unspecified: Secondary | ICD-10-CM | POA: Diagnosis not present

## 2017-06-08 DIAGNOSIS — N920 Excessive and frequent menstruation with regular cycle: Secondary | ICD-10-CM | POA: Diagnosis not present

## 2017-06-08 DIAGNOSIS — D259 Leiomyoma of uterus, unspecified: Secondary | ICD-10-CM | POA: Diagnosis not present

## 2017-06-13 LAB — TYPE AND SCREEN
ABO/RH(D): A POS
Antibody Screen: NEGATIVE

## 2017-06-15 NOTE — H&P (Signed)
NAME: Amber Stephens, Amber Stephens MEDICAL RECORD [id number] ACCOUNT [id number] DATE OF BIRTH:06-Aug-1975 FACILITY: [facility] LOCATION: [id number] PHYSICIAN:W. Evette Cristal, MD  HISTORY AND PHYSICAL  DATE OF ADMISSION:  06/16/2017  ADMITTING DIAGNOSES:  Uterine leiomyomata, numerous, large; secondary amenorrhea, dysmenorrhea.  HISTORY OF PRESENT ILLNESS:  The patient is a 42 year old black married female, gravida 4, para 4, who has had a bilateral tubal ligation for contraception.  She is known to have uterine leiomyomata for a number of years, including previous myomectomy.   She has numerous large uterine leiomyomata with secondary dysmenorrhea and menorrhagia and has requested definitive surgical intervention.  She is admitted at this time for total abdominal hysterectomy, bilateral salpingectomy.  She has requested keeping  her ovaries, and that will be honored.  CURRENT REVIEW OF SYSTEMS:  Otherwise, negative.  No cardiopulmonary, GI or GU complaints.  PAST MEDICAL HISTORY:  The patient has had 4 cesarean deliveries.  She has no other known significant surgical procedures, no known medical illnesses.  ALLERGIES:  He has no known allergies to medications.  SOCIAL HISTORY:  She is not a cigarette smoker.  FAMILY HISTORY:  Noncontributory.  PHYSICAL EXAMINATION: VITAL SIGNS:  The patient is 5 feet 2 inches tall, weighs 179 pounds.  Her blood pressure in the office was 138/90. GENERAL:  She is a well-nourished, well-developed female in no acute distress. NECK:  Thyroid gland is not palpably enlarged to my examination. BREASTS:  Normal.  No palpable masses.  No evidence of skin change or nipple discharge. ABDOMEN:  Soft and nontender, without appreciable organomegaly except for the palpable uterine leiomyomata and uterus palpable above the symphysis. EXTREMITIES:  Without cyanosis, clubbing or edema. PELVIC:  Remarkable only for enlargement of the uterus to approximately 12 weeks' gestational  size.  ASSESSMENT:  Symptomatic uterine leiomyomata.  PLAN:  Total abdominal hysterectomy, bilateral salpingectomy.  She is to receive perioperative antibiotics.  LN/NUANCE  D:06/14/2017 T:06/14/2017 JOB:[id number]

## 2017-06-16 ENCOUNTER — Inpatient Hospital Stay (HOSPITAL_BASED_OUTPATIENT_CLINIC_OR_DEPARTMENT_OTHER)
Admission: RE | Admit: 2017-06-16 | Discharge: 2017-06-17 | DRG: 743 | Disposition: A | Discharge status: Home / Self Care | Payer: BLUE CROSS/BLUE SHIELD | Attending: Obstetrics & Gynecology | Admitting: Obstetrics & Gynecology

## 2017-06-16 ENCOUNTER — Ambulatory Visit (HOSPITAL_BASED_OUTPATIENT_CLINIC_OR_DEPARTMENT_OTHER): Payer: BLUE CROSS/BLUE SHIELD | Admitting: Anesthesiology

## 2017-06-16 ENCOUNTER — Encounter (HOSPITAL_COMMUNITY)
Admission: RE | Disposition: A | Discharge status: Home / Self Care | Payer: Self-pay | Source: Home / Self Care | Attending: Obstetrics & Gynecology

## 2017-06-16 ENCOUNTER — Encounter (HOSPITAL_BASED_OUTPATIENT_CLINIC_OR_DEPARTMENT_OTHER): Payer: Self-pay | Admitting: *Deleted

## 2017-06-16 ENCOUNTER — Other Ambulatory Visit: Payer: Self-pay

## 2017-06-16 DIAGNOSIS — J45909 Unspecified asthma, uncomplicated: Secondary | ICD-10-CM | POA: Diagnosis not present

## 2017-06-16 DIAGNOSIS — K2990 Gastroduodenitis, unspecified, without bleeding: Secondary | ICD-10-CM | POA: Diagnosis not present

## 2017-06-16 DIAGNOSIS — Z9851 Tubal ligation status: Secondary | ICD-10-CM

## 2017-06-16 DIAGNOSIS — N946 Dysmenorrhea, unspecified: Secondary | ICD-10-CM | POA: Diagnosis present

## 2017-06-16 DIAGNOSIS — D259 Leiomyoma of uterus, unspecified: Principal | ICD-10-CM | POA: Diagnosis present

## 2017-06-16 DIAGNOSIS — N920 Excessive and frequent menstruation with regular cycle: Secondary | ICD-10-CM | POA: Diagnosis not present

## 2017-06-16 DIAGNOSIS — Z90710 Acquired absence of both cervix and uterus: Secondary | ICD-10-CM | POA: Diagnosis present

## 2017-06-16 HISTORY — PX: ABDOMINAL HYSTERECTOMY: SHX81

## 2017-06-16 HISTORY — PX: BILATERAL SALPINGECTOMY: SHX5743

## 2017-06-16 LAB — POCT PREGNANCY, URINE
PREG TEST UR: NEGATIVE
Preg Test, Ur: NEGATIVE

## 2017-06-16 SURGERY — HYSTERECTOMY, ABDOMINAL
Anesthesia: General

## 2017-06-16 MED ORDER — HYDROMORPHONE HCL 1 MG/ML IJ SOLN
INTRAMUSCULAR | Status: AC
  Filled 2017-06-16: qty 1

## 2017-06-16 MED ORDER — EPHEDRINE SULFATE 50 MG/ML IJ SOLN
INTRAMUSCULAR | Status: DC | PRN
  Administered 2017-06-16: 10 mg via INTRAVENOUS

## 2017-06-16 MED ORDER — MIDAZOLAM HCL 2 MG/2ML IJ SOLN
INTRAMUSCULAR | Status: AC
  Filled 2017-06-16: qty 2

## 2017-06-16 MED ORDER — ALUM & MAG HYDROXIDE-SIMETH 200-200-20 MG/5ML PO SUSP: 30.0000 mL | ORAL | Status: DC | PRN

## 2017-06-16 MED ORDER — SODIUM CHLORIDE 0.9 % IR SOLN
Status: DC | PRN
  Administered 2017-06-16: 2000 mL

## 2017-06-16 MED ORDER — ROCURONIUM BROMIDE 10 MG/ML (PF) SYRINGE
PREFILLED_SYRINGE | INTRAVENOUS | Status: DC | PRN
  Administered 2017-06-16: 10 mg via INTRAVENOUS
  Administered 2017-06-16: 40 mg via INTRAVENOUS

## 2017-06-16 MED ORDER — LACTATED RINGERS IV SOLN
INTRAVENOUS | Status: DC
  Administered 2017-06-16 (×2): via INTRAVENOUS
  Filled 2017-06-16: qty 1000

## 2017-06-16 MED ORDER — ONDANSETRON HCL 4 MG/2ML IJ SOLN
INTRAMUSCULAR | Status: DC | PRN
  Administered 2017-06-16: 4 mg via INTRAVENOUS

## 2017-06-16 MED ORDER — IBUPROFEN 200 MG PO TABS
600.0000 mg | ORAL_TABLET | Freq: Four times a day (QID) | ORAL | Status: DC | PRN
  Filled 2017-06-16: qty 3

## 2017-06-16 MED ORDER — ONDANSETRON HCL 4 MG/2ML IJ SOLN: 4.0000 mg | Freq: Four times a day (QID) | INTRAMUSCULAR | Status: DC | PRN

## 2017-06-16 MED ORDER — ZOLPIDEM TARTRATE 5 MG PO TABS: 5.0000 mg | ORAL_TABLET | Freq: Every evening | ORAL | Status: DC | PRN

## 2017-06-16 MED ORDER — HYDROMORPHONE HCL 1 MG/ML IJ SOLN: 0.2000 mg | INTRAMUSCULAR | Status: DC | PRN

## 2017-06-16 MED ORDER — SUGAMMADEX SODIUM 200 MG/2ML IV SOLN
INTRAVENOUS | Status: DC | PRN
  Administered 2017-06-16: 200 mg via INTRAVENOUS

## 2017-06-16 MED ORDER — EPHEDRINE 5 MG/ML INJ
INTRAVENOUS | Status: AC
  Filled 2017-06-16: qty 10

## 2017-06-16 MED ORDER — OXYCODONE HCL 5 MG PO TABS
5.0000 mg | ORAL_TABLET | ORAL | Status: DC | PRN
  Administered 2017-06-17: 5 mg via ORAL
  Filled 2017-06-16 (×2): qty 1

## 2017-06-16 MED ORDER — DEXAMETHASONE SODIUM PHOSPHATE 10 MG/ML IJ SOLN
INTRAMUSCULAR | Status: AC
  Filled 2017-06-16: qty 1

## 2017-06-16 MED ORDER — DEXTROSE-NACL 5-0.45 % IV SOLN
INTRAVENOUS | Status: DC
  Administered 2017-06-16 (×2): via INTRAVENOUS

## 2017-06-16 MED ORDER — MIDAZOLAM HCL 2 MG/2ML IJ SOLN
INTRAMUSCULAR | Status: DC | PRN
  Administered 2017-06-16: 2 mg via INTRAVENOUS

## 2017-06-16 MED ORDER — PROPOFOL 10 MG/ML IV BOLUS
INTRAVENOUS | Status: DC | PRN
  Administered 2017-06-16: 170 mg via INTRAVENOUS

## 2017-06-16 MED ORDER — NALOXONE HCL 0.4 MG/ML IJ SOLN: 0.4000 mg | INTRAMUSCULAR | Status: DC | PRN

## 2017-06-16 MED ORDER — DIPHENHYDRAMINE HCL 12.5 MG/5ML PO ELIX: 12.5000 mg | ORAL_SOLUTION | Freq: Four times a day (QID) | ORAL | Status: DC | PRN

## 2017-06-16 MED ORDER — HYDROMORPHONE HCL 1 MG/ML IJ SOLN
0.2500 mg | INTRAMUSCULAR | Status: DC | PRN
  Administered 2017-06-16: 0.25 mg via INTRAVENOUS
  Filled 2017-06-16: qty 0.5

## 2017-06-16 MED ORDER — FENTANYL CITRATE (PF) 100 MCG/2ML IJ SOLN
INTRAMUSCULAR | Status: DC | PRN
  Administered 2017-06-16: 50 ug via INTRAVENOUS
  Administered 2017-06-16: 100 ug via INTRAVENOUS
  Administered 2017-06-16: 50 ug via INTRAVENOUS

## 2017-06-16 MED ORDER — FENTANYL CITRATE (PF) 250 MCG/5ML IJ SOLN
INTRAMUSCULAR | Status: AC
  Filled 2017-06-16: qty 5

## 2017-06-16 MED ORDER — SODIUM CHLORIDE 0.9 % IV SOLN
INTRAVENOUS | Status: DC | PRN
  Administered 2017-06-16: 40 mL

## 2017-06-16 MED ORDER — CEFOTETAN DISODIUM-DEXTROSE 2-2.08 GM-%(50ML) IV SOLR
INTRAVENOUS | Status: AC
  Filled 2017-06-16: qty 50

## 2017-06-16 MED ORDER — ONDANSETRON HCL 4 MG/2ML IJ SOLN
INTRAMUSCULAR | Status: AC
  Filled 2017-06-16: qty 2

## 2017-06-16 MED ORDER — PROPOFOL 10 MG/ML IV BOLUS
INTRAVENOUS | Status: AC
  Filled 2017-06-16: qty 40

## 2017-06-16 MED ORDER — DIPHENHYDRAMINE HCL 50 MG/ML IJ SOLN: 12.5000 mg | Freq: Four times a day (QID) | INTRAMUSCULAR | Status: DC | PRN

## 2017-06-16 MED ORDER — SODIUM CHLORIDE 0.9 % IV SOLN
2.0000 g | INTRAVENOUS | Status: AC
  Administered 2017-06-16: 2 g via INTRAVENOUS
  Filled 2017-06-16: qty 2

## 2017-06-16 MED ORDER — MENTHOL 3 MG MT LOZG: 1.0000 | LOZENGE | OROMUCOSAL | Status: DC | PRN

## 2017-06-16 MED ORDER — HYDROMORPHONE 1 MG/ML IV SOLN
INTRAVENOUS | Status: DC
  Administered 2017-06-16: 0.3 mg via INTRAVENOUS
  Administered 2017-06-16: 0.2 mg via INTRAVENOUS
  Administered 2017-06-16: 1.6 mg via INTRAVENOUS
  Filled 2017-06-16 (×3): qty 25

## 2017-06-16 MED ORDER — ACETAMINOPHEN 10 MG/ML IV SOLN
INTRAVENOUS | Status: DC | PRN
  Administered 2017-06-16: 1000 mg via INTRAVENOUS

## 2017-06-16 MED ORDER — SODIUM CHLORIDE 0.9% FLUSH: 9.0000 mL | INTRAVENOUS | Status: DC | PRN

## 2017-06-16 MED ORDER — DEXAMETHASONE SODIUM PHOSPHATE 10 MG/ML IJ SOLN
INTRAMUSCULAR | Status: DC | PRN
  Administered 2017-06-16: 10 mg via INTRAVENOUS

## 2017-06-16 MED ORDER — ACETAMINOPHEN 10 MG/ML IV SOLN
INTRAVENOUS | Status: AC
  Filled 2017-06-16: qty 100

## 2017-06-16 MED ORDER — SIMETHICONE 80 MG PO CHEW: 80.0000 mg | CHEWABLE_TABLET | Freq: Four times a day (QID) | ORAL | Status: DC | PRN

## 2017-06-16 MED ORDER — LIDOCAINE 2% (20 MG/ML) 5 ML SYRINGE
INTRAMUSCULAR | Status: DC | PRN
  Administered 2017-06-16: 60 mg via INTRAVENOUS

## 2017-06-16 SURGICAL SUPPLY — 54 items
BAG URINE DRAINAGE (UROLOGICAL SUPPLIES) ×4 IMPLANT
BAG URINE LEG 19OZ MD ST LTX (BAG) ×4 IMPLANT
BENZOIN TINCTURE PRP APPL 2/3 (GAUZE/BANDAGES/DRESSINGS) IMPLANT
BLADE EXTENDED COATED 6.5IN (ELECTRODE) IMPLANT
CANISTER SUCT 3000ML PPV (MISCELLANEOUS) ×4 IMPLANT
CATH FOLEY 2WAY SLVR  5CC 14FR (CATHETERS) ×2
CATH FOLEY 2WAY SLVR 5CC 14FR (CATHETERS) ×2 IMPLANT
CLOSURE WOUND 1/4X4 (GAUZE/BANDAGES/DRESSINGS) ×1
COVER BACK TABLE 60X90IN (DRAPES) ×4 IMPLANT
COVER MAYO STAND STRL (DRAPES) ×4 IMPLANT
DECANTER SPIKE VIAL GLASS SM (MISCELLANEOUS) IMPLANT
DERMABOND ADVANCED (GAUZE/BANDAGES/DRESSINGS)
DERMABOND ADVANCED .7 DNX12 (GAUZE/BANDAGES/DRESSINGS) IMPLANT
DRAPE LAPAROTOMY T 102X78X121 (DRAPES) ×4 IMPLANT
DRSG OPSITE POSTOP 4X10 (GAUZE/BANDAGES/DRESSINGS) ×4 IMPLANT
DRSG TELFA 3X8 NADH (GAUZE/BANDAGES/DRESSINGS) IMPLANT
ELECT REM PT RETURN 9FT ADLT (ELECTROSURGICAL) ×4
ELECTRODE REM PT RTRN 9FT ADLT (ELECTROSURGICAL) ×2 IMPLANT
GAUZE SPONGE 4X4 12PLY STRL (GAUZE/BANDAGES/DRESSINGS) ×4 IMPLANT
GLOVE BIO SURGEON STRL SZ7.5 (GLOVE) ×4 IMPLANT
GOWN STRL REUS W/ TWL LRG LVL3 (GOWN DISPOSABLE) ×6 IMPLANT
GOWN STRL REUS W/ TWL XL LVL3 (GOWN DISPOSABLE) ×2 IMPLANT
GOWN STRL REUS W/TWL LRG LVL3 (GOWN DISPOSABLE) ×6
GOWN STRL REUS W/TWL XL LVL3 (GOWN DISPOSABLE) ×2
HEMOSTAT ARISTA ABSORB 3G PWDR (MISCELLANEOUS) ×4 IMPLANT
KIT TURNOVER CYSTO (KITS) ×4 IMPLANT
LIGASURE IMPACT 36 18CM CVD LR (INSTRUMENTS) ×4 IMPLANT
MANIFOLD NEPTUNE II (INSTRUMENTS) IMPLANT
NS IRRIG 500ML POUR BTL (IV SOLUTION) ×16 IMPLANT
PAD OB MATERNITY 4.3X12.25 (PERSONAL CARE ITEMS) ×4 IMPLANT
SPONGE LAP 18X18 X RAY DECT (DISPOSABLE) ×16 IMPLANT
SPONGE SURGIFOAM ABS GEL 12-7 (HEMOSTASIS) IMPLANT
STAPLER VISISTAT 35W (STAPLE) ×4 IMPLANT
STRIP CLOSURE SKIN 1/4X4 (GAUZE/BANDAGES/DRESSINGS) ×3 IMPLANT
SUT CHROMIC 2 0 SH (SUTURE) ×8 IMPLANT
SUT CHROMIC 3 0 SH 27 (SUTURE) IMPLANT
SUT CHROMIC 3 0 TIES (SUTURE) IMPLANT
SUT ETHILON 3 0 FSL (SUTURE) IMPLANT
SUT MNCRL 0 MO-4 VIOLET 18 CR (SUTURE) ×6 IMPLANT
SUT MNCRL 0 VIOLET 6X18 (SUTURE) ×4 IMPLANT
SUT MNCRL AB 0 CT1 27 (SUTURE) ×8 IMPLANT
SUT MON AB 2-0 CT1 27 (SUTURE) ×8 IMPLANT
SUT MONOCRYL 0 6X18 (SUTURE) ×4
SUT MONOCRYL 0 MO 4 18  CR/8 (SUTURE) ×6
SUT PDS AB 0 CTX 60 (SUTURE) ×8 IMPLANT
SUT PLAIN 2 0 XLH (SUTURE) ×4 IMPLANT
SUT PROLENE 3 0 FS 2 (SUTURE) IMPLANT
SYR BULB IRRIGATION 50ML (SYRINGE) ×4 IMPLANT
SYR TB 1ML 25GX5/8 (SYRINGE) ×4 IMPLANT
TOWEL OR 17X24 6PK STRL BLUE (TOWEL DISPOSABLE) ×12 IMPLANT
TUBE CONNECTING 12'X1/4 (SUCTIONS) ×1
TUBE CONNECTING 12X1/4 (SUCTIONS) ×3 IMPLANT
WATER STERILE IRR 500ML POUR (IV SOLUTION) ×4 IMPLANT
YANKAUER SUCT BULB TIP NO VENT (SUCTIONS) ×4 IMPLANT

## 2017-06-16 NOTE — Transfer of Care (Signed)
Immediate Anesthesia Transfer of Care Note  Patient: Amber Stephens  Procedure(s) Performed: HYSTERECTOMY ABDOMINAL TOTAL (N/A ) BILATERAL SALPINGECTOMY (Bilateral )  Patient Location: PACU  Anesthesia Type:General  Level of Consciousness: awake, alert , oriented and patient cooperative  Airway & Oxygen Therapy: Patient Spontanous Breathing and Patient connected to nasal cannula oxygen  Post-op Assessment: Report given to RN and Post -op Vital signs reviewed and stable  Post vital signs: Reviewed and stable  Last Vitals:  Vitals Value Taken Time  BP 146/98 06/16/2017  9:15 AM  Temp    Pulse 66 06/16/2017  9:15 AM  Resp    SpO2 100 % 06/16/2017  9:15 AM  Vitals shown include unvalidated device data.  Last Pain:  Vitals:   06/16/17 0540  TempSrc: Oral      Patients Stated Pain Goal: 10 (26/83/41 9622)  Complications: No apparent anesthesia complications

## 2017-06-16 NOTE — Anesthesia Procedure Notes (Signed)
Procedure Name: Intubation Date/Time: 06/16/2017 7:36 AM Performed by: Wanita Chamberlain, CRNA Pre-anesthesia Checklist: Patient identified, Timeout performed, Emergency Drugs available, Suction available and Patient being monitored Patient Re-evaluated:Patient Re-evaluated prior to induction Oxygen Delivery Method: Circle system utilized Preoxygenation: Pre-oxygenation with 100% oxygen Induction Type: IV induction Ventilation: Mask ventilation without difficulty Laryngoscope Size: Mac and 3 Grade View: Grade I Tube type: Oral Tube size: 7.0 mm Number of attempts: 1 Airway Equipment and Method: Stylet Placement Confirmation: ETT inserted through vocal cords under direct vision,  positive ETCO2,  CO2 detector and breath sounds checked- equal and bilateral Secured at: 22 cm Tube secured with: Tape Dental Injury: Teeth and Oropharynx as per pre-operative assessment

## 2017-06-16 NOTE — Anesthesia Postprocedure Evaluation (Signed)
Anesthesia Post Note  Patient: Amber Stephens  Procedure(s) Performed: HYSTERECTOMY ABDOMINAL TOTAL (N/A ) BILATERAL SALPINGECTOMY (Bilateral )     Patient location during evaluation: PACU Anesthesia Type: General Level of consciousness: awake Pain management: pain level controlled Vital Signs Assessment: post-procedure vital signs reviewed and stable Respiratory status: spontaneous breathing Cardiovascular status: stable Anesthetic complications: no    Last Vitals:  Vitals:   06/16/17 1142 06/16/17 1157  BP: 129/84   Pulse: 74   Resp: 16 15  Temp:    SpO2: 100% 100%    Last Pain:  Vitals:   06/16/17 1157  TempSrc:   PainSc: Asleep                 Rjay Revolorio

## 2017-06-16 NOTE — Anesthesia Preprocedure Evaluation (Addendum)
Anesthesia Evaluation  Patient identified by MRN, date of birth, ID band Patient awake    Reviewed: Allergy & Precautions, NPO status , Patient's Chart, lab work & pertinent test results  Airway Mallampati: II  TM Distance: >3 FB Neck ROM: Full    Dental  (+) Teeth Intact, Dental Advisory Given   Pulmonary asthma ,    breath sounds clear to auscultation       Cardiovascular negative cardio ROS   Rhythm:Regular Rate:Normal     Neuro/Psych    GI/Hepatic negative GI ROS, Neg liver ROS,   Endo/Other    Renal/GU      Musculoskeletal   Abdominal   Peds  Hematology   Anesthesia Other Findings   Reproductive/Obstetrics                            Anesthesia Physical Anesthesia Plan  ASA: II  Anesthesia Plan: General   Post-op Pain Management:    Induction: Intravenous  PONV Risk Score and Plan: Treatment may vary due to age or medical condition, Midazolam, Dexamethasone and Ondansetron  Airway Management Planned: Oral ETT  Additional Equipment:   Intra-op Plan:   Post-operative Plan: Extubation in OR  Informed Consent: I have reviewed the patients History and Physical, chart, labs and discussed the procedure including the risks, benefits and alternatives for the proposed anesthesia with the patient or authorized representative who has indicated his/her understanding and acceptance.   Dental advisory given  Plan Discussed with: CRNA and Anesthesiologist  Anesthesia Plan Comments:         Anesthesia Quick Evaluation

## 2017-06-17 ENCOUNTER — Encounter (HOSPITAL_BASED_OUTPATIENT_CLINIC_OR_DEPARTMENT_OTHER): Payer: Self-pay | Admitting: Obstetrics & Gynecology

## 2017-06-17 LAB — CBC
HEMATOCRIT: 37.1 % (ref 36.0–46.0)
HEMOGLOBIN: 12 g/dL (ref 12.0–15.0)
MCH: 28.8 pg (ref 26.0–34.0)
MCHC: 32.3 g/dL (ref 30.0–36.0)
MCV: 89 fL (ref 78.0–100.0)
Platelets: 376 10*3/uL (ref 150–400)
RBC: 4.17 MIL/uL (ref 3.87–5.11)
RDW: 13.8 % (ref 11.5–15.5)
WBC: 18.6 10*3/uL — ABNORMAL HIGH (ref 4.0–10.5)

## 2017-06-17 MED ORDER — ONDANSETRON 4 MG PO TBDP
4.0000 mg | ORAL_TABLET | Freq: Three times a day (TID) | ORAL | Status: DC | PRN
  Administered 2017-06-17: 4 mg via ORAL
  Filled 2017-06-17: qty 1

## 2017-06-17 NOTE — Plan of Care (Signed)
Pt alert and oriented, mild nausea with activity relieved by Zofran.  Pain well managed with PO pain meds.  To d/c home per MD order today with husband.  Pt has d/c prescriptions and follow-up appt scheduled. RN will monitor.

## 2017-06-17 NOTE — Progress Notes (Signed)
Discharge summary discussed with patient and her husband.  Questions were answered and they both demonstrated understanding.  Pt escorted down to the main lobby in stable condition.

## 2017-06-17 NOTE — Op Note (Signed)
NAME: Amber Stephens, Amber Stephens MEDICAL RECORD [id number] ACCOUNT [id number] DATE OF BIRTH:December 25, 1975 FACILITY[facility] LOCATION: [facility] PHYSICIAN:Jameah Rouser. Evette Cristal, MD  OPERATIVE REPORT  DATE OF PROCEDURE:  06/16/2017  PREOPERATIVE DIAGNOSIS:  Numerous and large fibroids with clinical symptoms of menorrhagia.  POSTOPERATIVE DIAGNOSIS:  Numerous and large fibroids with clinical symptoms of menorrhagia.  OPERATIVE PROCEDURE:  Total abdominal hysterectomy, bilateral salpingectomy.  ESTIMATED INTRAOPERATIVE BLOOD LOSS:  350 mL.  SURGEON:  Maisie Fus, MD  ASSISTANT: Louretta Shorten, MD  INTRAOPERATIVE COMPLICATIONS:  None.  HISTORY OF PRESENT ILLNESS:  Details of the present illness recorded in the admission note.  The patient was admitted on the morning of surgery.  She was brought to the operating room.  She received a 2 g bolus of Ancef preoperatively.  She was placed in  serial compression devices to her lower extremities.  She was placed under adequate general endotracheal anesthesia and placed in the dorsal recumbent position with elevation of the knees.  The abdomen was prepped in the usual fashion.  Sterile drapes  were applied.  A lower abdominal transverse incision was made excising the old scars from 4 previous cesarean deliveries in the process of making the incision.  A segment of skin was excised.  The dissection was carried sharply down to fascia.  Fascia  was entered sharply and extended to the extent of the skin incision.  Rectus sheath was developed superiorly and inferiorly with blunt and sharp dissection.  Peritoneum was entered in the process of separating the rectus muscles and was extended under  direct visualization to allow the procedure to proceed.  The uterus was palpated.  It was approximately 12-[redacted] weeks gestational size and irregularly nodular consistent with recurrent uterine leiomyomata.  The tubes and ovaries were essentially normal  except for a small right ovary on  the right side.  A self-retaining O'Connor-O'Sullivan retractor was then used.  Moist packs were used to pack the intestinal contents out of the pelvis.  The uterus was grasped with a thyroid tenaculum and elevated into  the incision and into the abdominal wall.  The tripolar device was then used to progressively seal and divide the mesosalpinx on each side and the upper broad ligament on each side.  The proximal broad ligaments were clamped with Kellys to control  backbleeding.  The dissection was carried further down to the level of the uterine arteries.  At this point, the bladder flap was released and the bladder advanced bluntly and sharply off the lower segment.  This allowed control of the uterine artery  pedicles.  For exposure, the uterus was then excised from the cervix.  The bladder was further advanced off the cervix.  Progressive pedicles were developed using the Trio device to seal and divide the infundibulopelvic ligaments, and the paracervical  pedicles were developed to free the cervix to the level of the uterosacrals.  The uterosacrals were clamped with a Heaney clamp, divided sharply, entering the vagina on each side, and these pedicles were transfixed with a suture ligature in a Heaney  fashion.  The cervix was then excised from the vaginal cuff.  The cuff was closed vertically with figure-of-eights of 0 Monocryl.  Small bleeding sources were controlled with the Bovie.  This essentially transected the uterosacrals in the midline for  vaginal cuff support.  Irrigation was carried out.  Small bleeding source was controlled with the Bovie or with the LigaSure device.  The peritoneum was closed over the cuff and attached to the uterosacrals  posteriorly.  After achieving complete  hemostasis and completing the dissection, irrigation was carried out.  All pack, needle and instruments were removed.  The abdominal incision was closed in layers.  Running 2-0 Monocryl was used to close the  peritoneum and reapproximate the rectus  muscles.  Fascia was closed with a double PDS running from angle to angle on each side.  Subcutaneous tissue was approximated with a running 2-0 plain suture.  Please note, prior to closing the subcu, Exparel was injected into the fascia for  postoperative analgesia.  It was also injected into the subcutaneous tissue for wound pain management.  A total of approximately 20 mL of Exparel was used diluted with equal parts of saline.  Skin was closed with wide skin staples.  A sterile dressing  was applied.  The patient was awakened and taken to the recovery room in good condition.     LN/NUANCE  D:06/16/2017 T:06/17/2017 JOB:[id number]

## 2017-07-07 NOTE — Discharge Summary (Signed)
NAME: Amber Stephens, Amber Stephens MEDICAL RECORD [id number] ACCOUNT [id number] DATE OF BIRTH:02-07-1975 [facility] LOCATION: [facility] PHYSICIAN:Tecia Cinnamon. Evette Cristal, MD  DISCHARGE SUMMARY  DATE OF DISCHARGE:  06/16/2017  DISCHARGE DIAGNOSES:  Uterine leiomyoma, history of heavy menses, severe dysmenorrhea, 3 previous cesarean deliveries.  POSTOPERATIVE DIAGNOSES:  Uterine leiomyoma, history of heavy menses, severe dysmenorrhea, 3 previous cesarean deliveries, uterine leiomyomata and pelvic adhesions.  PROCEDURE:  Total abdominal hysterectomy, bilateral salpingectomy.  ESTIMATED INTRAOPERATIVE BLOOD LOSS:  200 mL.  INTRAOPERATIVE COMPLICATIONS:  None.  The patient was admitted on the morning of surgery.  She was brought to the operating room and placed under general anesthesia.  Placed in dorsal recumbent position.  The above procedure was accomplished without difficulty.  Her postoperative course was  entirely unremarkable.  She remained afebrile throughout her postoperative stay.  She was ambulating without difficulty on the morning following surgery and she was voiding without difficulty.  It was elected to discharge her home.  She was given routine  postoperative instructions.  She was given Vicodin to be taken as needed for postop pain.  She is to return to the office in 3 days for postoperative staple removal.  TN/NUANCE D:07/07/2017 T:07/07/2017 JOB:[id number]

## 2017-11-24 DIAGNOSIS — Z23 Encounter for immunization: Secondary | ICD-10-CM | POA: Diagnosis not present

## 2017-12-21 DIAGNOSIS — Z01419 Encounter for gynecological examination (general) (routine) without abnormal findings: Secondary | ICD-10-CM | POA: Diagnosis not present

## 2017-12-21 DIAGNOSIS — Z6833 Body mass index (BMI) 33.0-33.9, adult: Secondary | ICD-10-CM | POA: Diagnosis not present

## 2017-12-21 DIAGNOSIS — Z1231 Encounter for screening mammogram for malignant neoplasm of breast: Secondary | ICD-10-CM | POA: Diagnosis not present

## 2017-12-26 ENCOUNTER — Other Ambulatory Visit: Payer: Self-pay | Admitting: Obstetrics & Gynecology

## 2017-12-26 DIAGNOSIS — R928 Other abnormal and inconclusive findings on diagnostic imaging of breast: Secondary | ICD-10-CM

## 2017-12-28 ENCOUNTER — Ambulatory Visit
Admission: RE | Admit: 2017-12-28 | Discharge: 2017-12-28 | Disposition: A | Discharge status: Home / Self Care | Payer: BLUE CROSS/BLUE SHIELD | Source: Ambulatory Visit | Attending: Obstetrics & Gynecology | Admitting: Obstetrics & Gynecology

## 2017-12-28 DIAGNOSIS — R928 Other abnormal and inconclusive findings on diagnostic imaging of breast: Secondary | ICD-10-CM

## 2017-12-28 DIAGNOSIS — N6002 Solitary cyst of left breast: Secondary | ICD-10-CM | POA: Diagnosis not present

## 2018-01-18 ENCOUNTER — Ambulatory Visit: Payer: BLUE CROSS/BLUE SHIELD | Admitting: Pulmonary Disease

## 2018-01-18 ENCOUNTER — Encounter: Payer: Self-pay | Admitting: Pulmonary Disease

## 2018-01-18 VITALS — BP 126/70 | HR 62 | Temp 98.5°F | Ht 62.0 in | Wt 190.8 lb

## 2018-01-18 DIAGNOSIS — J301 Allergic rhinitis due to pollen: Secondary | ICD-10-CM

## 2018-01-18 DIAGNOSIS — Z0000 Encounter for general adult medical examination without abnormal findings: Secondary | ICD-10-CM

## 2018-01-18 DIAGNOSIS — J4520 Mild intermittent asthma, uncomplicated: Secondary | ICD-10-CM

## 2018-01-18 LAB — COMPREHENSIVE METABOLIC PANEL
ALK PHOS: 46 U/L (ref 39–117)
ALT: 12 U/L (ref 0–35)
AST: 12 U/L (ref 0–37)
Albumin: 3.9 g/dL (ref 3.5–5.2)
BILIRUBIN TOTAL: 0.5 mg/dL (ref 0.2–1.2)
BUN: 12 mg/dL (ref 6–23)
CHLORIDE: 104 meq/L (ref 96–112)
CO2: 30 meq/L (ref 19–32)
Calcium: 9.1 mg/dL (ref 8.4–10.5)
Creatinine, Ser: 0.85 mg/dL (ref 0.40–1.20)
GFR: 93.68 mL/min (ref >60.00)
GLUCOSE: 89 mg/dL (ref 70–99)
POTASSIUM: 3.9 meq/L (ref 3.5–5.1)
Sodium: 139 mEq/L (ref 135–145)
Total Protein: 7 g/dL (ref 6.0–8.3)

## 2018-01-18 LAB — LIPID PANEL
Cholesterol: 146 mg/dL (ref 0–200)
HDL: 52.7 mg/dL (ref >39.00)
LDL Cholesterol: 77 mg/dL (ref 0–99)
NONHDL: 93.63
Total CHOL/HDL Ratio: 3
Triglycerides: 83 mg/dL (ref 0.0–149.0)
VLDL: 16.6 mg/dL (ref 0.0–40.0)

## 2018-01-18 LAB — CBC WITH DIFFERENTIAL/PLATELET
BASOS ABS: 0.1 10*3/uL (ref 0.0–0.1)
Basophils Relative: 0.8 % (ref 0.0–3.0)
Eosinophils Absolute: 0.2 10*3/uL (ref 0.0–0.7)
Eosinophils Relative: 2.2 % (ref 0.0–5.0)
HCT: 36.8 % (ref 36.0–46.0)
Hemoglobin: 12.3 g/dL (ref 12.0–15.0)
LYMPHS PCT: 28.3 % (ref 12.0–46.0)
Lymphs Abs: 2.1 10*3/uL (ref 0.7–4.0)
MCHC: 33.4 g/dL (ref 30.0–36.0)
MCV: 87.1 fl (ref 78.0–100.0)
MONOS PCT: 9.4 % (ref 3.0–12.0)
Monocytes Absolute: 0.7 10*3/uL (ref 0.1–1.0)
NEUTROS ABS: 4.5 10*3/uL (ref 1.4–7.7)
Neutrophils Relative %: 59.3 % (ref 43.0–77.0)
PLATELETS: 341 10*3/uL (ref 150.0–400.0)
RBC: 4.22 Mil/uL (ref 3.87–5.11)
RDW: 13.8 % (ref 11.5–15.5)
WBC: 7.5 10*3/uL (ref 4.0–10.5)

## 2018-01-18 LAB — TSH: TSH: 0.68 u[IU]/mL (ref 0.35–4.50)

## 2018-01-18 MED ORDER — ALBUTEROL SULFATE HFA 108 (90 BASE) MCG/ACT IN AERS: INHALATION_SPRAY | RESPIRATORY_TRACT | 6 refills | Status: DC

## 2018-01-18 NOTE — Patient Instructions (Signed)
Today we updated your med list in our EPIC system...    Continue your current medications the same...  Today we did your follow up FASTING blood work...    We will contact you w/ the results when available...   [name], it has been my honor to have been one of your doctors over these many years...    Wishing you & your family a very Merry Christmas & a happy/ healthy new year!!    Please send my regards to your Dad & sister too.Marland KitchenMarland Kitchen

## 2018-01-23 ENCOUNTER — Telehealth: Payer: Self-pay | Admitting: Pulmonary Disease

## 2018-01-23 NOTE — Telephone Encounter (Signed)
Notes recorded by Noralee Space, MD on 01/21/2018 at 12:08 PM EST Please notify patient> Labs look good... FLP- all parameters at goals on diet alone;  Chems, CBC, Thyroid are all WNL... keep up the great work!  Spoke with patient. She is aware of results and verbalized understanding. Nothing further needed at time of call.

## 2018-04-22 DIAGNOSIS — H5213 Myopia, bilateral: Secondary | ICD-10-CM | POA: Diagnosis not present

## 2019-01-01 DIAGNOSIS — Z6832 Body mass index (BMI) 32.0-32.9, adult: Secondary | ICD-10-CM | POA: Diagnosis not present

## 2019-01-01 DIAGNOSIS — Z1231 Encounter for screening mammogram for malignant neoplasm of breast: Secondary | ICD-10-CM | POA: Diagnosis not present

## 2019-01-01 DIAGNOSIS — Z01419 Encounter for gynecological examination (general) (routine) without abnormal findings: Secondary | ICD-10-CM | POA: Diagnosis not present

## 2019-01-01 IMAGING — MG DIGITAL DIAGNOSTIC UNILATERAL LEFT MAMMOGRAM WITH TOMO AND CAD
4 series · 4 of 12 positions shown · non-contrast
Comparison: Previous exam(s).

CLINICAL DATA: Screening recall for a possible mass and a possible
asymmetry in the left breast.

EXAM:
DIGITAL DIAGNOSTIC LEFT MAMMOGRAM WITH CAD AND TOMO
ULTRASOUND LEFT BREAST

[L CC synth-2D]
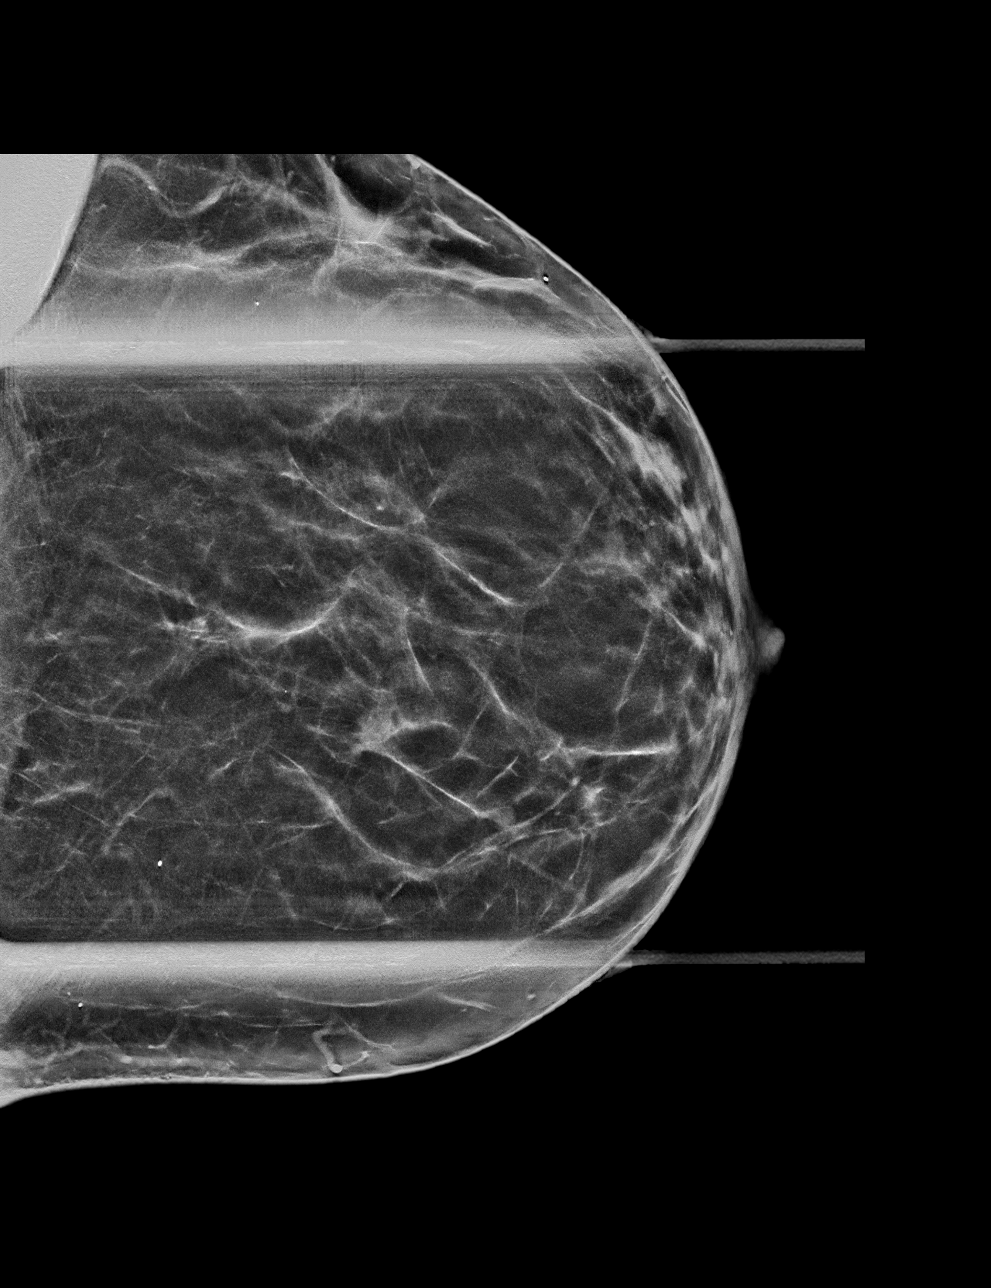

[L MLO synth-2D]
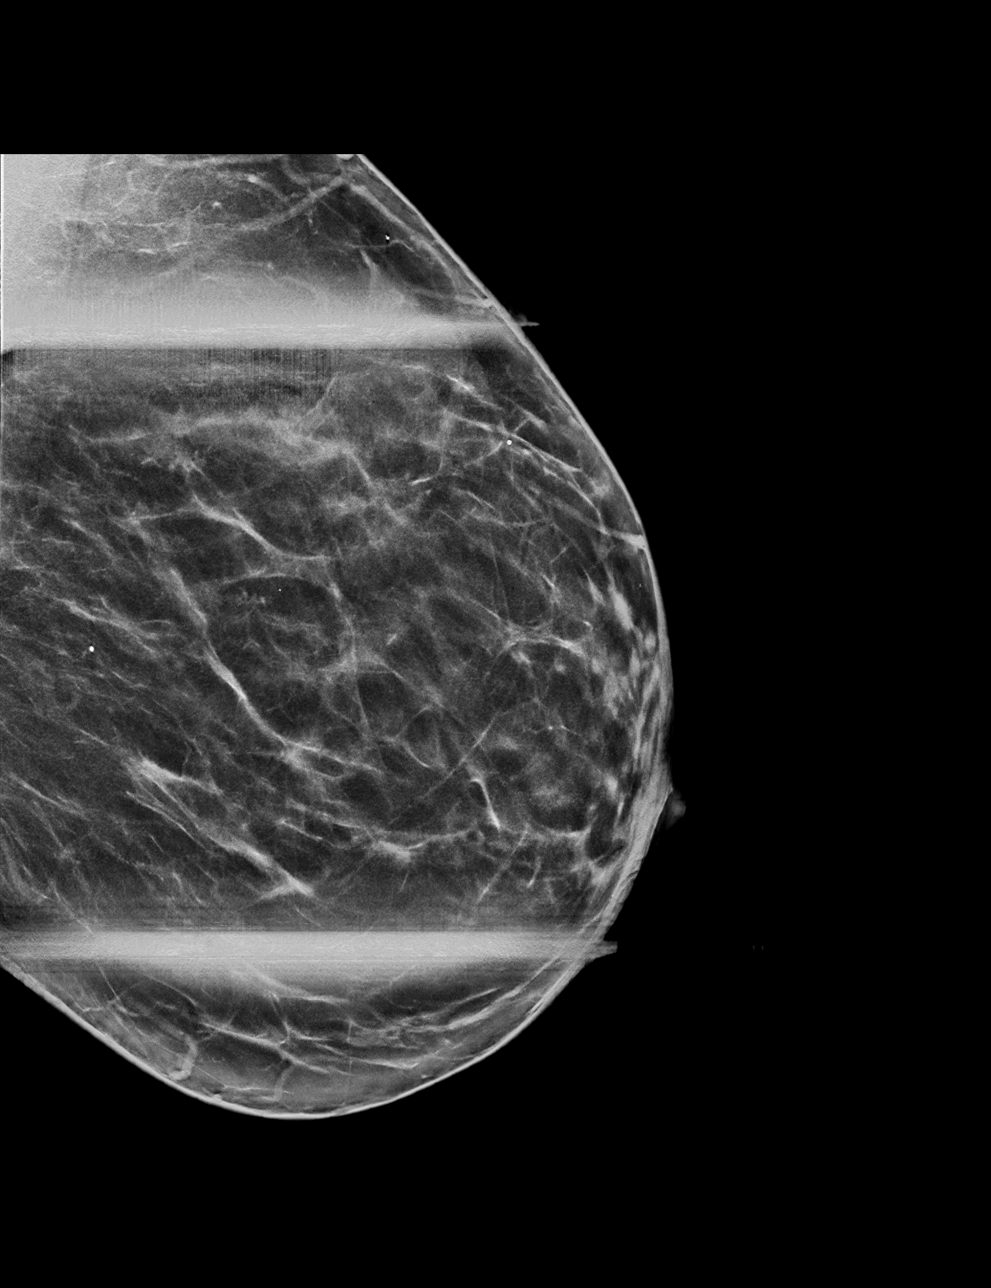

[L MLO tomo · tomo slice 31/62.0]
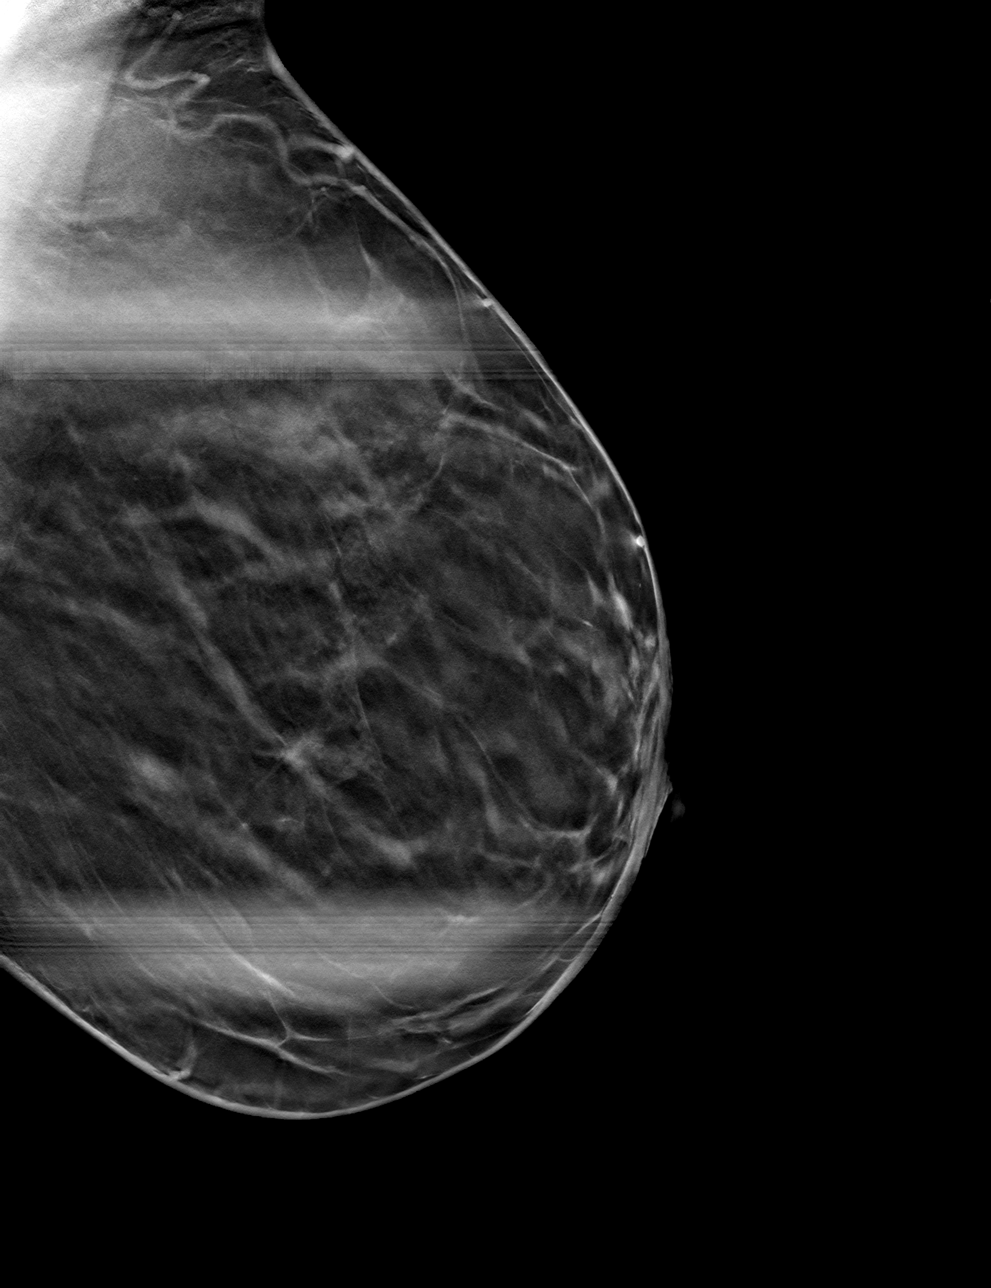

[L CC tomo · tomo slice 29/57.0]
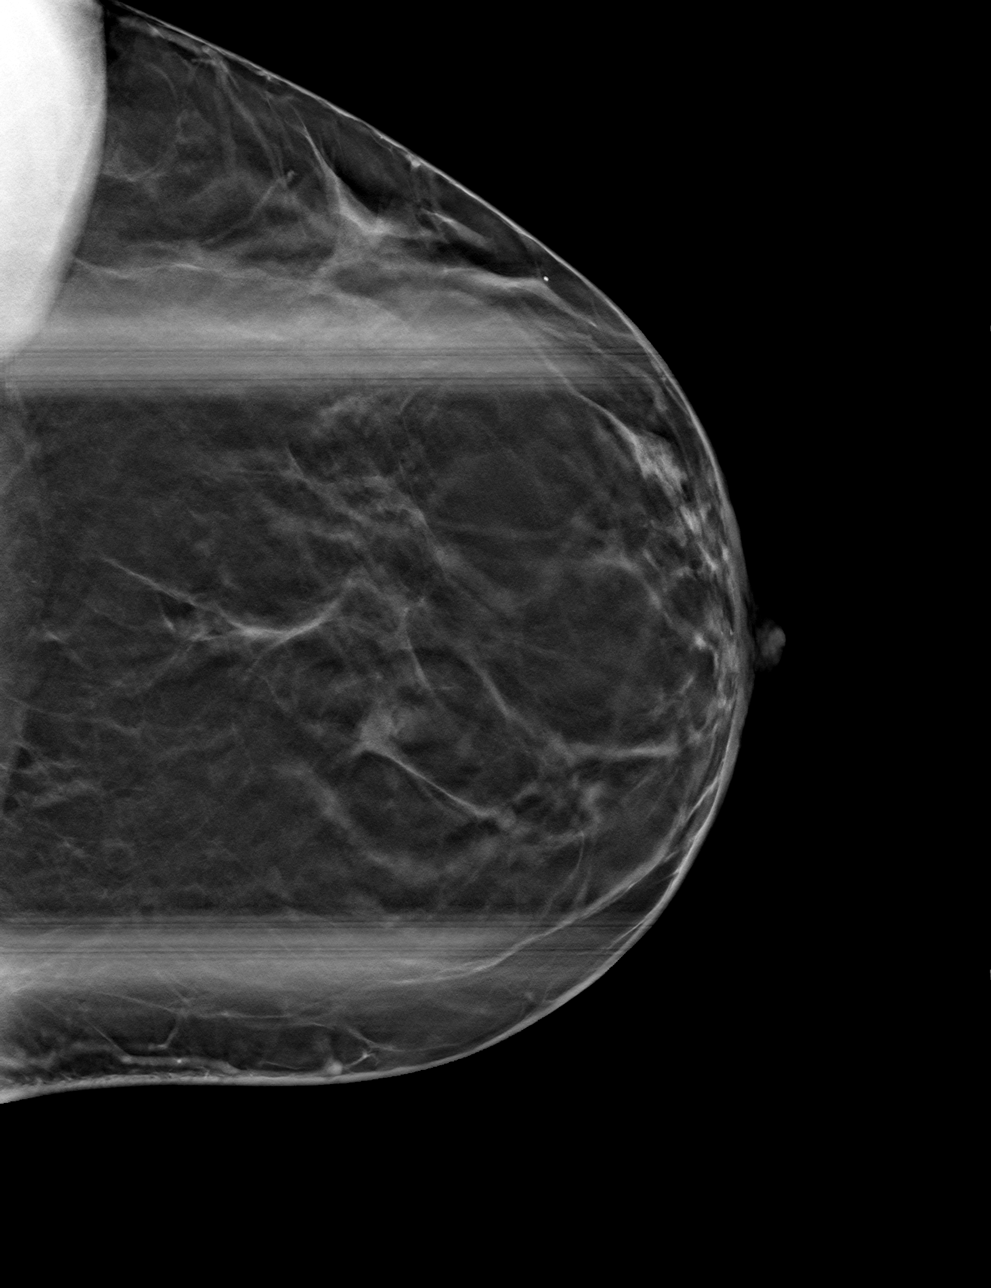

[4 of 12 positions shown; findings below may reference images not displayed]

ACR Breast Density Category b: There are scattered areas of
fibroglandular density.
FINDINGS: On the diagnostic spot-compression images, the possible asymmetry
noted in the upper slightly inner aspect of the left breast
disperses consistent with superimposed fibroglandular tissue. The
possible mass noted more anteriorly in the upper inner quadrant
persists. It appears as a small oval mostly circumscribed relatively
lucent mass.

Mammographic images were processed with CAD.

On physical exam, no mass is palpated in the upper inner left
breast.

Targeted ultrasound is performed, showing a small cyst in the left
breast at 10 o'clock, 1 cm from the nipple measuring 4 x 3 x 4 mm,
consistent in size, shape and location to the mammographic finding.
There are no other abnormalities. No abnormality is seen to
correspond to the area possible architectural distortion noted on
the screening mammogram.
IMPRESSION: 1. No evidence of breast malignancy.
2. 4 mm benign 10 o'clock position left breast cyst.

RECOMMENDATION:
Screening mammogram in one year.(Code:[id number])

I have discussed the findings and recommendations with the patient.
Results were also provided in writing at the conclusion of the
visit. If applicable, a reminder letter will be sent to the patient
regarding the next appointment.

BI-RADS CATEGORY  2: Benign.

## 2019-01-02 DIAGNOSIS — Z20828 Contact with and (suspected) exposure to other viral communicable diseases: Secondary | ICD-10-CM | POA: Diagnosis not present

## 2019-03-12 ENCOUNTER — Other Ambulatory Visit: Payer: Self-pay

## 2019-03-13 ENCOUNTER — Other Ambulatory Visit: Payer: Self-pay

## 2019-03-13 ENCOUNTER — Ambulatory Visit (INDEPENDENT_AMBULATORY_CARE_PROVIDER_SITE_OTHER): Payer: BC Managed Care – PPO | Admitting: Family

## 2019-03-13 ENCOUNTER — Encounter: Payer: Self-pay | Admitting: Family

## 2019-03-13 VITALS — BP 153/83 | HR 57 | Temp 97.8°F | Resp 16 | Ht 62.0 in | Wt 184.0 lb

## 2019-03-13 DIAGNOSIS — J4520 Mild intermittent asthma, uncomplicated: Secondary | ICD-10-CM

## 2019-03-13 DIAGNOSIS — Z862 Personal history of diseases of the blood and blood-forming organs and certain disorders involving the immune mechanism: Secondary | ICD-10-CM | POA: Diagnosis not present

## 2019-03-13 DIAGNOSIS — J301 Allergic rhinitis due to pollen: Secondary | ICD-10-CM | POA: Diagnosis not present

## 2019-03-13 NOTE — Progress Notes (Signed)
Subjective:    Patient ID: Amber Stephens, female    DOB: [birth year], 44 y.o.   MRN: [id number]  HPI   Patient is a 44 yr old female who presents today to establish care.  Pmhx is significant for the following:  Allergic Rhinitis- uses benadryl prn, tries to stay inside when pollen is high.   Asthma- she uses albuterol occasionally in the spring or if she is around pets.   Anemia- reports that this was prior to her hysterectomy Lab Results  Component Value Date   WBC 7.5 01/18/2018   HGB 12.3 01/18/2018   HCT 36.8 01/18/2018   MCV 87.1 01/18/2018   PLT 341.0 01/18/2018      Review of Systems  Constitutional: Negative for unexpected weight change.  HENT: Negative for hearing loss and rhinorrhea.   Eyes: Negative for visual disturbance.  Respiratory: Negative for cough and shortness of breath.   Cardiovascular: Negative for chest pain.  Gastrointestinal: Negative for blood in stool, constipation and diarrhea.  Genitourinary: Negative for dysuria, frequency and hematuria.  Musculoskeletal: Negative for arthralgias and myalgias.  Skin: Negative for rash.  Neurological: Negative for headaches.  Hematological: Negative for adenopathy.  Psychiatric/Behavioral:       Denies depression/anxiety   Past Medical History:  Diagnosis Date  . Allergic rhinitis   . Anemia    borderline   . Asthma   . History of pneumonia age 61 and agin   my mom said at age 49 and again at age 23      Social History   Socioeconomic History  . Marital status: Married    Spouse name: marcus  . Number of children: 4  . Years of education: Not on file  . Highest education level: Not on file  Occupational History  . 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 Smoking status: Never Smoker  . Smokeless tobacco: Never Used  Substance and Sexual Activity  . Alcohol use: No    Alcohol/week: 0.0 standard drinks  . Drug use: No  . Sexual activity: Yes   Partners: Male  Other Topics Concern  . Not on file  Social History Narrative   4 kids (G4P3)      2002- son, Adline Mango.   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 Difficulty of Paying Living Expenses: Not on file  Food Insecurity:   . Worried About Charity fundraiser in the Last Year: Not on file  . Ran Out of Food in the Last Year: Not on file  Transportation Needs:   . Lack of Transportation (Medical): Not on file  . Lack of Transportation (Non-Medical): Not on file  Physical Activity:   . Days of Exercise per Week: Not on file  . Minutes of Exercise per Session: Not on file  Stress:   . Feeling of Stress : Not on file  Social Connections:   . Frequency of Communication with Friends and Family: Not on file  . Frequency of Social Gatherings with Friends and Family: Not on file  . Attends Religious Services: Not on file  . Active Member of Clubs or Organizations: Not on file  .  Attends Archivist Meetings: Not on file  . Marital Status: Not on file  Intimate Partner Violence:   . Fear of Current or Ex-Partner: Not on file  . Emotionally Abused: Not on file  . Physically Abused: Not on file  . Sexually Abused: Not on file    Past Surgical History:  Procedure Laterality Date  . ABDOMINAL HYSTERECTOMY N/A 06/16/2017   Procedure: HYSTERECTOMY ABDOMINAL TOTAL;  Surgeon: Maisie Fus, MD;  Location: [facility];  Service: Gynecology;  Laterality: N/A;  . BILATERAL SALPINGECTOMY Bilateral 06/16/2017   Procedure: BILATERAL SALPINGECTOMY;  Surgeon: Maisie Fus, MD;  Location: [facility];  Service: Gynecology;  Laterality: Bilateral;  . BTL  11/2008   Dr Corinna Capra; bilateral tubal ligation   . CESAREAN  SECTION     x4 , with each child , has 4 children   . DILATION AND CURETTAGE OF UTERUS  07/02/2012  . MYOMECTOMY  2001   Dr [facility]ette Cristal at [facility]     Family History  Problem Relation Age of Onset  . Alzheimer's disease Mother        died at 89 (diagnosed at 56)  . Stroke Mother   . Hypertension Father   . Stroke Maternal Grandmother   . Diabetes Maternal Grandmother   . Diabetes Maternal Grandfather        (had bilateral amputation and blindness)  . Colon cancer Maternal Grandfather 67  . AAA (abdominal aortic aneurysm) Maternal Grandfather     No Known Allergies  Current Outpatient Medications on File Prior to Visit  Medication Sig Dispense Refill  . albuterol (PROAIR HFA) 108 (90 Base) MCG/ACT inhaler USE 1-2 PUFFS BY MOUTH EVERY 4 HOURS AS NEEDED wheezing and shortness of breath 3 Inhaler 6   No current facility-administered medications on file prior to visit.    BP (!) 153/83 (BP Location: Right Arm, Patient Position: Sitting, Cuff Size: Large)   Pulse (!) 57   Temp 97.8 F (36.6 C) (Temporal)   Resp 16   Ht 5\' 2"  (1.575 m)   Wt 184 lb (83.5 kg)   SpO2 100%   BMI 33.65 kg/m        Objective:   Physical Exam Constitutional:      Appearance: She is well-developed.  HENT:     Head: Normocephalic and atraumatic.     Right Ear: Tympanic membrane and ear canal normal.     Left Ear: Tympanic membrane and ear canal normal.  Neck:     Thyroid: No thyromegaly.  Cardiovascular:     Rate and Rhythm: Normal rate and regular rhythm.     Heart sounds: Normal heart sounds. No murmur.  Pulmonary:     Effort: Pulmonary effort is normal. No respiratory distress.     Breath sounds: Normal breath sounds. No wheezing.  Musculoskeletal:        General: No swelling.     Cervical back: Neck supple.  Skin:    General: Skin is warm and dry.  Neurological:     Mental Status: She is alert and oriented to person, place, and time.  Psychiatric:        Behavior:  Behavior normal.        Thought Content: Thought content normal.        Judgment: Judgment normal.           Assessment & Plan:  Elevated blood pressure reading-we discussed a low-sodium diet.  Patient is advised to  continue to work on healthy diet exercise and weight loss.  Plan to bring her back in about 2 weeks for blood pressure recheck.  If still elevated will need to consider addition of an antihypertensive.  Asthma-this is well controlled.  Only having occasional seasonal symptoms.  Continue albuterol as needed.  Allergic rhinitis-this too is seasonal and currently well controlled.  She uses Benadryl on a as needed basis.  History of anemia-last blood count was normal.  Was likely due to heavy bleeding prior to her hysterectomy.  Plan to check CBC at her upcoming physical.  45 minutes were spent on today's visit.  This visit occurred during the SARS-CoV-2 public health emergency.  Safety protocols were in place, including screening questions prior to the visit, additional usage of staff PPE, and extensive cleaning of exam room while observing appropriate contact time as indicated for disinfecting solutions.

## 2019-03-13 NOTE — Patient Instructions (Signed)
Welcome to [facility]! Work on low sodium diet.

## 2019-04-03 ENCOUNTER — Other Ambulatory Visit: Payer: Self-pay

## 2019-04-03 ENCOUNTER — Encounter: Payer: Self-pay | Admitting: Family

## 2019-04-03 ENCOUNTER — Ambulatory Visit (INDEPENDENT_AMBULATORY_CARE_PROVIDER_SITE_OTHER): Payer: BC Managed Care – PPO | Admitting: Family

## 2019-04-03 VITALS — BP 145/85 | HR 57 | Temp 97.2°F | Resp 16 | Ht 62.0 in | Wt 180.0 lb

## 2019-04-03 DIAGNOSIS — Z0000 Encounter for general adult medical examination without abnormal findings: Secondary | ICD-10-CM | POA: Diagnosis not present

## 2019-04-03 LAB — HEPATIC FUNCTION PANEL
ALT: 12 U/L (ref 0–35)
AST: 14 U/L (ref 0–37)
Albumin: 4.2 g/dL (ref 3.5–5.2)
Alkaline Phosphatase: 53 U/L (ref 39–117)
Bilirubin, Direct: 0.1 mg/dL (ref 0.0–0.3)
Total Bilirubin: 0.6 mg/dL (ref 0.2–1.2)
Total Protein: 7.5 g/dL (ref 6.0–8.3)

## 2019-04-03 LAB — CBC WITH DIFFERENTIAL/PLATELET
Basophils Absolute: 0 10*3/uL (ref 0.0–0.1)
Basophils Relative: 0.9 % (ref 0.0–3.0)
Eosinophils Absolute: 0.2 10*3/uL (ref 0.0–0.7)
Eosinophils Relative: 3.2 % (ref 0.0–5.0)
HCT: 39 % (ref 36.0–46.0)
Hemoglobin: 13.1 g/dL (ref 12.0–15.0)
Lymphocytes Relative: 38.5 % (ref 12.0–46.0)
Lymphs Abs: 2 10*3/uL (ref 0.7–4.0)
MCHC: 33.6 g/dL (ref 30.0–36.0)
MCV: 89.6 fl (ref 78.0–100.0)
Monocytes Absolute: 0.5 10*3/uL (ref 0.1–1.0)
Monocytes Relative: 9.1 % (ref 3.0–12.0)
Neutro Abs: 2.5 10*3/uL (ref 1.4–7.7)
Neutrophils Relative %: 48.3 % (ref 43.0–77.0)
Platelets: 332 10*3/uL (ref 150.0–400.0)
RBC: 4.36 Mil/uL (ref 3.87–5.11)
RDW: 12.8 % (ref 11.5–15.5)
WBC: 5.1 10*3/uL (ref 4.0–10.5)

## 2019-04-03 LAB — LIPID PANEL
Cholesterol: 153 mg/dL (ref 0–200)
HDL: 44.6 mg/dL (ref >39.00)
LDL Cholesterol: 98 mg/dL (ref 0–99)
NonHDL: 108.82
Total CHOL/HDL Ratio: 3
Triglycerides: 54 mg/dL (ref 0.0–149.0)
VLDL: 10.8 mg/dL (ref 0.0–40.0)

## 2019-04-03 LAB — BASIC METABOLIC PANEL
BUN: 16 mg/dL (ref 6–23)
CO2: 29 mEq/L (ref 19–32)
Calcium: 9.9 mg/dL (ref 8.4–10.5)
Chloride: 102 mEq/L (ref 96–112)
Creatinine, Ser: 1.04 mg/dL (ref 0.40–1.20)
GFR: 69.45 mL/min (ref >60.00)
Glucose, Bld: 87 mg/dL (ref 70–99)
Potassium: 4.3 mEq/L (ref 3.5–5.1)
Sodium: 138 mEq/L (ref 135–145)

## 2019-04-03 LAB — TSH: TSH: 0.6 u[IU]/mL (ref 0.35–4.50)

## 2019-04-03 NOTE — Progress Notes (Signed)
Subjective:    Patient ID: Amber Stephens, female    DOB: [birth year], 44 y.o.   MRN: [id number]  HPI  Patient presents today for complete physical.  Immunizations: had a flu shot.  Diet: working on improving diet Exercise: walking more at lunch Pap Smear:  N/A- s/p hysterectomy Mammogram: 2019 Vision:  2/20 Dental:  Every 6 months  HTN-  BP Readings from Last 3 Encounters:  04/03/19 (!) 145/85  03/13/19 (!) 153/83  01/18/18 126/70       Review of Systems  Constitutional: Negative for unexpected weight change.  HENT: Negative for hearing loss and rhinorrhea.   Eyes: Negative for visual disturbance.  Respiratory: Negative for cough and shortness of breath.   Cardiovascular: Negative for chest pain and leg swelling.  Gastrointestinal: Negative for blood in stool, constipation and diarrhea.  Genitourinary: Negative for dysuria, frequency and hematuria.  Musculoskeletal: Negative for arthralgias (some right shoulder pain with use, reports full ROM, uses a heating pad at night.  ) and myalgias.  Skin: Negative for rash.  Neurological: Negative for headaches.  Hematological: Negative for adenopathy.  Psychiatric/Behavioral:       Denies depression/anxiety   Past Medical History:  Diagnosis Date  . Allergic rhinitis   . Anemia    borderline   . Asthma   . History of pneumonia age 1 and agin   my mom said at age 86 and again at age 25      Social History   Socioeconomic History  . Marital status: Married    Spouse name: marcus  . Number of children: 4  . Years of education: Not on file  . Highest education level: Not on file  Occupational History  . 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 Smoking status: Never Smoker  . Smokeless tobacco: Never Used  Substance and Sexual Activity  . Alcohol use: No    Alcohol/week: 0.0 standard drinks  . Drug use: No  . Sexual activity: Yes    Partners: Male  Other Topics Concern    . Not on file  Social History Narrative   4 kids (G4P3)      2002- son, Adline Mango.   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 Difficulty of Paying Living Expenses: Not on file  Food Insecurity:   . Worried About Charity fundraiser in the Last Year: Not on file  . Ran Out of Food in the Last Year: Not on file  Transportation Needs:   . Lack of Transportation (Medical): Not on file  . Lack of Transportation (Non-Medical): Not on file  Physical Activity:   . Days of Exercise per Week: Not on file  . Minutes of Exercise per Session: Not on file  Stress:   . Feeling of Stress : Not on file  Social Connections:   . Frequency of Communication with Friends and Family: Not on file  . Frequency of Social Gatherings with Friends and Family: Not on file  . Attends Religious Services: Not on file  . Active Member of Clubs or Organizations: Not on file  . Attends Archivist Meetings: Not on file  .  Marital Status: Not on file  Intimate Partner Violence:   . Fear of Current or Ex-Partner: Not on file  . Emotionally Abused: Not on file  . Physically Abused: Not on file  . Sexually Abused: Not on file    Past Surgical History:  Procedure Laterality Date  . ABDOMINAL HYSTERECTOMY N/A 06/16/2017   Procedure: HYSTERECTOMY ABDOMINAL TOTAL;  Surgeon: Maisie Fus, MD;  Location: [facility];  Service: Gynecology;  Laterality: N/A;  . BILATERAL SALPINGECTOMY Bilateral 06/16/2017   Procedure: BILATERAL SALPINGECTOMY;  Surgeon: Maisie Fus, MD;  Location: [facility];  Service: Gynecology;  Laterality: Bilateral;  . BTL  11/2008   Dr Corinna Capra; bilateral tubal ligation   . CESAREAN SECTION     x4 , with each child ,  has 4 children   . DILATION AND CURETTAGE OF UTERUS  07/02/2012  . MYOMECTOMY  2001   Dr Evette Cristal at [facility]     Family History  Problem Relation Age of Onset  . Alzheimer's disease Mother        died at 58 (diagnosed at 17)  . Stroke Mother   . Hypertension Father   . Stroke Maternal Grandmother   . Diabetes Maternal Grandmother   . Diabetes Maternal Grandfather        (had bilateral amputation and blindness)  . Colon cancer Maternal Grandfather 20  . AAA (abdominal aortic aneurysm) Maternal Grandfather     No Known Allergies  Current Outpatient Medications on File Prior to Visit  Medication Sig Dispense Refill  . albuterol (PROAIR HFA) 108 (90 Base) MCG/ACT inhaler USE 1-2 PUFFS BY MOUTH EVERY 4 HOURS AS NEEDED wheezing and shortness of breath 3 Inhaler 6   No current facility-administered medications on file prior to visit.    BP (!) 145/85 (BP Location: Right Arm, Patient Position: Sitting, Cuff Size: Large)   Pulse (!) 57   Temp (!) 97.2 F (36.2 C) (Temporal)   Resp 16   Ht 5\' 2"  (1.575 m)   Wt 180 lb (81.6 kg)   SpO2 100%   BMI 32.92 kg/m       Objective:   Physical Exam Physical Exam  Constitutional: She is oriented to person, place, and time. She appears well-developed and well-nourished. No distress.  HENT:  Head: Normocephalic and atraumatic.  Right Ear: Tympanic membrane and ear canal normal.  Left Ear: Tympanic membrane and ear canal normal.  Mouth/Throat: Not examined- pt wearing mask Eyes: Pupils are equal, round, and reactive to light. No scleral icterus.  Neck: Normal range of motion. No thyromegaly present.  Cardiovascular: Normal rate and regular rhythm.   No murmur heard. Pulmonary/Chest: Effort normal and breath sounds normal. No respiratory distress. He has no wheezes. She has no rales. She exhibits no tenderness.  Abdominal: Soft. Bowel sounds are normal. She exhibits no distension and no mass. There is no tenderness. There is no  rebound and no guarding.  Musculoskeletal: She exhibits no edema.  Lymphadenopathy:    She has no cervical adenopathy.  Neurological: She is alert and oriented to person, place, and time. She has normal patellar reflexes. She exhibits normal muscle tone. Coordination normal.  Skin: Skin is warm and dry.  Psychiatric: She has a normal mood and affect. Her behavior is normal. Judgment and thought content normal.  Breast/pelvic: deferred       Assessment & Plan:   Preventative care- discussed healthy low sodium diet, exercise, weight loss. Mammogram up to  date at GYN office- will request copy of report. Obtain routine lab work. Immunizations reviewed and up to date.   Elevated blood pressure reading- SBP is again above goal. Suggested addition of a low dose antihypertensive. She wishes to work hard on diet/exercise and weight loss and recheck in 2 months.  If not improved at that time she states she will be agreeable to medication.       Assessment & Plan:  This visit occurred during the SARS-CoV-2 public health emergency.  Safety protocols were in place, including screening questions prior to the visit, additional usage of staff PPE, and extensive cleaning of exam room while observing appropriate contact time as indicated for disinfecting solutions.

## 2019-04-03 NOTE — Patient Instructions (Signed)
Please continue to work on healthy low sodium diet, exercise and weight loss.    Preventive Care 40-45 Years Old, Female Preventive care refers to visits with your health care provider and lifestyle choices that can promote health and wellness. This includes:  A yearly physical exam. This may also be called an annual well check.  Regular dental visits and eye exams.  Immunizations.  Screening for certain conditions.  Healthy lifestyle choices, such as eating a healthy diet, getting regular exercise, not using drugs or products that contain nicotine and tobacco, and limiting alcohol use. What can I expect for my preventive care visit? Physical exam Your health care provider will check your:  Height and weight. This may be used to calculate body mass index (BMI), which tells if you are at a healthy weight.  Heart rate and blood pressure.  Skin for abnormal spots. Counseling Your health care provider may ask you questions about your:  Alcohol, tobacco, and drug use.  Emotional well-being.  Home and relationship well-being.  Sexual activity.  Eating habits.  Work and work Statistician.  Method of birth control.  Menstrual cycle.  Pregnancy history. What immunizations do I need?  Influenza (flu) vaccine  This is recommended every year. Tetanus, diphtheria, and pertussis (Tdap) vaccine  You may need a Td booster every 10 years. Varicella (chickenpox) vaccine  You may need this if you have not been vaccinated. Zoster (shingles) vaccine  You may need this after age 37. Measles, mumps, and rubella (MMR) vaccine  You may need at least one dose of MMR if you were born in 1957 or later. You may also need a second dose. Pneumococcal conjugate (PCV13) vaccine  You may need this if you have certain conditions and were not previously vaccinated. Pneumococcal polysaccharide (PPSV23) vaccine  You may need one or two doses if you smoke cigarettes or if you have certain  conditions. Meningococcal conjugate (MenACWY) vaccine  You may need this if you have certain conditions. Hepatitis A vaccine  You may need this if you have certain conditions or if you travel or work in places where you may be exposed to hepatitis A. Hepatitis B vaccine  You may need this if you have certain conditions or if you travel or work in places where you may be exposed to hepatitis B. Haemophilus influenzae type b (Hib) vaccine  You may need this if you have certain conditions. Human papillomavirus (HPV) vaccine  If recommended by your health care provider, you may need three doses over 6 months. You may receive vaccines as individual doses or as more than one vaccine together in one shot (combination vaccines). Talk with your health care provider about the risks and benefits of combination vaccines. What tests do I need? Blood tests  Lipid and cholesterol levels. These may be checked every 5 years, or more frequently if you are over 36 years old.  Hepatitis C test.  Hepatitis B test. Screening  Lung cancer screening. You may have this screening every year starting at age 40 if you have a 30-pack-year history of smoking and currently smoke or have quit within the past 15 years.  Colorectal cancer screening. All adults should have this screening starting at age 39 and continuing until age 90. Your health care provider may recommend screening at age 32 if you are at increased risk. You will have tests every 1-10 years, depending on your results and the type of screening test.  Diabetes screening. This is done by checking your  blood sugar (glucose) after you have not eaten for a while (fasting). You may have this done every 1-3 years.  Mammogram. This may be done every 1-2 years. Talk with your health care provider about when you should start having regular mammograms. This may depend on whether you have a family history of breast cancer.  BRCA-related cancer screening. This  may be done if you have a family history of breast, ovarian, tubal, or peritoneal cancers.  Pelvic exam and Pap test. This may be done every 3 years starting at age 4. Starting at age 37, this may be done every 5 years if you have a Pap test in combination with an HPV test. Other tests  Sexually transmitted disease (STD) testing.  Bone density scan. This is done to screen for osteoporosis. You may have this scan if you are at high risk for osteoporosis. Follow these instructions at home: Eating and drinking  Eat a diet that includes fresh fruits and vegetables, whole grains, lean protein, and low-fat dairy.  Take vitamin and mineral supplements as recommended by your health care provider.  Do not drink alcohol if: ? Your health care provider tells you not to drink. ? You are pregnant, may be pregnant, or are planning to become pregnant.  If you drink alcohol: ? Limit how much you have to 0-1 drink a day. ? Be aware of how much alcohol is in your drink. In the [place] one drink equals one 12 oz bottle of beer (355 mL), one 5 oz glass of wine (148 mL), or one 1 oz glass of hard liquor (44 mL). Lifestyle  Take daily care of your teeth and gums.  Stay active. Exercise for at least 30 minutes on 5 or more days each week.  Do not use any products that contain nicotine or tobacco, such as cigarettes, e-cigarettes, and chewing tobacco. If you need help quitting, ask your health care provider.  If you are sexually active, practice safe sex. Use a condom or other form of birth control (contraception) in order to prevent pregnancy and STIs (sexually transmitted infections).  If told by your health care provider, take low-dose aspirin daily starting at age 45. What's next?  Visit your health care provider once a year for a well check visit.  Ask your health care provider how often you should have your eyes and teeth checked.  Stay up to date on all vaccines. This information is not  intended to replace advice given to you by your health care provider. Make sure you discuss any questions you have with your health care provider. Document Revised: 09/29/2017 Document Reviewed: 09/29/2017 Elsevier Patient Education  2020 [employer].

## 2019-06-05 ENCOUNTER — Ambulatory Visit: Payer: BC Managed Care – PPO | Admitting: Family

## 2019-06-27 ENCOUNTER — Ambulatory Visit: Payer: BC Managed Care – PPO | Admitting: Family

## 2019-06-27 ENCOUNTER — Other Ambulatory Visit: Payer: Self-pay

## 2019-06-27 ENCOUNTER — Encounter: Payer: Self-pay | Admitting: Family

## 2019-06-27 VITALS — BP 161/93 | HR 68 | Temp 98.2°F | Resp 16 | Ht 62.0 in | Wt 172.0 lb

## 2019-06-27 DIAGNOSIS — I10 Essential (primary) hypertension: Secondary | ICD-10-CM

## 2019-06-27 DIAGNOSIS — J45909 Unspecified asthma, uncomplicated: Secondary | ICD-10-CM | POA: Diagnosis not present

## 2019-06-27 MED ORDER — AMLODIPINE BESYLATE 5 MG PO TABS
5.0000 mg | ORAL_TABLET | Freq: Every day | ORAL | 3 refills | Status: DC
Start: 2019-06-27 — End: 2019-09-22

## 2019-06-27 NOTE — Progress Notes (Signed)
Subjective:    Patient ID: Amber Stephens, female    DOB: Feb 28, 1975, 44 y.o.   MRN: [id number]  HPI  Patient is a 44 yr old female who presents today for follow up.  HTN- last visit blood pressure was noted to be elevated.  We recommended medication, but she declined at that time, wishing instead to work hard on diet/exercise and weight loss. She is going to the gym 4 times a week. She is watching her sodium content.  More fresh fruits/veggies.   BP Readings from Last 3 Encounters:  06/27/19 (!) 161/93  04/03/19 (!) 145/85  03/13/19 (!) 153/83   Wt Readings from Last 3 Encounters:  06/27/19 172 lb (78 kg)  04/03/19 180 lb (81.6 kg)  03/13/19 184 lb (83.5 kg)    Asthma- reports that the pollen has worsened her asthma.    Review of Systems Past Medical History:  Diagnosis Date  . Allergic rhinitis   . Anemia    borderline   . Asthma   . History of pneumonia age 72 and agin   my mom said at age 76 and again at age 30      Social History   Socioeconomic History  . Marital status: Married    Spouse name: marcus  . Number of children: 4  . Years of education: Not on file  . Highest education level: Not on file  Occupational History  . 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 Smoking status: Never Smoker  . Smokeless tobacco: Never Used  Substance and Sexual Activity  . Alcohol use: No    Alcohol/week: 0.0 standard drinks  . Drug use: No  . Sexual activity: Yes    Partners: Male  Other Topics Concern  . Not on file  Social History Narrative   4 kids (G4P3)      2002- son, Adline Mango.   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 Difficulty of Paying Living Expenses:   Food Insecurity:    . Worried About Charity fundraiser in the Last Year:   . Arboriculturist in the Last Year:   Transportation Needs:   . Film/video editor (Medical):   Marland Kitchen Lack of Transportation (Non-Medical):   Physical Activity:   . Days of Exercise per Week:   . Minutes of Exercise per Session:   Stress:   . Feeling of Stress :   Social Connections:   . Frequency of Communication with Friends and Family:   . Frequency of Social Gatherings with Friends and Family:   . Attends Religious Services:   . Active Member of Clubs or Organizations:   . Attends Archivist Meetings:   Marland Kitchen Marital Status:   Intimate Partner Violence:   . Fear of Current or Ex-Partner:   . Emotionally Abused:   Marland Kitchen Physically Abused:   . Sexually Abused:     Past Surgical History:  Procedure Laterality Date  . ABDOMINAL HYSTERECTOMY N/A 06/16/2017   Procedure: HYSTERECTOMY ABDOMINAL TOTAL;  Surgeon: Maisie Fus, MD;  Location: [facility];  Service: Gynecology;  Laterality: N/A;  . BILATERAL SALPINGECTOMY  Bilateral 06/16/2017   Procedure: BILATERAL SALPINGECTOMY;  Surgeon: Maisie Fus, MD;  Location: [facility];  Service: Gynecology;  Laterality: Bilateral;  . BTL  11/2008   Dr Corinna Capra; bilateral tubal ligation   . CESAREAN SECTION     x4 , with each child , has 4 children   . DILATION AND CURETTAGE OF UTERUS  07/02/2012  . MYOMECTOMY  2001   Dr [facility] at [facility]     Family History  Problem Relation Age of Onset  . Alzheimer's disease Mother        died at 61 (diagnosed at 59)  . Stroke Mother   . Hypertension Father   . Stroke Maternal Grandmother   . Diabetes Maternal Grandmother   . Diabetes Maternal Grandfather        (had bilateral amputation and blindness)  . Colon cancer Maternal Grandfather 75  . AAA (abdominal aortic aneurysm) Maternal Grandfather     No Known Allergies  Current Outpatient Medications on File Prior to Visit  Medication Sig  Dispense Refill  . albuterol (PROAIR HFA) 108 (90 Base) MCG/ACT inhaler USE 1-2 PUFFS BY MOUTH EVERY 4 HOURS AS NEEDED wheezing and shortness of breath 3 Inhaler 6   No current facility-administered medications on file prior to visit.    BP (!) 161/93 (BP Location: Right Arm, Patient Position: Sitting, Cuff Size: Normal)   Pulse 68   Temp 98.2 F (36.8 C) (Temporal)   Resp 16   Ht 5\' 2"  (1.575 m)   Wt 172 lb (78 kg)   SpO2 100%   BMI 31.46 kg/m       Objective:   Physical Exam Constitutional:      Appearance: She is well-developed.  Neck:     Thyroid: No thyromegaly.  Cardiovascular:     Rate and Rhythm: Normal rate and regular rhythm.     Heart sounds: Normal heart sounds. No murmur.  Pulmonary:     Effort: Pulmonary effort is normal. No respiratory distress.     Breath sounds: Normal breath sounds. No wheezing.  Musculoskeletal:     Cervical back: Neck supple.  Skin:    General: Skin is warm and dry.  Neurological:     Mental Status: She is alert and oriented to person, place, and time.  Psychiatric:        Behavior: Behavior normal.        Thought Content: Thought content normal.        Judgment: Judgment normal.           Assessment & Plan:  HTN- uncontrolled. Will add amlodipine 5mg  once daily. I did commend pt on her lifestyle modification and weight loss. Unfortunately, she has a family hx of HTN which is likely why we are not seeing more improvement in her blood pressure.   Asthma- fair control- continue albuterol.     This visit occurred during the SARS-CoV-2 public health emergency.  Safety protocols were in place, including screening questions prior to the visit, additional usage of staff PPE, and extensive cleaning of exam room while observing appropriate contact time as indicated for disinfecting solutions.

## 2019-07-04 DIAGNOSIS — H5213 Myopia, bilateral: Secondary | ICD-10-CM | POA: Diagnosis not present

## 2019-07-11 ENCOUNTER — Ambulatory Visit: Payer: BC Managed Care – PPO | Admitting: Family

## 2019-07-13 ENCOUNTER — Other Ambulatory Visit: Payer: Self-pay

## 2019-07-13 ENCOUNTER — Encounter: Payer: Self-pay | Admitting: Family

## 2019-07-13 ENCOUNTER — Ambulatory Visit: Payer: BC Managed Care – PPO | Admitting: Family

## 2019-07-13 VITALS — BP 122/86 | HR 71 | Temp 97.8°F | Resp 16 | Ht 62.0 in | Wt 173.0 lb

## 2019-07-13 DIAGNOSIS — I10 Essential (primary) hypertension: Secondary | ICD-10-CM | POA: Diagnosis not present

## 2019-07-13 NOTE — Patient Instructions (Signed)
Please continue current dose of amlodipine.  °

## 2019-07-13 NOTE — Progress Notes (Signed)
Subjective:    Patient ID: Amber Stephens, female    DOB: [birth year], 44 y.o.   MRN: [id number]  HPI  HTN- last visit we added amlodipine 5mg  once daily.  BP Readings from Last 3 Encounters:  07/13/19 122/86  06/27/19 (!) 161/93  04/03/19 (!) 145/85     Review of Systems See HPI  Past Medical History:  Diagnosis Date  . Allergic rhinitis   . Anemia    borderline   . Asthma   . History of pneumonia age 70 and agin   my mom said at age 14 and again at age 65      Social History   Socioeconomic History  . Marital status: Married    Spouse name: marcus  . Number of children: 4  . Years of education: Not on file  . Highest education level: Not on file  Occupational History  . 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 Smoking status: Never Smoker  . Smokeless tobacco: Never Used  Vaping Use  . Vaping Use: Never used  Substance and Sexual Activity  . Alcohol use: No    Alcohol/week: 0.0 standard drinks  . Drug use: No  . Sexual activity: Yes    Partners: Male  Other Topics Concern  . Not on file  Social History Narrative   4 kids (G4P3)      2002- son, Adline Mango.   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 Difficulty of Paying Living Expenses:   Food Insecurity:   . Worried About Charity fundraiser in the Last Year:   . Arboriculturist in the Last Year:   Transportation Needs:   . Film/video editor (Medical):   Marland Kitchen Lack of Transportation (Non-Medical):   Physical Activity:   . Days of Exercise per Week:   . Minutes of Exercise per Session:   Stress:   . Feeling of Stress :   Social Connections:   . Frequency of Communication with Friends and Family:   . Frequency of Social  Gatherings with Friends and Family:   . Attends Religious Services:   . Active Member of Clubs or Organizations:   . Attends Archivist Meetings:   Marland Kitchen Marital Status:   Intimate Partner Violence:   . Fear of Current or Ex-Partner:   . Emotionally Abused:   Marland Kitchen Physically Abused:   . Sexually Abused:     Past Surgical History:  Procedure Laterality Date  . ABDOMINAL HYSTERECTOMY N/A 06/16/2017   Procedure: HYSTERECTOMY ABDOMINAL TOTAL;  Surgeon: Maisie Fus, MD;  Location: [name];  Service: Gynecology;  Laterality: N/A;  . BILATERAL SALPINGECTOMY Bilateral 06/16/2017   Procedure: BILATERAL SALPINGECTOMY;  Surgeon: Maisie Fus, MD;  Location: [facility];  Service: Gynecology;  Laterality: Bilateral;  . BTL  11/2008   Dr Corinna Capra; bilateral tubal ligation   . CESAREAN SECTION     x4 , with each child , has 4 children   . DILATION AND CURETTAGE OF UTERUS  07/02/2012  . MYOMECTOMY  2001   Dr Evette Cristal at [facility]  Family History  Problem Relation Age of Onset  . Alzheimer's disease Mother        died at 35 (diagnosed at 45)  . Stroke Mother   . Hypertension Father   . Stroke Maternal Grandmother   . Diabetes Maternal Grandmother   . Diabetes Maternal Grandfather        (had bilateral amputation and blindness)  . Colon cancer Maternal Grandfather 64  . AAA (abdominal aortic aneurysm) Maternal Grandfather     No Known Allergies  Current Outpatient Medications on File Prior to Visit  Medication Sig Dispense Refill  . albuterol (PROAIR HFA) 108 (90 Base) MCG/ACT inhaler USE 1-2 PUFFS BY MOUTH EVERY 4 HOURS AS NEEDED wheezing and shortness of breath 3 Inhaler 6  . amLODipine (NORVASC) 5 MG tablet Take 1 tablet (5 mg total) by mouth daily. 30 tablet 3   No current facility-administered medications on file prior to visit.    BP 122/86 (BP Location: Right Arm, Patient Position: Sitting, Cuff Size: Large)   Pulse 71   Temp  97.8 F (36.6 C) (Temporal)   Resp 16   Ht 5\' 2"  (1.575 m)   Wt 173 lb (78.5 kg)   SpO2 100%   BMI 31.64 kg/m       Objective:   Physical Exam Constitutional:      Appearance: She is well-developed.  Cardiovascular:     Rate and Rhythm: Normal rate and regular rhythm.     Heart sounds: Normal heart sounds. No murmur heard.   Pulmonary:     Effort: Pulmonary effort is normal. No respiratory distress.     Breath sounds: Normal breath sounds. No wheezing.  Psychiatric:        Behavior: Behavior normal.        Thought Content: Thought content normal.        Judgment: Judgment normal.           Assessment & Plan:  HTN- bp now improved on amlodipine 5mg .  Pt is advised to continue amlodipine as well as her ongoing exercise and weight loss efforts.  This visit occurred during the SARS-CoV-2 public health emergency.  Safety protocols were in place, including screening questions prior to the visit, additional usage of staff PPE, and extensive cleaning of exam room while observing appropriate contact time as indicated for disinfecting solutions.

## 2019-09-22 ENCOUNTER — Other Ambulatory Visit: Payer: Self-pay | Admitting: Family

## 2019-10-15 ENCOUNTER — Ambulatory Visit: Payer: BC Managed Care – PPO | Admitting: Family

## 2019-10-31 ENCOUNTER — Other Ambulatory Visit: Payer: Self-pay

## 2019-10-31 ENCOUNTER — Encounter: Payer: Self-pay | Admitting: Family

## 2019-10-31 ENCOUNTER — Ambulatory Visit: Payer: BC Managed Care – PPO | Admitting: Family

## 2019-10-31 VITALS — BP 139/87 | HR 60 | Temp 98.4°F | Resp 16 | Ht 62.0 in | Wt 173.8 lb

## 2019-10-31 DIAGNOSIS — L304 Erythema intertrigo: Secondary | ICD-10-CM

## 2019-10-31 DIAGNOSIS — I10 Essential (primary) hypertension: Secondary | ICD-10-CM | POA: Diagnosis not present

## 2019-10-31 DIAGNOSIS — J4520 Mild intermittent asthma, uncomplicated: Secondary | ICD-10-CM

## 2019-10-31 MED ORDER — NYSTATIN 100000 UNIT/GM EX POWD: 1.0000 "application " | Freq: Two times a day (BID) | CUTANEOUS | 1 refills | Status: DC

## 2019-10-31 NOTE — Progress Notes (Signed)
Subjective:    Patient ID: Amber Stephens, female    DOB: [birth year], 44 y.o.   MRN: [id number]  HPI  Patient is a 44 yr old female who presents today for follow up of her blood pressure. Continues amlodipine 5 mg once daily.   Skin rash- notes intemittent rash- left axilla and beneath breasts  Will get flu shot from work.   BP Readings from Last 3 Encounters:  10/31/19 139/87  07/13/19 122/86  06/27/19 (!) 161/93   Asthma- Reports that this occurs in the spring and if outside a lot. She keeps albuterol on hand just in case.  Review of Systems See HPI  Past Medical History:  Diagnosis Date  . Allergic rhinitis   . Anemia    borderline   . Asthma   . History of pneumonia age 53 and agin   my mom said at age 46 and again at age 51      Social History   Socioeconomic History  . Marital status: Married    Spouse name: marcus  . Number of children: 4  . Years of education: Not on file  . Highest education level: Not on file  Occupational History  . 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 Smoking status: Never Smoker  . Smokeless tobacco: Never Used  Vaping Use  . Vaping Use: Never used  Substance and Sexual Activity  . Alcohol use: No    Alcohol/week: 0.0 standard drinks  . Drug use: No  . Sexual activity: Yes    Partners: Male  Other Topics Concern  . Not on file  Social History Narrative   4 kids (G4P3)      2002- son, Adline Mango.   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 Difficulty of Paying Living Expenses: Not on file  Food Insecurity:   . Worried About Charity fundraiser in the Last Year: Not on file  . Ran Out of Food in the Last Year: Not on file  Transportation  Needs:   . Lack of Transportation (Medical): Not on file  . Lack of Transportation (Non-Medical): Not on file  Physical Activity:   . Days of Exercise per Week: Not on file  . Minutes of Exercise per Session: Not on file  Stress:   . Feeling of Stress : Not on file  Social Connections:   . Frequency of Communication with Friends and Family: Not on file  . Frequency of Social Gatherings with Friends and Family: Not on file  . Attends Religious Services: Not on file  . Active Member of Clubs or Organizations: Not on file  . Attends Archivist Meetings: Not on file  . Marital Status: Not on file  Intimate Partner Violence:   . Fear of Current or Ex-Partner: Not on file  . Emotionally Abused: Not on file  . Physically Abused: Not on file  . Sexually Abused: Not on file    Past Surgical History:  Procedure Laterality Date  . ABDOMINAL HYSTERECTOMY N/A 06/16/2017   Procedure: HYSTERECTOMY ABDOMINAL TOTAL;  Surgeon: Maisie Fus, MD;  Location: [facility];  Service: Gynecology;  Laterality: N/A;  . BILATERAL SALPINGECTOMY Bilateral 06/16/2017   Procedure: BILATERAL SALPINGECTOMY;  Surgeon: Maisie Fus, MD;  Location: [facility];  Service: Gynecology;  Laterality: Bilateral;  . BTL  11/2008   Dr Corinna Capra; bilateral tubal ligation   . CESAREAN SECTION     x4 , with each child , has 4 children   . DILATION AND CURETTAGE OF UTERUS  07/02/2012  . MYOMECTOMY  2001   Dr Evette Cristal at [facility]     Family History  Problem Relation Age of Onset  . Alzheimer's disease Mother        died at 66 (diagnosed at 70)  . Stroke Mother   . Hypertension Father   . Stroke Maternal Grandmother   . Diabetes Maternal Grandmother   . Diabetes Maternal Grandfather        (had bilateral amputation and blindness)  . Colon cancer Maternal Grandfather 14  . AAA (abdominal aortic aneurysm) Maternal Grandfather     No Known Allergies  Current Outpatient  Medications on File Prior to Visit  Medication Sig Dispense Refill  . albuterol (PROAIR HFA) 108 (90 Base) MCG/ACT inhaler USE 1-2 PUFFS BY MOUTH EVERY 4 HOURS AS NEEDED wheezing and shortness of breath 3 Inhaler 6  . amLODipine (NORVASC) 5 MG tablet TAKE 1 TABLET BY MOUTH EVERY DAY 90 tablet 1   No current facility-administered medications on file prior to visit.    BP 139/87 (BP Location: Right Arm, Patient Position: Sitting, Cuff Size: Small)   Pulse 60   Temp 98.4 F (36.9 C) (Oral)   Resp 16   Ht 5\' 2"  (1.575 m)   Wt 173 lb 12.8 oz (78.8 kg)   SpO2 100%   BMI 31.79 kg/m       Objective:   Physical Exam Constitutional:      Appearance: She is well-developed.  Neck:     Thyroid: No thyromegaly.  Cardiovascular:     Rate and Rhythm: Normal rate and regular rhythm.     Heart sounds: Normal heart sounds. No murmur heard.   Pulmonary:     Effort: Pulmonary effort is normal. No respiratory distress.     Breath sounds: Normal breath sounds. No wheezing.  Musculoskeletal:     Cervical back: Neck supple.  Skin:    General: Skin is warm and dry.     Comments: Mildly hyperpigmented rash left axilla  Neurological:     Mental Status: She is alert and oriented to person, place, and time.  Psychiatric:        Behavior: Behavior normal.        Thought Content: Thought content normal.        Judgment: Judgment normal.           Assessment & Plan:  Intertrigo- new. Rx sent for nystatin prn.  HTN-   BP acceptable. Continue amlodipine 5mg  once daily.   Asthma- stable with prn use of albuterol.   This visit occurred during the SARS-CoV-2 public health emergency.  Safety protocols were in place, including screening questions prior to the visit, additional usage of staff PPE, and extensive cleaning of exam room while observing appropriate contact time as indicated for disinfecting solutions.

## 2019-10-31 NOTE — Patient Instructions (Signed)
You may use nystatin powder twice daily as needed for rash. Continue amlodipine 5mg  once daily and low sodium diet.

## 2020-01-15 DIAGNOSIS — Z20822 Contact with and (suspected) exposure to covid-19: Secondary | ICD-10-CM | POA: Diagnosis not present

## 2020-03-22 ENCOUNTER — Other Ambulatory Visit: Payer: Self-pay | Admitting: Family

## 2020-04-30 ENCOUNTER — Encounter: Payer: Self-pay | Admitting: Family

## 2020-05-28 ENCOUNTER — Encounter: Payer: Self-pay | Admitting: Family

## 2020-05-28 ENCOUNTER — Other Ambulatory Visit: Payer: Self-pay

## 2020-05-28 ENCOUNTER — Ambulatory Visit (INDEPENDENT_AMBULATORY_CARE_PROVIDER_SITE_OTHER): Payer: 59 | Admitting: Family

## 2020-05-28 VITALS — BP 126/89 | HR 61 | Temp 98.0°F | Resp 16 | Ht 62.0 in | Wt 185.0 lb

## 2020-05-28 DIAGNOSIS — R635 Abnormal weight gain: Secondary | ICD-10-CM

## 2020-05-28 DIAGNOSIS — E669 Obesity, unspecified: Secondary | ICD-10-CM

## 2020-05-28 DIAGNOSIS — Z0000 Encounter for general adult medical examination without abnormal findings: Secondary | ICD-10-CM | POA: Diagnosis not present

## 2020-05-28 DIAGNOSIS — I10 Essential (primary) hypertension: Secondary | ICD-10-CM

## 2020-05-28 DIAGNOSIS — Z833 Family history of diabetes mellitus: Secondary | ICD-10-CM

## 2020-05-28 HISTORY — DX: Essential (primary) hypertension: I10

## 2020-05-28 LAB — COMPREHENSIVE METABOLIC PANEL
ALT: 11 U/L (ref 0–35)
AST: 13 U/L (ref 0–37)
Albumin: 4.2 g/dL (ref 3.5–5.2)
Alkaline Phosphatase: 52 U/L (ref 39–117)
BUN: 12 mg/dL (ref 6–23)
CO2: 29 mEq/L (ref 19–32)
Calcium: 9.5 mg/dL (ref 8.4–10.5)
Chloride: 103 mEq/L (ref 96–112)
Creatinine, Ser: 0.93 mg/dL (ref 0.40–1.20)
GFR: 74.08 mL/min (ref >60.00)
Glucose, Bld: 89 mg/dL (ref 70–99)
Potassium: 4.3 mEq/L (ref 3.5–5.1)
Sodium: 138 mEq/L (ref 135–145)
Total Bilirubin: 0.3 mg/dL (ref 0.2–1.2)
Total Protein: 7 g/dL (ref 6.0–8.3)

## 2020-05-28 LAB — LIPID PANEL
Cholesterol: 145 mg/dL (ref 0–200)
HDL: 49 mg/dL (ref >39.00)
LDL Cholesterol: 84 mg/dL (ref 0–99)
NonHDL: 95.57
Total CHOL/HDL Ratio: 3
Triglycerides: 57 mg/dL (ref 0.0–149.0)
VLDL: 11.4 mg/dL (ref 0.0–40.0)

## 2020-05-28 LAB — TSH: TSH: 0.8 u[IU]/mL (ref 0.35–4.50)

## 2020-05-28 NOTE — Progress Notes (Signed)
Subjective:    Patient ID: Amber Stephens, female    DOB: 02-Jan-1976, 45 y.o.   MRN: [id number]  HPI  Patient presents today for complete physical.  Immunizations:  Pfizer x 3, Tdap 2015 Diet:working on diet Wt Readings from Last 3 Encounters:  05/28/20 185 lb (83.9 kg)  10/31/19 173 lb 12.8 oz (78.8 kg)  07/13/19 173 lb (78.5 kg)  Exercise: 2-3 days week Colonoscopy: due Pap Smear: hysterectomy [facility]ammogram: 12/21- [facility] Dental: up to date Vision: due- she will schedule  Review of Systems  Constitutional: Negative for unexpected weight change.  HENT: Positive for rhinorrhea (mild allergy symptoms). Negative for hearing loss.   Eyes: Negative for visual disturbance.  Respiratory: Negative for cough and shortness of breath.   Cardiovascular: Negative for chest pain.  Gastrointestinal: Negative for constipation and diarrhea.  Genitourinary: Negative for dysuria, frequency and hematuria.  Musculoskeletal: Negative for arthralgias and myalgias.  Skin: Negative for rash.  Hematological: Negative for adenopathy.  Psychiatric/Behavioral:       Denies depression/anxiety   Past Medical History:  Diagnosis Date  . Allergic rhinitis   . Anemia    borderline   . Asthma   . History of pneumonia age 64 and agin   my mom said at age 28 and again at age 62      Social History   Socioeconomic History  . Marital status: Married    Spouse name: marcus  . Number of children: 4  . Years of education: Not on file  . Highest education level: Not on file  Occupational History  . 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 Smoking status: Never Smoker  . Smokeless tobacco: Never Used  Vaping Use  . Vaping Use: Never used  Substance and Sexual Activity  . Alcohol use: No    Alcohol/week: 0.0 standard drinks  . Drug use: No  . Sexual activity: Yes    Partners: Male  Other Topics Concern  . Not on file  Social History Narrative    4 kids (G4P3)      2002- son, Adline Mango.   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 ABDOMINAL HYSTERECTOMY N/A 06/16/2017   Procedure: HYSTERECTOMY ABDOMINAL TOTAL;  Surgeon: Maisie Fus, MD;  Location: [facility];  Service: Gynecology;  Laterality: N/A;  . BILATERAL SALPINGECTOMY Bilateral 06/16/2017   Procedure: BILATERAL SALPINGECTOMY;  Surgeon: Maisie Fus, MD;  Location: [facility];  Service: Gynecology;  Laterality: Bilateral;  . BTL  11/2008   Dr Corinna Capra; bilateral tubal ligation   . CESAREAN SECTION     x4 , with each child , has 4 children   . DILATION AND CURETTAGE OF UTERUS  07/02/2012  . MYOMECTOMY  2001   Dr Evette Cristal at [facility]     Family History  Problem Relation Age of  Onset  . Alzheimer's disease Mother        died at 98 (diagnosed at 6)  . Stroke Mother   . Hypertension Father   . Stroke Maternal Grandmother   . Diabetes Maternal Grandmother   . Diabetes Maternal Grandfather        (had bilateral amputation and blindness)  . Colon cancer Maternal Grandfather 54  . AAA (abdominal aortic aneurysm) Maternal Grandfather     No Known Allergies  Current Outpatient Medications on File Prior to Visit  Medication Sig Dispense Refill  . albuterol (PROAIR HFA) 108 (90 Base) MCG/ACT inhaler USE 1-2 PUFFS BY MOUTH EVERY 4 HOURS AS NEEDED wheezing and shortness of breath 3 Inhaler 6  . amLODipine (NORVASC) 5 MG tablet TAKE 1 TABLET  BY MOUTH EVERY DAY 90 tablet 1  . estradiol (VIVELLE-DOT) 0.1 MG/24HR patch Place 1 patch onto the skin 2 (two) times a week.    . nystatin (MYCOSTATIN/NYSTOP) powder Apply 1 application topically 2 (two) times daily. 30 g 1   No current facility-administered medications on file prior to visit.    BP 126/89 (BP Location: Right Arm, Patient Position: Sitting, Cuff Size: Large)   Pulse 61   Temp 98 F (36.7 C) (Oral)   Resp 16   Ht 5\' 2"  (1.575 m)   Wt 185 lb (83.9 kg)   SpO2 100%   BMI 33.84 kg/m       Objective:   Physical Exam  Physical Exam  Constitutional: She is oriented to person, place, and time. She appears well-developed and well-nourished. No distress.  HENT:  Head: Normocephalic and atraumatic.  Right Ear: Tympanic membrane and ear canal normal.  Left Ear: Tympanic membrane and ear canal normal.  Mouth/Throat: not examined- pt wearing mask Eyes: Pupils are equal, round, and reactive to light. No scleral icterus.  Neck: Normal range of motion. No thyromegaly present.  Cardiovascular: Normal rate and regular rhythm.   No murmur heard. Pulmonary/Chest: Effort normal and breath sounds normal. No respiratory distress. He has no wheezes. She has no rales. She exhibits no tenderness.  Abdominal: Soft. Bowel sounds are normal. She exhibits no distension and no mass. There is no tenderness. There is no rebound and no guarding.  Musculoskeletal: She exhibits no edema.  Lymphadenopathy:    She has no cervical adenopathy.  Neurological: She is alert and oriented to person, place, and time. She has normal patellar reflexes. She exhibits normal muscle tone. Coordination normal.  Skin: Skin is warm and dry.  Psychiatric: She has a normal mood and affect. Her behavior is normal. Judgment and thought content normal.           Assessment & Plan:   Preventative care- encouraged pt to continue healthy diet, exercise and weight loss. Mammo up to date. Will request report from  GYN. Refer for colonoscopy.  Immunizations reviewed and up to date.   This visit occurred during the SARS-CoV-2 public health emergency.  Safety protocols were in place, including screening questions prior to the visit, additional usage of staff PPE, and extensive cleaning of exam room while observing appropriate contact time as indicated for disinfecting solutions.         Assessment & Plan:

## 2020-05-28 NOTE — Patient Instructions (Addendum)
Please complete lab work prior to leaving. Keep up the good work with healthy diet, exercise and weight loss efforts. Schedule a routine eye exam.  Please complete lab work prior to leaving.

## 2020-08-05 ENCOUNTER — Ambulatory Visit (AMBULATORY_SURGERY_CENTER): Payer: 59 | Admitting: *Deleted

## 2020-08-05 VITALS — Ht 62.0 in | Wt 185.0 lb

## 2020-08-05 DIAGNOSIS — Z1211 Encounter for screening for malignant neoplasm of colon: Secondary | ICD-10-CM

## 2020-08-05 MED ORDER — PLENVU 140 G PO SOLR: 1.0000 | Freq: Once | ORAL | 0 refills | Status: AC

## 2020-08-05 NOTE — Progress Notes (Signed)
No egg or soy allergy known to patient  No issues with past sedation with any surgeries or procedures Patient denies ever being told they had issues or difficulty with intubation  No FH of Malignant Hyperthermia No diet pills per patient No home 02 use per patient  No blood thinners per patient  Pt denies issues with constipation  No A fib or A flutter  EMMI video to pt or via MyChart  Virtual PV visit  Plenvu Coupon given to pt in PV today , Code to Pharmacy and  NO PA's for preps discussed with pt In PV today  Discussed with pt there will be an out-of-pocket cost for prep and that varies from $0 to 70 dollars   Due to the COVID-19 pandemic we are asking patients to follow certain guidelines.  Pt aware of COVID protocols and [facility] guidelines

## 2020-08-19 ENCOUNTER — Ambulatory Visit (AMBULATORY_SURGERY_CENTER): Payer: 59 | Admitting: Gastroenterology

## 2020-08-19 ENCOUNTER — Other Ambulatory Visit: Payer: Self-pay

## 2020-08-19 ENCOUNTER — Encounter: Payer: Self-pay | Admitting: Gastroenterology

## 2020-08-19 VITALS — BP 129/78 | HR 53 | Temp 97.5°F | Resp 11 | Ht 62.0 in | Wt 185.0 lb

## 2020-08-19 DIAGNOSIS — D125 Benign neoplasm of sigmoid colon: Secondary | ICD-10-CM

## 2020-08-19 DIAGNOSIS — Z800 Family history of malignant neoplasm of digestive organs: Secondary | ICD-10-CM

## 2020-08-19 DIAGNOSIS — D120 Benign neoplasm of cecum: Secondary | ICD-10-CM

## 2020-08-19 DIAGNOSIS — Z120 Encounter for screening for malignant neoplasm of stomach: Secondary | ICD-10-CM | POA: Diagnosis not present

## 2020-08-19 DIAGNOSIS — Z1211 Encounter for screening for malignant neoplasm of colon: Secondary | ICD-10-CM | POA: Diagnosis present

## 2020-08-19 MED ORDER — SODIUM CHLORIDE 0.9 % IV SOLN: 500.0000 mL | INTRAVENOUS | Status: DC

## 2020-08-19 NOTE — Patient Instructions (Signed)
2 polyps removed- please read over handout about polyps  Await pathology  Continue your normal medications  YOU HAD AN ENDOSCOPIC PROCEDURE TODAY AT [place]:   Refer to the procedure report that was given to you for any specific questions about what was found during the examination.  If the procedure report does not answer your questions, please call your gastroenterologist to clarify.  If you requested that your care partner not be given the details of your procedure findings, then the procedure report has been included in a sealed envelope for you to review at your convenience later.  YOU SHOULD EXPECT: Some feelings of bloating in the abdomen. Passage of more gas than usual.  Walking can help get rid of the air that was put into your GI tract during the procedure and reduce the bloating. If you had a lower endoscopy (such as a colonoscopy or flexible sigmoidoscopy) you may notice spotting of blood in your stool or on the toilet paper. If you underwent a bowel prep for your procedure, you may not have a normal bowel movement for a few days.  Please Note:  You might notice some irritation and congestion in your nose or some drainage.  This is from the oxygen used during your procedure.  There is no need for concern and it should clear up in a day or so.  SYMPTOMS TO REPORT IMMEDIATELY:  Following lower endoscopy (colonoscopy or flexible sigmoidoscopy):  Excessive amounts of blood in the stool  Significant tenderness or worsening of abdominal pains  Swelling of the abdomen that is new, acute  Fever of 100F or higher  For urgent or emergent issues, a gastroenterologist can be reached at any hour by calling [phone]. Do not use MyChart messaging for urgent concerns.    DIET:  We do recommend a small meal at first, but then you may proceed to your regular diet.  Drink plenty of fluids but you should avoid alcoholic beverages for 24 hours.  ACTIVITY:  You should plan  to take it easy for the rest of today and you should NOT DRIVE or use heavy machinery until tomorrow (because of the sedation medicines used during the test).    FOLLOW UP: Our staff will call the number listed on your records 48-72 hours following your procedure to check on you and address any questions or concerns that you may have regarding the information given to you following your procedure. If we do not reach you, we will leave a message.  We will attempt to reach you two times.  During this call, we will ask if you have developed any symptoms of COVID 19. If you develop any symptoms (ie: fever, flu-like symptoms, shortness of breath, cough etc.) before then, please call [phone].  If you test positive for Covid 19 in the 2 weeks post procedure, please call and report this information to [place].    If any biopsies were taken you will be contacted by phone or by letter within the next 1-3 weeks.  Please call us at [phone] if you have not heard about the biopsies in 3 weeks.    SIGNATURES/CONFIDENTIALITY: You and/or your care partner have signed paperwork which will be entered into your electronic medical record.  These signatures attest to the fact that that the information above on your After Visit Summary has been reviewed and is understood.  Full responsibility of the confidentiality of this discharge information lies with you and/or your care-partner.

## 2020-08-19 NOTE — Progress Notes (Signed)
pt tolerated well. VSS. awake and to recovery. Report given to RN.  

## 2020-08-19 NOTE — Progress Notes (Signed)
CW - VS   

## 2020-08-19 NOTE — Progress Notes (Signed)
Called to room to assist during endoscopic procedure.  Patient ID and intended procedure confirmed with present staff. Received instructions for my participation in the procedure from the performing physician.  

## 2020-08-19 NOTE — Op Note (Signed)
[facility] Patient Name: Amber Stephens Procedure Date: 08/19/2020 9:33 AM MRN: [id number] Endoscopist: Gerrit Heck , MD Age: 45 Referring MD:  Date of Birth: 1975/06/17 Gender: Female Account #: [id number] Procedure:                Colonoscopy Indications:              Screening for colorectal malignant neoplasm, This                            is the patient's first colonoscopy                           Family history notable for maternal grandfather                            with Colon Cancer ~age 67. She is otherwise without                            active lower GI symptoms. Medicines:                Monitored Anesthesia Care Procedure:                Pre-Anesthesia Assessment:                           - Prior to the procedure, a History and Physical                            was performed, and patient medications and                            allergies were reviewed. The patient's tolerance of                            previous anesthesia was also reviewed. The risks                            and benefits of the procedure and the sedation                            options and risks were discussed with the patient.                            All questions were answered, and informed consent                            was obtained. Prior Anticoagulants: The patient has                            taken no previous anticoagulant or antiplatelet                            agents. ASA Grade Assessment: II - A patient with  mild systemic disease. After reviewing the risks                            and benefits, the patient was deemed in                            satisfactory condition to undergo the procedure.                           After obtaining informed consent, the colonoscope                            was passed under direct vision. Throughout the                            procedure, the patient's blood pressure, pulse,  and                            oxygen saturations were monitored continuously. The                            Colonoscope was introduced through the anus and                            advanced to the the cecum, identified by the                            appendiceal orifice, IC valve and                            transillumination. The colonoscopy was performed                            without difficulty. The patient tolerated the                            procedure well. The quality of the bowel                            preparation was good. The ileocecal valve,                            appendiceal orifice, and rectum were photographed. Scope In: 9:48:03 AM Scope Out: 10:02:43 AM Scope Withdrawal Time: 0 hours 11 minutes 38 seconds  Total Procedure Duration: 0 hours 14 minutes 40 seconds  Findings:                 The perianal and digital rectal examinations were                            normal.                           Two sessile polyps were found in the sigmoid colon  and cecum. The polyps were 3 to 4 mm in size. These                            polyps were removed with a cold snare. Resection                            and retrieval were complete. Estimated blood loss                            was minimal.                           The exam was otherwise normal throughout the                            remainder of the colon.                           The retroflexed view of the distal rectum and anal                            verge was normal and showed no anal or rectal                            abnormalities. Complications:            No immediate complications. Estimated Blood Loss:     Estimated blood loss was minimal. Impression:               - Two 3 to 4 mm polyps in the sigmoid colon and in                            the cecum, removed with a cold snare. Resected and                            retrieved.                            - The distal rectum and anal verge are normal on                            retroflexion view. Recommendation:           - Patient has a contact number available for                            emergencies. The signs and symptoms of potential                            delayed complications were discussed with the                            patient. Return to normal activities tomorrow.  Written discharge instructions were provided to the                            patient.                           - Resume previous diet.                           - Continue present medications.                           - Await pathology results.                           - Repeat colonoscopy for surveillance based on                            pathology results.                           - Return to GI office PRN. Gerrit Heck, MD 08/19/2020 10:07:07 AM

## 2020-08-21 ENCOUNTER — Telehealth: Payer: Self-pay

## 2020-08-21 NOTE — Telephone Encounter (Signed)
  Follow up Call-  Call back number 08/19/2020  Post procedure Call Back phone  # [phone]  Permission to leave phone message Yes  Some recent data might be hidden     Patient questions:  Do you have a fever, pain , or abdominal swelling? No. Pain Score  0 *  Have you tolerated food without any problems? Yes.    Have you been able to return to your normal activities? Yes.    Do you have any questions about your discharge instructions: Diet   No. Medications  No. Follow up visit  No.  Do you have questions or concerns about your Care? No.  Actions: * If pain score is 4 or above: No action needed, pain <4.  Have you developed a fever since your procedure? no  2.   Have you had an respiratory symptoms (SOB or cough) since your procedure? no  3.   Have you tested positive for COVID 19 since your procedure no  4.   Have you had any family members/close contacts diagnosed with the COVID 19 since your procedure?  no   If yes to any of these questions please route to Joylene John, RN and Joella Prince, RN

## 2020-08-22 ENCOUNTER — Encounter: Payer: Self-pay | Admitting: Gastroenterology

## 2020-09-15 ENCOUNTER — Other Ambulatory Visit: Payer: Self-pay | Admitting: Family

## 2020-11-26 ENCOUNTER — Ambulatory Visit: Payer: 59 | Admitting: Family

## 2020-12-03 ENCOUNTER — Encounter: Payer: Self-pay | Admitting: Family

## 2020-12-03 ENCOUNTER — Other Ambulatory Visit: Payer: Self-pay

## 2020-12-03 ENCOUNTER — Ambulatory Visit: Payer: 59 | Admitting: Family

## 2020-12-03 DIAGNOSIS — J4520 Mild intermittent asthma, uncomplicated: Secondary | ICD-10-CM

## 2020-12-03 DIAGNOSIS — E663 Overweight: Secondary | ICD-10-CM | POA: Diagnosis not present

## 2020-12-03 DIAGNOSIS — I10 Essential (primary) hypertension: Secondary | ICD-10-CM | POA: Diagnosis not present

## 2020-12-03 DIAGNOSIS — Z23 Encounter for immunization: Secondary | ICD-10-CM

## 2020-12-03 DIAGNOSIS — J301 Allergic rhinitis due to pollen: Secondary | ICD-10-CM

## 2020-12-03 DIAGNOSIS — E669 Obesity, unspecified: Secondary | ICD-10-CM | POA: Insufficient documentation

## 2020-12-03 NOTE — Assessment & Plan Note (Signed)
Continue allegra prn for seasonal allergies.

## 2020-12-03 NOTE — Assessment & Plan Note (Signed)
Stable. Continue prn albuterol MDI.

## 2020-12-03 NOTE — Assessment & Plan Note (Signed)
Encouraged her to restart meal prepping, increase exercise.  Refer to [facility].

## 2020-12-03 NOTE — Progress Notes (Signed)
Subjective:   By signing my name below, I, Shehryar Baig, attest that this documentation has been prepared under the direction and in the presence of Debbrah Alar NP. 12/03/2020    Patient ID: Amber Stephens, female    DOB: [birth year], 45 y.o.   MRN: [id number]  Chief Complaint  Patient presents with   Hypertension    Here for follow up    Hypertension  Patient is in today for a office visit.   Blood pressure- Her blood pressure is slightly elevated during this visit. She reports gaining weight which has contributed to her weight loss. She continues taking 5 mg amlodipine daily PO and reports no new issues while taking it.   BP Readings from Last 3 Encounters:  12/03/20 (!) 142/81  08/19/20 129/78  05/28/20 126/89   Pulse Readings from Last 3 Encounters:  12/03/20 78  08/19/20 (!) 53  05/28/20 61   Weight- She has gained 13 lb's since her last visit. She reports having trouble cooking food at home due to time constraints. She is interested in trying appetite suppressant medication to assist her weight loss. She was informed of a healthy weight and wellness program and is interested in it.   Wt Readings from Last 3 Encounters:  12/03/20 198 lb (89.8 kg)  08/19/20 185 lb (83.9 kg)  08/05/20 185 lb (83.9 kg)   Immunizations- She has 3 pfizer Covid-19 vaccines. She was informed of the bivalent Covid-19 vaccine. She is interested in receiving the flu vaccine.    Health Maintenance Due  Topic Date Due   Pneumococcal Vaccine 24-67 Years old (1 - PCV) Never done   HIV Screening  Never done   Hepatitis C Screening  Never done   PAP SMEAR-Modifier  07/15/2017   COVID-19 Vaccine (4 - Booster for Pfizer series) 03/24/2020   INFLUENZA VACCINE  09/01/2020    Past Medical History:  Diagnosis Date   Allergic rhinitis    Anemia    borderline, prior to hysterectomy   Asthma    History of pneumonia age 2 and agin   my mom said at age 7 and again at age 47     Hypertension 05/28/2020    Past Surgical History:  Procedure Laterality Date   ABDOMINAL HYSTERECTOMY N/A 06/16/2017   Procedure: HYSTERECTOMY ABDOMINAL TOTAL;  Surgeon: Maisie Fus, MD;  Location: [facility];  Service: Gynecology;  Laterality: N/A;   BILATERAL SALPINGECTOMY Bilateral 06/16/2017   Procedure: BILATERAL SALPINGECTOMY;  Surgeon: Maisie Fus, MD;  Location: [facility];  Service: Gynecology;  Laterality: Bilateral;   BTL  11/2008   Dr Corinna Capra; bilateral tubal ligation    CESAREAN SECTION     x4 , with each child , has 4 children    DILATION AND CURETTAGE OF UTERUS  07/02/2012   MYOMECTOMY  2001   Dr Evette Cristal at [facility]     Family History  Problem Relation Age of Onset   Alzheimer's disease Mother        died at 81 (diagnosed at 24)   Stroke Mother    Hypertension Father    Stroke Maternal Grandmother    Diabetes Maternal Grandmother    Diabetes Maternal Grandfather        (had bilateral amputation and blindness)   Colon cancer Maternal Grandfather 49   AAA (abdominal aortic aneurysm) Maternal Grandfather    Stomach cancer Neg Hx    Esophageal cancer Neg Hx  Social History   Socioeconomic History   Marital status: Married    Spouse name: marcus   Number of children: 4   Years of education: Not on file   Highest education level: Not on file  Occupational History   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 [employer]      Enjoys  reading, shopping, cooking      Married 21 years    Social Determinants of Radio broadcast assistant Strain: Not on [employer] Insecurity: Not on file  Transportation Needs: Not on file  Physical Activity: Not on file  Stress: Not on file  Social Connections: Not on file  Intimate Partner Violence: Not on file    Outpatient Medications Prior to Visit  Medication Sig Dispense Refill   albuterol (PROAIR HFA) 108 (90 Base) MCG/ACT inhaler USE 1-2 PUFFS BY MOUTH EVERY 4 HOURS AS NEEDED wheezing and shortness of breath 3 Inhaler 6   amLODipine (NORVASC) 5 MG tablet TAKE 1 TABLET BY MOUTH EVERY DAY 90 tablet 1   estradiol (VIVELLE-DOT) 0.1 MG/24HR patch Place 1 patch onto the skin 2 (two) times a week.     nystatin (MYCOSTATIN/NYSTOP) powder Apply 1 application topically 2 (two) times daily. 30 g 1   No facility-administered medications prior to visit.    No Known Allergies  ROS     Objective:    Physical Exam Constitutional:      General: She is not in acute distress.    Appearance: Normal appearance. She is not ill-appearing.  HENT:     Head: Normocephalic and atraumatic.     Right Ear: External ear normal.     Left Ear: External ear normal.  Eyes:     Extraocular Movements: Extraocular movements intact.     Pupils: Pupils are equal, round, and reactive to light.  Cardiovascular:     Rate and Rhythm: Normal rate and regular rhythm.     Heart sounds: Normal heart sounds. No murmur heard.   No gallop.  Pulmonary:     Effort: Pulmonary effort is normal. No respiratory distress.     Breath sounds: Normal breath sounds. No wheezing or rales.  Skin:    General: Skin is warm and dry.  Neurological:     Mental Status: She is alert and oriented to person, place, and time.  Psychiatric:        Behavior: Behavior normal.    BP (!) 142/81 (BP Location: Right Arm, Patient Position: Sitting, Cuff Size: Large)   Pulse 78   Temp 98.2 F (36.8 C) (Oral)   Resp 16   Wt 198  lb (89.8 kg)   LMP 06/04/2017 Comment: "saw some spotting "   SpO2 100%   BMI 36.21 kg/m  Wt Readings from Last 3 Encounters:  12/03/20 198 lb (89.8 kg)  08/19/20 185 lb (83.9 kg)  08/05/20 185 lb (  83.9 kg)       Assessment & Plan:   Problem List Items Addressed This Visit       Unprioritized   Overweight    Encouraged her to restart meal prepping, increase exercise.  Refer to Healthy Weight and Wellness.       Relevant Orders   Amb Ref to Medical Weight Management   Hypertension    BP Readings from Last 3 Encounters:  12/03/20 (!) 142/81  08/19/20 129/78  05/28/20 126/89   Wt Readings from Last 3 Encounters:  12/03/20 198 lb (89.8 kg)  08/19/20 185 lb (83.9 kg)  08/05/20 185 lb (83.9 kg)  She has gained some weight. We discussed low sodium diet/exercise/weight loss. Will continue amlodipine 5mg  once daily. Plan to repeat bp in 3 months, if still above goal, may need to adjust amlodipine dose.        Asthma    Stable. Continue prn albuterol MDI.      Allergic rhinitis    Continue allegra prn for seasonal allergies.       Other Visit Diagnoses     Needs flu shot       Relevant Orders   Flu Vaccine QUAD 6+ mos PF IM (Fluarix Quad PF)        No orders of the defined types were placed in this encounter.   I, Debbrah Alar NP, personally preformed the services described in this documentation.  All medical record entries made by the scribe were at my direction and in my presence.  I have reviewed the chart and discharge instructions (if applicable) and agree that the record reflects my personal performance and is accurate and complete. 12/03/2020   I,Shehryar Baig,acting as a Education administrator for Nance Pear, NP.,have documented all relevant documentation on the behalf of Nance Pear, NP,as directed by  Nance Pear, NP while in the presence of Nance Pear, NP.   Nance Pear, NP

## 2020-12-03 NOTE — Assessment & Plan Note (Addendum)
BP Readings from Last 3 Encounters:  12/03/20 (!) 142/81  08/19/20 129/78  05/28/20 126/89   Wt Readings from Last 3 Encounters:  12/03/20 198 lb (89.8 kg)  08/19/20 185 lb (83.9 kg)  08/05/20 185 lb (83.9 kg)   She has gained some weight. We discussed low sodium diet/exercise/weight loss. Will continue amlodipine 5mg  once daily. Plan to repeat bp in 3 months, if still above goal, may need to adjust amlodipine dose.

## 2021-01-30 ENCOUNTER — Telehealth: Payer: 59 | Admitting: Physician Assistant

## 2021-01-30 DIAGNOSIS — B9689 Other specified bacterial agents as the cause of diseases classified elsewhere: Secondary | ICD-10-CM

## 2021-01-30 DIAGNOSIS — J208 Acute bronchitis due to other specified organisms: Secondary | ICD-10-CM | POA: Diagnosis not present

## 2021-01-30 DIAGNOSIS — J4520 Mild intermittent asthma, uncomplicated: Secondary | ICD-10-CM | POA: Diagnosis not present

## 2021-01-30 MED ORDER — PREDNISONE 10 MG (21) PO TBPK: ORAL_TABLET | ORAL | 0 refills | Status: DC

## 2021-01-30 MED ORDER — AZITHROMYCIN 250 MG PO TABS: ORAL_TABLET | ORAL | 0 refills | Status: AC

## 2021-01-30 MED ORDER — BENZONATATE 100 MG PO CAPS
100.0000 mg | ORAL_CAPSULE | Freq: Three times a day (TID) | ORAL | 0 refills | Status: DC | PRN
Start: 2021-01-30 — End: 2021-03-26

## 2021-01-30 NOTE — Progress Notes (Signed)

## 2021-02-18 LAB — HM PAP SMEAR: HM Pap smear: NEGATIVE

## 2021-03-11 ENCOUNTER — Ambulatory Visit: Payer: 59 | Admitting: Family

## 2021-03-13 ENCOUNTER — Other Ambulatory Visit: Payer: Self-pay | Admitting: Family

## 2021-03-19 DIAGNOSIS — Z0289 Encounter for other administrative examinations: Secondary | ICD-10-CM

## 2021-03-26 ENCOUNTER — Encounter (INDEPENDENT_AMBULATORY_CARE_PROVIDER_SITE_OTHER): Payer: Self-pay | Admitting: Bariatrics

## 2021-03-26 ENCOUNTER — Ambulatory Visit (INDEPENDENT_AMBULATORY_CARE_PROVIDER_SITE_OTHER): Payer: 59 | Admitting: Bariatrics

## 2021-03-26 ENCOUNTER — Other Ambulatory Visit: Payer: Self-pay

## 2021-03-26 VITALS — BP 131/88 | Ht 62.0 in | Wt 205.0 lb

## 2021-03-26 DIAGNOSIS — I10 Essential (primary) hypertension: Secondary | ICD-10-CM | POA: Diagnosis not present

## 2021-03-26 DIAGNOSIS — E669 Obesity, unspecified: Secondary | ICD-10-CM

## 2021-03-26 DIAGNOSIS — E559 Vitamin D deficiency, unspecified: Secondary | ICD-10-CM

## 2021-03-26 DIAGNOSIS — R5383 Other fatigue: Secondary | ICD-10-CM

## 2021-03-26 DIAGNOSIS — J4520 Mild intermittent asthma, uncomplicated: Secondary | ICD-10-CM | POA: Diagnosis not present

## 2021-03-26 DIAGNOSIS — Z1331 Encounter for screening for depression: Secondary | ICD-10-CM | POA: Diagnosis not present

## 2021-03-26 DIAGNOSIS — R7309 Other abnormal glucose: Secondary | ICD-10-CM | POA: Diagnosis not present

## 2021-03-26 DIAGNOSIS — R0602 Shortness of breath: Secondary | ICD-10-CM | POA: Diagnosis not present

## 2021-03-26 DIAGNOSIS — Z6837 Body mass index (BMI) 37.0-37.9, adult: Secondary | ICD-10-CM

## 2021-03-26 NOTE — Progress Notes (Signed)
Chief Complaint:   OBESITY Amber Stephens (MR# [id number]) is a 46 y.o. female who presents for evaluation and treatment of obesity and related comorbidities. Current BMI is Body mass index is 37.49 kg/m. Amber Stephens has been struggling with her weight for many years and has been unsuccessful in either losing weight, maintaining weight loss, or reaching her healthy weight goal.  Tamika states that she does not really like to cook due to the time consuming factor.   Keishawna is currently in the action stage of change and ready to dedicate time achieving and maintaining a healthier weight. Amber Stephens is interested in becoming our patient and working on intensive lifestyle modifications including (but not limited to) diet and exercise for weight loss.  Jayliani's habits were reviewed today and are as follows: Her family eats meals together, she struggles with family and or coworkers weight loss sabotage, her desired weight loss is 50 lbs, she started gaining weight in the last 2 years, her heaviest weight ever was 204 pounds, she has significant food cravings issues, she snacks frequently in the evenings, she is frequently drinking liquids with calories, she frequently makes poor food choices, she frequently eats larger portions than normal, and she struggles with emotional eating.  Depression Screen Halah's Food and Mood (modified PHQ-9) score was 7.  Depression screen [facility] 2/9 03/26/2021  Decreased Interest 1  Down, Depressed, Hopeless 1  PHQ - 2 Score 2  Altered sleeping 1  Tired, decreased energy 3  Change in appetite 1  Feeling bad or failure about yourself  0  Trouble concentrating 0  Moving slowly or fidgety/restless 0  Suicidal thoughts 0  PHQ-9 Score 7  Difficult doing work/chores Not difficult at all   Subjective:   1. Other fatigue Marlee admits to daytime somnolence and admits to waking up still tired. Patient has a history of symptoms of daytime fatigue and morning  fatigue. Amber Stephens generally gets 5 or 6 hours of sleep per night, and states that she has difficulty falling asleep. Snoring is present. Apneic episodes are not present. Epworth Sleepiness Score is 6.   2. Shortness of breath on exertion Amber Stephens notes increasing shortness of breath with exercising and seems to be worsening over time with weight gain. She notes getting out of breath sooner with activity than she used to. This has not gotten worse recently. Acire denies shortness of breath at rest or orthopnea.  3. Essential hypertension Amber Stephens is taking Norvasc currently.  4. Mild intermittent asthma without complication Amber Stephens trigger is smoke. She uses albuterol as needed.  5. Vitamin D deficiency Amber Stephens is not on Vitamin D supplementations.  6. Elevated glucose Amber Stephens has a family history of diabetes mellitus II with her grandparents.  Assessment/Plan:   1. Other fatigue Amber Stephens does feel that her weight is causing her energy to be lower than it should be. Fatigue may be related to obesity, depression or many other causes. Labs will be ordered, and in the meanwhile, [name] will focus on self care including making healthy food choices, increasing physical activity and focusing on stress reduction.  - EKG 12-Lead - Lipid Panel With LDL/HDL Ratio - TSH+T4F+T3Free  2. Shortness of breath on exertion Amber Stephens does feel that she gets out of breath more easily that she used to when she exercises. Amber Stephens's shortness of breath appears to be obesity related and exercise induced. She has agreed to work on weight loss and gradually increase exercise to treat her exercise induced shortness of breath.  Will continue to monitor closely.  3. Essential hypertension We will check labs today. Amber Stephens will continue Norvasc, and will work on healthy weight loss and exercise to improve blood pressure control. We will watch for signs of hypotension as she continues her lifestyle modifications.  -  Comprehensive metabolic panel  4. Mild intermittent asthma without complication Amber Stephens will continue to use albuterol inhaler as needed.  5. Vitamin D deficiency Low Vitamin D level contributes to fatigue and are associated with obesity, breast, and colon cancer. We will check labs today. Amber Stephens will follow-up for routine testing of Vitamin D, at least 2-3 times per year to avoid over-replacement.  - VITAMIN D 25 Hydroxy (Vit-D Deficiency, Fractures)  6. Elevated glucose We will check labs today, and will follow up at Amber Stephens's next visit.  - Hemoglobin A1c - Insulin, random  7. Depression screening Amber Stephens had a positive depression screening. Depression is commonly associated with obesity and often results in emotional eating behaviors. We will monitor this closely and work on CBT to help improve the non-hunger eating patterns. Referral to Psychology may be required if no improvement is seen as she continues in our clinic.  8. Obesity, with current BMI of 37.5 Amber Stephens is currently in the action stage of change and her goal is to continue with weight loss efforts. I recommend Amber Stephens begin the structured treatment plan as follows:  She has agreed to the Category 2 Plan.  Meal planning. Intentional eating. Reviewed labs from 4/27/202 CMP, lipids. 04/03/2019 glucose, TSH. No sugary drinks.  Exercise goals: Ride bike for 30 minutes.  Behavioral modification strategies: increasing lean protein intake, decreasing simple carbohydrates, increasing vegetables, increasing water intake, decreasing eating out, no skipping meals, meal planning and cooking strategies, keeping healthy foods in the home, and planning for success.  She was informed of the importance of frequent follow-up visits to maximize her success with intensive lifestyle modifications for her multiple health conditions. She was informed we would discuss her lab results at her next visit unless there is a critical issue that needs  to be addressed sooner. [name] agreed to keep her next visit at the agreed upon time to discuss these results.  Objective:   Blood pressure 131/88, height 5\' 2"  (1.575 m), weight 205 lb (93 kg), last menstrual period 06/04/2017. Body mass index is 37.49 kg/m.  EKG: Normal sinus rhythm, rate 68 BPM.  Indirect Calorimeter completed today shows a VO2 of 258 and a REE of 1786.  Her calculated basal metabolic rate is 7425 thus her basal metabolic rate is better than expected.  General: Cooperative, alert, well developed, in no acute distress. HEENT: Conjunctivae and lids unremarkable. Cardiovascular: Regular rhythm.  Lungs: Normal work of breathing. Neurologic: No focal deficits.   Lab Results  Component Value Date   CREATININE 0.93 05/28/2020   BUN 12 05/28/2020   NA 138 05/28/2020   K 4.3 05/28/2020   CL 103 05/28/2020   CO2 29 05/28/2020   Lab Results  Component Value Date   ALT 11 05/28/2020   AST 13 05/28/2020   ALKPHOS 52 05/28/2020   BILITOT 0.3 05/28/2020   No results found for: HGBA1C No results found for: INSULIN Lab Results  Component Value Date   TSH 0.80 05/28/2020   Lab Results  Component Value Date   CHOL 145 05/28/2020   HDL 49.00 05/28/2020   LDLCALC 84 05/28/2020   TRIG 57.0 05/28/2020   CHOLHDL 3 05/28/2020   Lab Results  Component Value Date  WBC 5.1 04/03/2019   HGB 13.1 04/03/2019   HCT 39.0 04/03/2019   MCV 89.6 04/03/2019   PLT 332.0 04/03/2019   Lab Results  Component Value Date   IRON 106 10/30/2012   Attestation Statements:   Reviewed by clinician on day of visit: allergies, medications, problem list, medical history, surgical history, family history, social history, and previous encounter notes.   Wilhemena Durie, am acting as Location manager for [employer], DO.  I have reviewed the above documentation for accuracy and completeness, and I agree with the above. Jearld Lesch, DO

## 2021-03-27 ENCOUNTER — Ambulatory Visit: Payer: 59 | Admitting: Family

## 2021-03-27 ENCOUNTER — Encounter: Payer: Self-pay | Admitting: Family

## 2021-03-27 DIAGNOSIS — J301 Allergic rhinitis due to pollen: Secondary | ICD-10-CM | POA: Diagnosis not present

## 2021-03-27 DIAGNOSIS — J4520 Mild intermittent asthma, uncomplicated: Secondary | ICD-10-CM | POA: Diagnosis not present

## 2021-03-27 DIAGNOSIS — I10 Essential (primary) hypertension: Secondary | ICD-10-CM | POA: Diagnosis not present

## 2021-03-27 LAB — COMPREHENSIVE METABOLIC PANEL
ALT: 20 IU/L (ref 0–32)
AST: 21 IU/L (ref 0–40)
Albumin/Globulin Ratio: 1.5 (ref 1.2–2.2)
Albumin: 4.6 g/dL (ref 3.8–4.8)
Alkaline Phosphatase: 59 IU/L (ref 44–121)
BUN/Creatinine Ratio: 20 (ref 9–23)
BUN: 19 mg/dL (ref 6–24)
Bilirubin Total: <0.2 mg/dL (ref 0.0–1.2)
CO2: 23 mmol/L (ref 20–29)
Calcium: 9.7 mg/dL (ref 8.7–10.2)
Chloride: 104 mmol/L (ref 96–106)
Creatinine, Ser: 0.96 mg/dL (ref 0.57–1.00)
Globulin, Total: 3 g/dL (ref 1.5–4.5)
Glucose: 93 mg/dL (ref 70–99)
Potassium: 4.4 mmol/L (ref 3.5–5.2)
Sodium: 143 mmol/L (ref 134–144)
Total Protein: 7.6 g/dL (ref 6.0–8.5)
eGFR: 74 mL/min/{1.73_m2} (ref >59)

## 2021-03-27 LAB — INSULIN, RANDOM: INSULIN: 19.1 u[IU]/mL (ref 2.6–24.9)

## 2021-03-27 LAB — VITAMIN D 25 HYDROXY (VIT D DEFICIENCY, FRACTURES): Vit D, 25-Hydroxy: 25.8 ng/mL — ABNORMAL LOW (ref 30.0–100.0)

## 2021-03-27 LAB — LIPID PANEL WITH LDL/HDL RATIO
Cholesterol, Total: 183 mg/dL (ref 100–199)
HDL: 55 mg/dL (ref >39)
LDL Chol Calc (NIH): 115 mg/dL — ABNORMAL HIGH (ref 0–99)
LDL/HDL Ratio: 2.1 ratio (ref 0.0–3.2)
Triglycerides: 69 mg/dL (ref 0–149)
VLDL Cholesterol Cal: 13 mg/dL (ref 5–40)

## 2021-03-27 LAB — HEMOGLOBIN A1C
Est. average glucose Bld gHb Est-mCnc: 117 mg/dL
Hgb A1c MFr Bld: 5.7 % — ABNORMAL HIGH (ref 4.8–5.6)

## 2021-03-27 LAB — TSH+T4F+T3FREE
Free T4: 0.99 ng/dL (ref 0.82–1.77)
T3, Free: 3.2 pg/mL (ref 2.0–4.4)
TSH: 0.913 u[IU]/mL (ref 0.450–4.500)

## 2021-03-27 MED ORDER — MONTELUKAST SODIUM 10 MG PO TABS: 10.0000 mg | ORAL_TABLET | Freq: Every day | ORAL | 3 refills | Status: DC

## 2021-03-27 MED ORDER — FLUTICASONE PROPIONATE 50 MCG/ACT NA SUSP: 2.0000 | Freq: Every day | NASAL | 6 refills | Status: DC

## 2021-03-27 NOTE — Progress Notes (Signed)
Subjective:     Patient ID: Amber Stephens, female    DOB: Oct 05, 1975, 46 y.o.   MRN: [id number]  Chief Complaint  Patient presents with   Hypertension    Here for follow up    Hypertension  Patient is in today for follow up.   Morbid obesity- last visit we discussed diet/exercise/weight loss strategies and referred her to healthy weight and wellness.  Wt Readings from Last 3 Encounters:  03/27/21 208 lb (94.3 kg)  03/26/21 205 lb (93 kg)  12/03/20 198 lb (89.8 kg)   HTN- Pt is maintained on amlodipine 5mg .  BP Readings from Last 3 Encounters:  03/27/21 137/72  03/26/21 131/88  12/03/20 (!) 142/81   Seasonal allergies- uses benadryl HS, Zyrtec during the day.  Notes chronic post nasal drip.  She tried    Health Maintenance Due  Topic Date Due   HIV Screening  Never done   Hepatitis C Screening  Never done   PAP SMEAR-Modifier  07/15/2017   COVID-19 Vaccine (4 - Booster for Pfizer series) 03/24/2020    Past Medical History:  Diagnosis Date   Allergic rhinitis    Anemia    borderline, prior to hysterectomy   Asthma    Fatigue    History of pneumonia age 73 and agin   my mom said at age 27 and again at age 6    Hypertension 05/28/2020   Multiple food allergies    Shortness of breath    Shortness of breath on exertion     Past Surgical History:  Procedure Laterality Date   ABDOMINAL HYSTERECTOMY N/A 06/16/2017   Procedure: HYSTERECTOMY ABDOMINAL TOTAL;  Surgeon: Maisie Fus, MD;  Location: [facility];  Service: Gynecology;  Laterality: N/A;   BILATERAL SALPINGECTOMY Bilateral 06/16/2017   Procedure: BILATERAL SALPINGECTOMY;  Surgeon: Maisie Fus, MD;  Location: [facility];  Service: Gynecology;  Laterality: Bilateral;   BTL  11/2008   Dr Corinna Capra; bilateral tubal ligation    CESAREAN SECTION     x4 , with each child , has 4 children    DILATION AND CURETTAGE OF UTERUS  07/02/2012   MYOMECTOMY  2001   Dr Evette Cristal at  [place] History  Problem Relation Age of Onset   Alzheimer's disease Mother        died at 99 (diagnosed at 72)   Stroke Mother    Hypertension Father    Stroke Maternal Grandmother    Diabetes Maternal Grandmother    Diabetes Maternal Grandfather        (had bilateral amputation and blindness)   Colon cancer Maternal Grandfather 79   AAA (abdominal aortic aneurysm) Maternal Grandfather    Stomach cancer Neg Hx    Esophageal cancer Neg Hx     Social History   Socioeconomic History   Marital status: Married    Spouse name: marcus   Number of children: 4   Years of education: Not on file   Highest education level: Not on file  Occupational History   Occupation: Economist: [employer]  Tobacco Use   Smoking status: Never   Smokeless tobacco: Never  Vaping Use   Vaping Use: Never used  Substance and Sexual Activity   Alcohol use: Never   Drug use: No   Sexual activity: Yes    Partners: Male  Other Topics Concern   Not on file  Social History  Narrative   4 kids (G4P3)      2002- son, Adline Mango.   2003Laurence Ferrari   [id number]   8453- [name]      Academic records Freight forwarder at [employer]   Also teaches Medical reception at [employer]      Enjoys reading, shopping, cooking      Married 21 years    Social Determinants of Health   Financial Resource Strain: Not on [employer] Insecurity: Not on file  Transportation Needs: Not on file  Physical Activity: Not on file  Stress: Not on file  Social Connections: Not on file  Intimate Partner Violence: Not on file    Outpatient Medications Prior to Visit  Medication Sig Dispense Refill   albuterol (PROAIR HFA) 108 (90 Base) MCG/ACT inhaler USE 1-2 PUFFS BY MOUTH EVERY 4 HOURS AS NEEDED wheezing and shortness of breath 3 Inhaler 6   amLODipine (NORVASC) 5 MG tablet TAKE 1 TABLET BY MOUTH EVERY DAY 90 tablet 0   estradiol (VIVELLE-DOT) 0.1 MG/24HR patch Place 1  patch onto the skin 2 (two) times a week.     No facility-administered medications prior to visit.    Allergies  Allergen Reactions   Other     Almonds    ROS    See HPI  Objective:    Physical Exam Constitutional:      General: She is not in acute distress.    Appearance: Normal appearance. She is well-developed.  HENT:     Head: Normocephalic and atraumatic.     Right Ear: External ear normal.     Left Ear: External ear normal.  Eyes:     General: No scleral icterus. Neck:     Thyroid: No thyromegaly.  Cardiovascular:     Rate and Rhythm: Normal rate and regular rhythm.     Heart sounds: Normal heart sounds. No murmur heard. Pulmonary:     Effort: Pulmonary effort is normal. No respiratory distress.     Breath sounds: Wheezing present.  Musculoskeletal:     Cervical back: Neck supple.  Skin:    General: Skin is warm and dry.  Neurological:     Mental Status: She is alert and oriented to person, place, and time.  Psychiatric:        Mood and Affect: Mood normal.        Behavior: Behavior normal.        Thought Content: Thought content normal.        Judgment: Judgment normal.    BP 137/72 (BP Location: Right Arm, Patient Position: Sitting, Cuff Size: Large)    Pulse 71    Temp 97.7 F (36.5 C) (Oral)    Resp 16    Wt 208 lb (94.3 kg)    LMP 06/04/2017 Comment: "saw some spotting "    BMI 38.04 kg/m  Wt Readings from Last 3 Encounters:  03/27/21 208 lb (94.3 kg)  03/26/21 205 lb (93 kg)  12/03/20 198 lb (89.8 kg)       Assessment & Plan:   Problem List Items Addressed This Visit       Unprioritized   Morbid obesity ([place])    She is now working with the [facility] and is exercising regularly.       Hypertension    BP Readings from Last 3 Encounters:  03/27/21 137/72  03/26/21 131/88  12/03/20 (!) 142/81  BP is now at goal. Continue amlodipine 5mg  once daily.  Asthma    Uncontrolled. Discussed inhaled steroid vs.  Singulair. She prefers to try singulair. Continue albuterol as needed.       Relevant Medications   montelukast (SINGULAIR) 10 MG tablet   Allergic rhinitis    Uncontrolled. Recommended that she continue zyrtec, add flonase 2 sprays each nostril once daily and singulair.        I am having Hailley M. Swanger start on fluticasone and montelukast. I am also having her maintain her albuterol, estradiol, and amLODipine.  Meds ordered this encounter  Medications   fluticasone (FLONASE) 50 MCG/ACT nasal spray    Sig: Place 2 sprays into both nostrils daily.    Dispense:  16 g    Refill:  6    Order Specific Question:   Supervising Provider    Answer:   Penni Homans A [[id number]]   montelukast (SINGULAIR) 10 MG tablet    Sig: Take 1 tablet (10 mg total) by mouth at bedtime.    Dispense:  30 tablet    Refill:  3    Order Specific Question:   Supervising Provider    Answer:   Penni Homans A [[id number]]

## 2021-03-27 NOTE — Assessment & Plan Note (Signed)
Uncontrolled. Discussed inhaled steroid vs. Singulair. She prefers to try singulair. Continue albuterol as needed.

## 2021-03-27 NOTE — Patient Instructions (Signed)
Start flonase and singulair, continue zyrtec.

## 2021-03-27 NOTE — Assessment & Plan Note (Signed)
Uncontrolled. Recommended that she continue zyrtec, add flonase 2 sprays each nostril once daily and singulair.

## 2021-03-27 NOTE — Assessment & Plan Note (Signed)
BP Readings from Last 3 Encounters:  03/27/21 137/72  03/26/21 131/88  12/03/20 (!) 142/81   BP is now at goal. Continue amlodipine 5mg  once daily.

## 2021-03-27 NOTE — Assessment & Plan Note (Signed)
She is now working with the [facility] and is exercising regularly.

## 2021-03-30 ENCOUNTER — Encounter (INDEPENDENT_AMBULATORY_CARE_PROVIDER_SITE_OTHER): Payer: Self-pay | Admitting: Bariatrics

## 2021-03-30 DIAGNOSIS — R7303 Prediabetes: Secondary | ICD-10-CM | POA: Insufficient documentation

## 2021-03-30 DIAGNOSIS — E559 Vitamin D deficiency, unspecified: Secondary | ICD-10-CM | POA: Insufficient documentation

## 2021-04-09 ENCOUNTER — Encounter (INDEPENDENT_AMBULATORY_CARE_PROVIDER_SITE_OTHER): Payer: Self-pay | Admitting: Bariatrics

## 2021-04-09 ENCOUNTER — Other Ambulatory Visit: Payer: Self-pay

## 2021-04-09 ENCOUNTER — Ambulatory Visit (INDEPENDENT_AMBULATORY_CARE_PROVIDER_SITE_OTHER): Payer: 59 | Admitting: Bariatrics

## 2021-04-09 VITALS — BP 138/91 | HR 68 | Temp 98.0°F | Ht 62.0 in | Wt 200.0 lb

## 2021-04-09 DIAGNOSIS — E559 Vitamin D deficiency, unspecified: Secondary | ICD-10-CM | POA: Diagnosis not present

## 2021-04-09 DIAGNOSIS — E66812 Obesity, class 2: Secondary | ICD-10-CM

## 2021-04-09 DIAGNOSIS — E669 Obesity, unspecified: Secondary | ICD-10-CM | POA: Diagnosis not present

## 2021-04-09 DIAGNOSIS — R7303 Prediabetes: Secondary | ICD-10-CM

## 2021-04-09 DIAGNOSIS — Z6836 Body mass index (BMI) 36.0-36.9, adult: Secondary | ICD-10-CM

## 2021-04-09 MED ORDER — VITAMIN D (ERGOCALCIFEROL) 1.25 MG (50000 UNIT) PO CAPS: 50000.0000 [IU] | ORAL_CAPSULE | ORAL | 0 refills | Status: DC

## 2021-04-13 ENCOUNTER — Encounter (INDEPENDENT_AMBULATORY_CARE_PROVIDER_SITE_OTHER): Payer: Self-pay | Admitting: Bariatrics

## 2021-04-13 NOTE — Progress Notes (Signed)
? ? ? ?Chief Complaint:  ? ?OBESITY ?Amber Stephens is here to discuss her progress with her obesity treatment plan along with follow-up of her obesity related diagnoses. Amber Stephens is on the Category 2 Plan and states she is following her eating plan approximately 90% of the time. Amber Stephens states she is riding a bike for 30 minutes 1-2 times per week. ? ?Today's visit was #: 2 ?Starting weight: 205 lbs ?Starting date: 03/26/2021 ?Today's weight: 200 lbs ?Today's date: 04/09/2021 ?Total lbs lost to date: 5 lbs ?Total lbs lost since last in-office visit: 5 lbs ? ?Interim History: Amber Stephens is down 5 lbs since her initial visit. Amber Stephens stated it was hard in the beginning because she wanted to snack.  ? ?Subjective:  ? ?1. Vitamin D insufficiency ?Amber Stephens is not on Vitamin D currently. Her last Vitamin D was 25.8. ? ?2. Pre-diabetes ?Amber Stephens is not on medications. Her last A1C was 5.7. ? ?Assessment/Plan:  ? ?1. Vitamin D insufficiency ?Low Vitamin D level contributes to fatigue and are associated with obesity, breast, and colon cancer. We will refill prescription Vitamin D 50,000 IU every week for 1 month with no refills and Amber Stephens will follow-up for routine testing of Vitamin D, at least 2-3 times per year to avoid over-replacement. ? ?- Vitamin D, Ergocalciferol, (DRISDOL) 1.25 MG (50000 UNIT) CAPS capsule; Take 1 capsule (50,000 Units total) by mouth every 7 (seven) days.  Dispense: 4 capsule; Refill: 0 ? ?2. Pre-diabetes ?Amber Stephens was provided handouts for pre-diabetes and insulin resistance. She will continue to work on weight loss, exercise, and decreasing simple carbohydrates to help decrease the risk of diabetes.  ? ?3. Obesity, with current BMI of 36.6 ?Amber Stephens is currently in the action stage of change. As such, her goal is to continue with weight loss efforts. She has agreed to the Category 2 Plan.  ? ?Amber Stephens will continue meal planning. She will continue to adhere closely to the plan. We discussed labs from 03/26/2021  CMP, Lipids, Vitamin D, A1C, insulin, and thyroid panel. She will be mindful eating.  ? ?Exercise goals:  As is.  ? ?Behavioral modification strategies: increasing lean protein intake, decreasing simple carbohydrates, increasing vegetables, increasing water intake, decreasing eating out, no skipping meals, meal planning and cooking strategies, keeping healthy foods in the home, and planning for success. ? ?Amber Stephens has agreed to follow-up with our clinic in 2 weeks. She was informed of the importance of frequent follow-up visits to maximize her success with intensive lifestyle modifications for her multiple health conditions.  ? ?Objective:  ? ?Blood pressure (!) 138/91, pulse 68, temperature 98 ?F (36.7 ?C), height '5\' 2"'$  (1.575 m), weight 200 lb (90.7 kg), last menstrual period 06/04/2017, SpO2 100 %. ?Body mass index is 36.58 kg/m?. ? ?General: Cooperative, alert, well developed, in no acute distress. ?HEENT: Conjunctivae and lids unremarkable. ?Cardiovascular: Regular rhythm.  ?Lungs: Normal work of breathing. ?Neurologic: No focal deficits.  ? ?Lab Results  ?Component Value Date  ? CREATININE 0.96 03/26/2021  ? BUN 19 03/26/2021  ? NA 143 03/26/2021  ? K 4.4 03/26/2021  ? CL 104 03/26/2021  ? CO2 23 03/26/2021  ? ?Lab Results  ?Component Value Date  ? ALT 20 03/26/2021  ? AST 21 03/26/2021  ? ALKPHOS 59 03/26/2021  ? BILITOT <0.2 03/26/2021  ? ?Lab Results  ?Component Value Date  ? HGBA1C 5.7 (H) 03/26/2021  ? ?Lab Results  ?Component Value Date  ? INSULIN 19.1 03/26/2021  ? ?Lab Results  ?Component Value Date  ?  TSH 0.913 03/26/2021  ? ?Lab Results  ?Component Value Date  ? CHOL 183 03/26/2021  ? HDL 55 03/26/2021  ? LDLCALC 115 (H) 03/26/2021  ? TRIG 69 03/26/2021  ? CHOLHDL 3 05/28/2020  ? ?Lab Results  ?Component Value Date  ? VD25OH 25.8 (L) 03/26/2021  ? ?Lab Results  ?Component Value Date  ? WBC 5.1 04/03/2019  ? HGB 13.1 04/03/2019  ? HCT 39.0 04/03/2019  ? MCV 89.6 04/03/2019  ? PLT 332.0 04/03/2019   ? ?Lab Results  ?Component Value Date  ? IRON 106 10/30/2012  ? ? ?Attestation Statements:  ? ?Reviewed by clinician on day of visit: allergies, medications, problem list, medical history, surgical history, family history, social history, and previous encounter notes. ? ?I, Lizbeth Bark, RMA, am acting as transcriptionist for [employer], DO. ? ?I have reviewed the above documentation for accuracy and completeness, and I agree with the above. Jearld Lesch, DO ? ?

## 2021-04-29 ENCOUNTER — Other Ambulatory Visit: Payer: Self-pay

## 2021-04-29 ENCOUNTER — Encounter (INDEPENDENT_AMBULATORY_CARE_PROVIDER_SITE_OTHER): Payer: Self-pay | Admitting: Nurse Practitioner

## 2021-04-29 ENCOUNTER — Ambulatory Visit (INDEPENDENT_AMBULATORY_CARE_PROVIDER_SITE_OTHER): Payer: 59 | Admitting: Nurse Practitioner

## 2021-04-29 VITALS — BP 127/81 | HR 59 | Temp 98.0°F | Ht 62.0 in | Wt 194.0 lb

## 2021-04-29 DIAGNOSIS — Z9189 Other specified personal risk factors, not elsewhere classified: Secondary | ICD-10-CM

## 2021-04-29 DIAGNOSIS — Z6837 Body mass index (BMI) 37.0-37.9, adult: Secondary | ICD-10-CM

## 2021-04-29 DIAGNOSIS — E559 Vitamin D deficiency, unspecified: Secondary | ICD-10-CM

## 2021-04-29 DIAGNOSIS — R7303 Prediabetes: Secondary | ICD-10-CM | POA: Diagnosis not present

## 2021-04-29 DIAGNOSIS — E669 Obesity, unspecified: Secondary | ICD-10-CM

## 2021-04-29 DIAGNOSIS — I10 Essential (primary) hypertension: Secondary | ICD-10-CM

## 2021-04-29 DIAGNOSIS — Z6835 Body mass index (BMI) 35.0-35.9, adult: Secondary | ICD-10-CM

## 2021-04-29 MED ORDER — VITAMIN D (ERGOCALCIFEROL) 1.25 MG (50000 UNIT) PO CAPS: 50000.0000 [IU] | ORAL_CAPSULE | ORAL | 0 refills | Status: DC

## 2021-04-29 NOTE — Progress Notes (Signed)
? ? ? ?Chief Complaint:  ? ?OBESITY ?Amber Stephens is here to discuss her progress with her obesity treatment plan along with follow-up of her obesity related diagnoses. Amber Stephens is on the Category 2 Plan and states she is following her eating plan approximately 95% of the time. Amber Stephens states she is using a stationary bike for 30 minutes 2-3 times per week. ? ?Today's visit was #: 3 ?Starting weight: 205 lbs ?Starting date: 03/26/2021 ?Today's weight: 194 lbs ?Today's date: 04/29/2021 ?Total lbs lost to date: 11 lbs ?Total lbs lost since last in-office visit: 6 lbs ? ?Interim History: Amber Stephens done well with weight loss since last visit. She is meeting calories and protein goals. She notes some hunger in the afternoon. She is drinking water daily.  ? ?Subjective:  ? ?1. Vitamin D insufficiency ?Amber Stephens is taking Vitamin D 50,000 IU weekly. She denies side effects nausea, vomiting and muscle weakness. Her last Vitamin D was 25.8. ? ?2. Pre-diabetes ?Amber Stephens's last A1C was 5.7 and fasting insulin was 19.1. She is not on medications.  ? ?3. Essential hypertension ?Amber Stephens is currently taking Norvasc 5 mg. She denies side effects. She denies chest pains, shortness of breath or palpitations. Her goal is to stop Norvasc.  ? ?4. At risk for osteoporosis ?Amber Stephens is at higher risk of osteopenia and osteoporosis due to Vitamin D deficiency.   ? ?Assessment/Plan:  ? ?1. Vitamin D insufficiency ?Low Vitamin D level contributes to fatigue and are associated with obesity, breast, and colon cancer. We will refill prescription Vitamin D 50,000 IU every week for 1 month with no refills and Amber Stephens will follow-up for routine testing of Vitamin D, at least 2-3 times per year to avoid over-replacement. ? ?- Vitamin D, Ergocalciferol, (DRISDOL) 1.25 MG (50000 UNIT) CAPS capsule; Take 1 capsule (50,000 Units total) by mouth every 7 (seven) days.  Dispense: 4 capsule; Refill: 0 ? ?2. Pre-diabetes ?Amber Stephens will continue to work on weight loss,  exercise, and decreasing simple carbohydrates to help decrease the risk of diabetes.  ? ?3. Essential hypertension ?Narmeen will continue to follow up with her primary care provider. She will continue medications as directed. She is working on healthy weight loss and exercise to improve blood pressure control. We will watch for signs of hypotension as she continues her lifestyle modifications. ? ?4. At risk for osteoporosis ?Amber Stephens was given approximately 15 minutes of osteoporosis prevention counseling today. Amber Stephens is at risk for osteopenia and osteoporosis due to her Vitamin D deficiency. She was encouraged to take her Vit D and follow her higher calcium diet and increase strengthening exercise to help strengthen her bones and decrease her risk of osteopenia and osteoporosis.  ? ?5. Obesity, with current BMI of 35.6 ?Amber Stephens is currently in the action stage of change. As such, her goal is to continue with weight loss efforts. She has agreed to the Category 2 Plan.  ? ?Exercise goals:  As is. We discussed  [employer].  ? ?Behavioral modification strategies: increasing lean protein intake, increasing water intake, no skipping meals, and meal planning and cooking strategies. ? ?Amber Stephens has agreed to follow-up with our clinic in 2 weeks. She was informed of the importance of frequent follow-up visits to maximize her success with intensive lifestyle modifications for her multiple health conditions.  ? ?Objective:  ? ?Blood pressure 127/81, pulse (!) 59, temperature 98 ?F (36.7 ?C), height '5\' 2"'$  (1.575 m), weight 194 lb (88 kg), last menstrual period 06/04/2017, SpO2 98 %. ?Body mass index is  35.48 kg/m?. ? ?General: Cooperative, alert, well developed, in no acute distress. ?HEENT: Conjunctivae and lids unremarkable. ?Cardiovascular: Regular rhythm.  ?Lungs: Normal work of breathing. ?Neurologic: No focal deficits.  ? ?Lab Results  ?Component Value Date  ? CREATININE 0.96 03/26/2021  ? BUN 19 03/26/2021  ? NA 143  03/26/2021  ? K 4.4 03/26/2021  ? CL 104 03/26/2021  ? CO2 23 03/26/2021  ? ?Lab Results  ?Component Value Date  ? ALT 20 03/26/2021  ? AST 21 03/26/2021  ? ALKPHOS 59 03/26/2021  ? BILITOT <0.2 03/26/2021  ? ?Lab Results  ?Component Value Date  ? HGBA1C 5.7 (H) 03/26/2021  ? ?Lab Results  ?Component Value Date  ? INSULIN 19.1 03/26/2021  ? ?Lab Results  ?Component Value Date  ? TSH 0.913 03/26/2021  ? ?Lab Results  ?Component Value Date  ? CHOL 183 03/26/2021  ? HDL 55 03/26/2021  ? LDLCALC 115 (H) 03/26/2021  ? TRIG 69 03/26/2021  ? CHOLHDL 3 05/28/2020  ? ?Lab Results  ?Component Value Date  ? VD25OH 25.8 (L) 03/26/2021  ? ?Lab Results  ?Component Value Date  ? WBC 5.1 04/03/2019  ? HGB 13.1 04/03/2019  ? HCT 39.0 04/03/2019  ? MCV 89.6 04/03/2019  ? PLT 332.0 04/03/2019  ? ?Lab Results  ?Component Value Date  ? IRON 106 10/30/2012  ? ?Attestation Statements:  ? ?Reviewed by clinician on day of visit: allergies, medications, problem list, medical history, surgical history, family history, social history, and previous encounter notes. ? ?I, Lizbeth Bark, RMA, am acting as Location manager for [employer], FNP.  ? ?I have reviewed the above documentation for accuracy and completeness, and I agree with the above. Everardo Pacific, FNP  ?

## 2021-05-13 ENCOUNTER — Ambulatory Visit (INDEPENDENT_AMBULATORY_CARE_PROVIDER_SITE_OTHER): Payer: 59 | Admitting: Nurse Practitioner

## 2021-05-13 ENCOUNTER — Encounter (INDEPENDENT_AMBULATORY_CARE_PROVIDER_SITE_OTHER): Payer: Self-pay | Admitting: Nurse Practitioner

## 2021-05-13 VITALS — BP 117/81 | HR 68 | Temp 97.7°F | Ht 62.0 in | Wt 192.0 lb

## 2021-05-13 DIAGNOSIS — E669 Obesity, unspecified: Secondary | ICD-10-CM | POA: Diagnosis not present

## 2021-05-13 DIAGNOSIS — I10 Essential (primary) hypertension: Secondary | ICD-10-CM | POA: Diagnosis not present

## 2021-05-13 DIAGNOSIS — Z6835 Body mass index (BMI) 35.0-35.9, adult: Secondary | ICD-10-CM

## 2021-05-21 NOTE — Progress Notes (Signed)
? ? ? ?Chief Complaint:  ? ?OBESITY ?Amber Stephens is here to discuss her progress with her obesity treatment plan along with follow-up of her obesity related diagnoses. Amber Stephens is on the Category 2 Plan and states she is following her eating plan approximately 90-95% of the time. Amber Stephens states she is using a stationary bike and walking for  30-40 minutes 3 times per week. ? ?Today's visit was #: 4 ?Starting weight: 205 lbs ?Starting date: 03/26/2021 ?Today's weight: 192 lbs ?Today's date: 05/13/2021 ?Total lbs lost to date: 13 lbs ?Total lbs lost since last in-office visit: 2 lbs ? ?Interim History: Amber Stephens has overall done well with weight loss. She celebrated Easter since her last visit. She is doing well with breakfast and lunch. She struggles with choices for dinner. She is drinking water daily and sometimes a protein shake for breakfast.  ? ?Subjective:  ? ?1. Essential hypertension ?Amber Stephens is currently taking Norvasc 5 mg. She denies side effects chest pains, shortness of breath and palpitations.  ? ?Assessment/Plan:  ? ?1. Essential hypertension ?Amber Stephens will continue to follow up with primary care provider. She will continue with medications as directed. She is working on healthy weight loss and exercise to improve blood pressure control. We will watch for signs of hypotension as she continues her lifestyle modifications. ? ?2. Obesity, with current BMI of 35.2 ?Amber Stephens is currently in the action stage of change. As such, her goal is to continue with weight loss efforts. She has agreed to the Category 2 Plan.  ? ?Exercise goals:  As is. Amber Stephens plans to start swimming.  ? ?Behavioral modification strategies: increasing lean protein intake and increasing water intake. ? ?Amber Stephens has agreed to follow-up with our clinic in 2 weeks. She was informed of the importance of frequent follow-up visits to maximize her success with intensive lifestyle modifications for her multiple health conditions.  ? ?Objective:   ? ?Blood pressure 117/81, pulse 68, temperature 97.7 ?F (36.5 ?C), height '5\' 2"'$  (1.575 m), weight 192 lb (87.1 kg), last menstrual period 06/04/2017, SpO2 99 %. ?Body mass index is 35.12 kg/m?. ? ?General: Cooperative, alert, well developed, in no acute distress. ?HEENT: Conjunctivae and lids unremarkable. ?Cardiovascular: Regular rhythm.  ?Lungs: Normal work of breathing. ?Neurologic: No focal deficits.  ? ?Lab Results  ?Component Value Date  ? CREATININE 0.96 03/26/2021  ? BUN 19 03/26/2021  ? NA 143 03/26/2021  ? K 4.4 03/26/2021  ? CL 104 03/26/2021  ? CO2 23 03/26/2021  ? ?Lab Results  ?Component Value Date  ? ALT 20 03/26/2021  ? AST 21 03/26/2021  ? ALKPHOS 59 03/26/2021  ? BILITOT <0.2 03/26/2021  ? ?Lab Results  ?Component Value Date  ? HGBA1C 5.7 (H) 03/26/2021  ? ?Lab Results  ?Component Value Date  ? INSULIN 19.1 03/26/2021  ? ?Lab Results  ?Component Value Date  ? TSH 0.913 03/26/2021  ? ?Lab Results  ?Component Value Date  ? CHOL 183 03/26/2021  ? HDL 55 03/26/2021  ? LDLCALC 115 (H) 03/26/2021  ? TRIG 69 03/26/2021  ? CHOLHDL 3 05/28/2020  ? ?Lab Results  ?Component Value Date  ? VD25OH 25.8 (L) 03/26/2021  ? ?Lab Results  ?Component Value Date  ? WBC 5.1 04/03/2019  ? HGB 13.1 04/03/2019  ? HCT 39.0 04/03/2019  ? MCV 89.6 04/03/2019  ? PLT 332.0 04/03/2019  ? ?Lab Results  ?Component Value Date  ? IRON 106 10/30/2012  ? ? ?Attestation Statements:  ? ?Reviewed by clinician on day of  visit: allergies, medications, problem list, medical history, surgical history, family history, social history, and previous encounter notes. ? ?Time spent on visit including pre-visit chart review and post-visit care and charting was 30 minutes.  ? ?I, Lizbeth Bark, RMA, am acting as Location manager for Everardo Pacific, FNP. ? ?I have reviewed the above documentation for accuracy and completeness, and I agree with the above. Everardo Pacific, FNP  ?

## 2021-06-02 NOTE — Progress Notes (Signed)
?TeleHealth Visit:  ?This visit was completed with telemedicine (audio/video) technology. ?[name] has verbally consented to this TeleHealth visit. The patient is located at home, the provider is located at home. The participants in this visit include the listed provider and patient. The visit was conducted today via MyChart video. ? ?OBESITY ?Amber Stephens is here to discuss her progress with her obesity treatment plan along with follow-up of her obesity related diagnoses.  ? ?Today's visit was # 5 ?Starting weight: 205 lbs ?Starting date: 03/26/2021 ?Weight at last in office visit: 192 lbs on 05/13/21 ?Total weight loss: 13 lbs at last in office visit on 05/13/21. ?Today's reported weight: 189.2 lbs. She weighs daily. ? ?Nutrition Plan: the Category 2 Plan.  ?Hunger is well controlled when eating on the plan. Cravings are moderately controlled.  ?Current exercise:  gym sporadically (1-2 times per week). Bike at home 45 minutes.  ? ?Interim History: Amber Stephens is doing an excellent job with the meal plan.  She has lost another 3 pounds according to her scales.  She notes hunger is well controlled but she does tend to have sweet cravings when she fixes dessert for her family. ?She is doing a combination of riding her stationary bike at home and going to the gym. ?She has recently increased her water intake to 10 to 11 cups/day. ? ?She is unsure if she wants to alternate virtual visits because she likes to weigh in the office. ?Assessment/Plan:  ?1. Vitamin D Deficiency ?Vitamin D is not at goal of 50. She is on weekly prescription Vitamin D 50,000 IU.  ?Lab Results  ?Component Value Date  ? VD25OH 25.8 (L) 03/26/2021  ? ? ?Plan: ?Refill prescription vitamin D 50,000 IU weekly. ? ?2. Prediabetes ?Amber Stephens has a diagnosis of prediabetes based on her elevated HgA1c. ?She denies polyphagia. ?Medication(s): none ?Lab Results  ?Component Value Date  ? HGBA1C 5.7 (H) 03/26/2021  ? ?Lab Results  ?Component Value Date  ?  INSULIN 19.1 03/26/2021  ? ? ?Plan: ?Continue to limit simple carbohydrates. ? ?3. Obesity: Current BMI 35.1 ?Amber Stephens is currently in the action stage of change. As such, her goal is to continue with weight loss efforts.  ?She has agreed to the Category 2 Plan and keeping a food journal OR ?adhering to recommended goals of 1200 calories and 85 gms protein.  ? ?Exercise goals:  Continue current regimen of walking and going to the gym.  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 ? ?Behavioral modification strategies: better snacking choices, travel eating strategies, and keeping a strict food journal. ? ?Maurisa has agreed to follow-up with our clinic in 3 weeks.  ? ?No orders of the defined types were placed in this encounter. ? ? ?Medications Discontinued During This Encounter  ?Medication Reason  ? Vitamin D, Ergocalciferol, (DRISDOL) 1.25 MG (50000 UNIT) CAPS capsule Reorder  ?  ? ?Meds ordered this encounter  ?Medications  ? Vitamin D, Ergocalciferol, (DRISDOL) 1.25 MG (50000 UNIT) CAPS capsule  ?  Sig: Take 1 capsule (50,000 Units total) by mouth every 7 (seven) days.  ?  Dispense:  4 capsule  ?  Refill:  0  ?  Order Specific Question:   Supervising Provider  ?  Answer:   Dennard Nip D [[id number]]  ?   ? ?Objective:  ? ?VITALS: Per patient if applicable, see vitals. ?GENERAL: Alert and in no acute  distress. ?CARDIOPULMONARY: No increased WOB. Speaking in clear sentences.  ?PSYCH: Pleasant and cooperative. Speech normal rate and rhythm. Affect is appropriate. Insight and judgement are appropriate. Attention is focused, linear, and appropriate.  ?NEURO: Oriented as arrived to appointment on time with no prompting.  ? ?Lab Results  ?Component Value Date  ? CREATININE 0.96 03/26/2021  ? BUN 19 03/26/2021  ? NA 143 03/26/2021  ? K 4.4 03/26/2021  ? CL 104 03/26/2021  ? CO2 23 03/26/2021  ? ?Lab  Results  ?Component Value Date  ? ALT 20 03/26/2021  ? AST 21 03/26/2021  ? ALKPHOS 59 03/26/2021  ? BILITOT <0.2 03/26/2021  ? ?Lab Results  ?Component Value Date  ? HGBA1C 5.7 (H) 03/26/2021  ? ?Lab Results  ?Component Value Date  ? INSULIN 19.1 03/26/2021  ? ?Lab Results  ?Component Value Date  ? TSH 0.913 03/26/2021  ? ?Lab Results  ?Component Value Date  ? CHOL 183 03/26/2021  ? HDL 55 03/26/2021  ? LDLCALC 115 (H) 03/26/2021  ? TRIG 69 03/26/2021  ? CHOLHDL 3 05/28/2020  ? ?Lab Results  ?Component Value Date  ? WBC 5.1 04/03/2019  ? HGB 13.1 04/03/2019  ? HCT 39.0 04/03/2019  ? MCV 89.6 04/03/2019  ? PLT 332.0 04/03/2019  ? ?Lab Results  ?Component Value Date  ? IRON 106 10/30/2012  ? ?Lab Results  ?Component Value Date  ? VD25OH 25.8 (L) 03/26/2021  ? ? ?Attestation Statements:  ? ?Reviewed by clinician on day of visit: allergies, medications, problem list, medical history, surgical history, family history, social history, and previous encounter notes. ? ? ?

## 2021-06-03 ENCOUNTER — Telehealth (INDEPENDENT_AMBULATORY_CARE_PROVIDER_SITE_OTHER): Payer: 59 | Admitting: Family Medicine

## 2021-06-03 ENCOUNTER — Encounter (INDEPENDENT_AMBULATORY_CARE_PROVIDER_SITE_OTHER): Payer: Self-pay | Admitting: Family Medicine

## 2021-06-03 DIAGNOSIS — E559 Vitamin D deficiency, unspecified: Secondary | ICD-10-CM | POA: Diagnosis not present

## 2021-06-03 DIAGNOSIS — E669 Obesity, unspecified: Secondary | ICD-10-CM | POA: Diagnosis not present

## 2021-06-03 DIAGNOSIS — R7303 Prediabetes: Secondary | ICD-10-CM | POA: Diagnosis not present

## 2021-06-03 DIAGNOSIS — Z6835 Body mass index (BMI) 35.0-35.9, adult: Secondary | ICD-10-CM

## 2021-06-03 DIAGNOSIS — E66812 Obesity, class 2: Secondary | ICD-10-CM

## 2021-06-03 MED ORDER — VITAMIN D (ERGOCALCIFEROL) 1.25 MG (50000 UNIT) PO CAPS: 50000.0000 [IU] | ORAL_CAPSULE | ORAL | 0 refills | Status: DC

## 2021-06-06 ENCOUNTER — Other Ambulatory Visit: Payer: Self-pay | Admitting: Family

## 2021-06-22 ENCOUNTER — Other Ambulatory Visit: Payer: Self-pay | Admitting: Family

## 2021-06-23 ENCOUNTER — Encounter (INDEPENDENT_AMBULATORY_CARE_PROVIDER_SITE_OTHER): Payer: Self-pay | Admitting: Nurse Practitioner

## 2021-06-23 ENCOUNTER — Ambulatory Visit (INDEPENDENT_AMBULATORY_CARE_PROVIDER_SITE_OTHER): Payer: 59 | Admitting: Nurse Practitioner

## 2021-06-23 VITALS — BP 123/85 | HR 66 | Temp 97.5°F | Ht 62.0 in | Wt 189.0 lb

## 2021-06-23 DIAGNOSIS — E559 Vitamin D deficiency, unspecified: Secondary | ICD-10-CM

## 2021-06-23 DIAGNOSIS — Z6834 Body mass index (BMI) 34.0-34.9, adult: Secondary | ICD-10-CM

## 2021-06-23 DIAGNOSIS — E669 Obesity, unspecified: Secondary | ICD-10-CM | POA: Diagnosis not present

## 2021-06-23 DIAGNOSIS — E66812 Obesity, class 2: Secondary | ICD-10-CM

## 2021-06-23 MED ORDER — VITAMIN D (ERGOCALCIFEROL) 1.25 MG (50000 UNIT) PO CAPS: 50000.0000 [IU] | ORAL_CAPSULE | ORAL | 0 refills | Status: DC

## 2021-06-24 ENCOUNTER — Ambulatory Visit (INDEPENDENT_AMBULATORY_CARE_PROVIDER_SITE_OTHER): Payer: 59 | Admitting: Family

## 2021-06-24 DIAGNOSIS — J4520 Mild intermittent asthma, uncomplicated: Secondary | ICD-10-CM

## 2021-06-24 DIAGNOSIS — I10 Essential (primary) hypertension: Secondary | ICD-10-CM | POA: Diagnosis not present

## 2021-06-24 DIAGNOSIS — J301 Allergic rhinitis due to pollen: Secondary | ICD-10-CM

## 2021-06-24 MED ORDER — AMLODIPINE BESYLATE 5 MG PO TABS: 5.0000 mg | ORAL_TABLET | Freq: Every day | ORAL | 0 refills | Status: DC

## 2021-06-24 NOTE — Assessment & Plan Note (Addendum)
Wt Readings from Last 3 Encounters:  06/24/21 194 lb (88 kg)  06/23/21 189 lb (85.7 kg)  05/13/21 192 lb (87.1 kg)   She continues to work closely with [facility].

## 2021-06-24 NOTE — Assessment & Plan Note (Signed)
Stable on singulair.  

## 2021-06-24 NOTE — Progress Notes (Signed)
Subjective:     Patient ID: Amber Stephens, female    DOB: 11-13-1975, 46 y.o.   MRN: [id number]  Chief Complaint  Patient presents with   Hypertension    Here for follow up    Hypertension  Patient is in today for follow up.  HTN- continues amlodipine '5mg'$ .  She does have some mild LE edema.   Asthma- she is taking singulair.  Not needing albuterol.   Health Maintenance Due  Topic Date Due   HIV Screening  Never done   Hepatitis C Screening  Never done   PAP SMEAR-Modifier  07/15/2017   COVID-19 Vaccine (4 - Booster for Pfizer series) 03/24/2020    Past Medical History:  Diagnosis Date   Allergic rhinitis    Anemia    borderline, prior to hysterectomy   Asthma    Fatigue    History of pneumonia age 18 and agin   my mom said at age 22 and again at age 29    Hypertension 05/28/2020   Multiple food allergies    Shortness of breath    Shortness of breath on exertion     Past Surgical History:  Procedure Laterality Date   ABDOMINAL HYSTERECTOMY N/A 06/16/2017   Procedure: HYSTERECTOMY ABDOMINAL TOTAL;  Surgeon: Maisie Fus, MD;  Location: [facility];  Service: Gynecology;  Laterality: N/A;   BILATERAL SALPINGECTOMY Bilateral 06/16/2017   Procedure: BILATERAL SALPINGECTOMY;  Surgeon: Maisie Fus, MD;  Location: [facility];  Service: Gynecology;  Laterality: Bilateral;   BTL  11/2008   Dr Corinna Capra; bilateral tubal ligation    CESAREAN SECTION     x4 , with each child , has 4 children    DILATION AND CURETTAGE OF UTERUS  07/02/2012   MYOMECTOMY  2001   Dr Evette Cristal at [place] History  Problem Relation Age of Onset   Alzheimer's disease Mother        died at 80 (diagnosed at 22)   Stroke Mother    Hypertension Father    Stroke Maternal Grandmother    Diabetes Maternal Grandmother    Diabetes Maternal Grandfather        (had bilateral amputation and blindness)   Colon cancer Maternal Grandfather 16   AAA  (abdominal aortic aneurysm) Maternal Grandfather    Stomach cancer Neg Hx    Esophageal cancer Neg Hx     Social History   Socioeconomic History   Marital status: Married    Spouse name: marcus   Number of children: 4   Years of education: Not on file   Highest education level: Not on file  Occupational History   Occupation: Economist: [employer]  Tobacco Use   Smoking status: Never   Smokeless tobacco: Never  Vaping Use   Vaping Use: Never used  Substance and Sexual Activity   Alcohol use: Never   Drug use: No   Sexual activity: Yes    Partners: Male  Other Topics Concern   Not on file  Social History Narrative   4 kids (G4P3)      2002- son, Adline Mango.   2003Laurence Ferrari   [id number]   [id number]- [name]      Academic records Freight forwarder at [employer]      Also teaches Medical reception at [employer]      Enjoys reading, shopping, cooking      Married 21 years  Social Determinants of Health   Financial Resource Strain: Not on file  Food Insecurity: Not on file  Transportation Needs: Not on file  Physical Activity: Not on file  Stress: Not on file  Social Connections: Not on file  Intimate Partner Violence: Not on file    Outpatient Medications Prior to Visit  Medication Sig Dispense Refill   albuterol (PROAIR HFA) 108 (90 Base) MCG/ACT inhaler USE 1-2 PUFFS BY MOUTH EVERY 4 HOURS AS NEEDED wheezing and shortness of breath 3 Inhaler 6   estradiol (VIVELLE-DOT) 0.1 MG/24HR patch Place 1 patch onto the skin 2 (two) times a week.     montelukast (SINGULAIR) 10 MG tablet TAKE 1 TABLET BY MOUTH EVERYDAY AT BEDTIME 90 tablet 1   Vitamin D, Ergocalciferol, (DRISDOL) 1.25 MG (50000 UNIT) CAPS capsule Take 1 capsule (50,000 Units total) by mouth every 7 (seven) days. 4 capsule 0   amLODipine (NORVASC) 5 MG tablet TAKE 1 TABLET BY MOUTH EVERY DAY 90 tablet 0   fluticasone (FLONASE) 50 MCG/ACT nasal spray Place 2 sprays into both nostrils  daily. 16 g 6   No facility-administered medications prior to visit.    Allergies  Allergen Reactions   Other     Almonds    ROS See HPI    Objective:    Physical Exam Constitutional:      General: She is not in acute distress.    Appearance: Normal appearance. She is well-developed.  HENT:     Head: Normocephalic and atraumatic.     Right Ear: External ear normal.     Left Ear: External ear normal.  Eyes:     General: No scleral icterus. Neck:     Thyroid: No thyromegaly.  Cardiovascular:     Rate and Rhythm: Normal rate and regular rhythm.     Heart sounds: Normal heart sounds. No murmur heard. Pulmonary:     Effort: Pulmonary effort is normal. No respiratory distress.     Breath sounds: Normal breath sounds. No wheezing.  Musculoskeletal:     Cervical back: Neck supple.  Skin:    General: Skin is warm and dry.  Neurological:     Mental Status: She is alert and oriented to person, place, and time.  Psychiatric:        Mood and Affect: Mood normal.        Behavior: Behavior normal.        Thought Content: Thought content normal.        Judgment: Judgment normal.    BP 138/78 (BP Location: Right Arm, Patient Position: Sitting, Cuff Size: Large)   Pulse 67   Temp 97.8 F (36.6 C) (Oral)   Resp 16   Wt 194 lb (88 kg)   LMP 06/04/2017 Comment: "saw some spotting "   SpO2 100%   BMI 35.48 kg/m  Wt Readings from Last 3 Encounters:  06/24/21 194 lb (88 kg)  06/23/21 189 lb (85.7 kg)  05/13/21 192 lb (87.1 kg)       Assessment & Plan:   Problem List Items Addressed This Visit       Unprioritized   Morbid obesity ([place])    Wt Readings from Last 3 Encounters:  06/24/21 194 lb (88 kg)  06/23/21 189 lb (85.7 kg)  05/13/21 192 lb (87.1 kg)  She continues to work closely with the [facility].       Hypertension    BP Readings from Last 3 Encounters:  06/24/21 138/78  06/23/21 123/85  05/13/21 117/81  BP is acceptable.  Continue amlodipine '5mg'$  once daily.      Relevant Medications   amLODipine (NORVASC) 5 MG tablet   Asthma    Stable on singulair.        Allergic rhinitis    Stable with singulair.         I have discontinued Marianita M. Rohrig's fluticasone. I have also changed her amLODipine. Additionally, I am having her maintain her albuterol, estradiol, montelukast, and Vitamin D (Ergocalciferol).  Meds ordered this encounter  Medications   amLODipine (NORVASC) 5 MG tablet    Sig: Take 1 tablet (5 mg total) by mouth daily.    Dispense:  90 tablet    Refill:  0    Order Specific Question:   Supervising Provider    Answer:   Penni Homans A [[id number]]

## 2021-06-24 NOTE — Assessment & Plan Note (Signed)
Stable with singulair.

## 2021-06-24 NOTE — Assessment & Plan Note (Addendum)
BP Readings from Last 3 Encounters:  06/24/21 138/78  06/23/21 123/85  05/13/21 117/81   BP is acceptable. Continue amlodipine '5mg'$  once daily.

## 2021-07-01 NOTE — Progress Notes (Signed)
Chief Complaint:   OBESITY Amber Stephens is here to discuss her progress with her obesity treatment plan along with follow-up of her obesity related diagnoses. Amber Stephens is on the Category 2 Plan and keeping a food journal and adhering to recommended goals of 1200 calories and 85 grams protein and states she is following her eating plan approximately 80% of the time. Amber Stephens states she is going to the gym and walking 30-45 minutes 4 times per week.  Today's visit was #: 6 Starting weight: 205 lbs Starting date: 03/26/2021 Today's weight: 189 lbs Today's date: 06/23/2021 Total lbs lost to date: 16 Total lbs lost since last in-office visit: 3  Interim History: Amber Stephens has done well with weight loss. She is going to [employer] and is doing weight training 2 days a week. Pt plans to increase her weight lifting days. She notes that her clothes are fitting better.  Subjective:   1. Vitamin D insufficiency Pt's last Vit D level was 25.8. She is currently taking prescription vitamin D 50,000 IU each week. She denies nausea, vomiting or muscle weakness.   Assessment/Plan:   1. Vitamin D insufficiency Low Vitamin D level contributes to fatigue and are associated with obesity, breast, and colon cancer. She agrees to continue to take prescription Vitamin D '@50'$ ,000 IU every week and will follow-up for routine testing of Vitamin D, at least 2-3 times per year to avoid over-replacement.  Low Vitamin D level contributes to fatigue and are associated with obesity, breast, and colon cancer. She agrees to continue to take prescription Vitamin D '@50'$ ,000 IU every week and will follow-up for routine testing of Vitamin D, at least 2-3 times per year to avoid over-replacement.  Refill- Vitamin D, Ergocalciferol, (DRISDOL) 1.25 MG (50000 UNIT) CAPS capsule; Take 1 capsule (50,000 Units total) by mouth every 7 (seven) days.  Dispense: 4 capsule; Refill: 0  2. Obesity, with current BMI of 34.6 Amber Stephens is  currently in the action stage of change. As such, her goal is to continue with weight loss efforts. She has agreed to the Category 2 Plan.   Exercise goals:  As is  Behavioral modification strategies: increasing lean protein intake, increasing water intake, meal planning and cooking strategies, and planning for success.  Amber Stephens has agreed to follow-up with our clinic in 3 weeks. She was informed of the importance of frequent follow-up visits to maximize her success with intensive lifestyle modifications for her multiple health conditions.   Objective:   Blood pressure 123/85, pulse 66, temperature (!) 97.5 F (36.4 C), height '5\' 2"'$  (1.575 m), weight 189 lb (85.7 kg), last menstrual period 06/04/2017, SpO2 100 %. Body mass index is 34.57 kg/m.  General: Cooperative, alert, well developed, in no acute distress. HEENT: Conjunctivae and lids unremarkable. Cardiovascular: Regular rhythm.  Lungs: Normal work of breathing. Neurologic: No focal deficits.   Lab Results  Component Value Date   CREATININE 0.96 03/26/2021   BUN 19 03/26/2021   NA 143 03/26/2021   K 4.4 03/26/2021   CL 104 03/26/2021   CO2 23 03/26/2021   Lab Results  Component Value Date   ALT 20 03/26/2021   AST 21 03/26/2021   ALKPHOS 59 03/26/2021   BILITOT <0.2 03/26/2021   Lab Results  Component Value Date   HGBA1C 5.7 (H) 03/26/2021   Lab Results  Component Value Date   INSULIN 19.1 03/26/2021   Lab Results  Component Value Date   TSH 0.913 03/26/2021   Lab Results  Component Value Date   CHOL 183 03/26/2021   HDL 55 03/26/2021   LDLCALC 115 (H) 03/26/2021   TRIG 69 03/26/2021   CHOLHDL 3 05/28/2020   Lab Results  Component Value Date   VD25OH 25.8 (L) 03/26/2021   Lab Results  Component Value Date   WBC 5.1 04/03/2019   HGB 13.1 04/03/2019   HCT 39.0 04/03/2019   MCV 89.6 04/03/2019   PLT 332.0 04/03/2019   Lab Results  Component Value Date   IRON 106 10/30/2012    Attestation  Statements:   Reviewed by clinician on day of visit: allergies, medications, problem list, medical history, surgical history, family history, social history, and previous encounter notes.  I, Kathlene November, BS, CMA, am acting as transcriptionist for Everardo Pacific, FNP.  I have reviewed the above documentation for accuracy and completeness, and I agree with the above. Everardo Pacific, FNP

## 2021-07-14 ENCOUNTER — Ambulatory Visit (INDEPENDENT_AMBULATORY_CARE_PROVIDER_SITE_OTHER): Payer: 59 | Admitting: Nurse Practitioner

## 2021-07-14 ENCOUNTER — Encounter (INDEPENDENT_AMBULATORY_CARE_PROVIDER_SITE_OTHER): Payer: Self-pay | Admitting: Nurse Practitioner

## 2021-07-14 VITALS — BP 122/82 | HR 68 | Temp 97.3°F | Ht 62.0 in | Wt 189.0 lb

## 2021-07-14 DIAGNOSIS — Z6834 Body mass index (BMI) 34.0-34.9, adult: Secondary | ICD-10-CM

## 2021-07-14 DIAGNOSIS — E669 Obesity, unspecified: Secondary | ICD-10-CM | POA: Diagnosis not present

## 2021-07-14 DIAGNOSIS — E559 Vitamin D deficiency, unspecified: Secondary | ICD-10-CM

## 2021-07-14 DIAGNOSIS — R7303 Prediabetes: Secondary | ICD-10-CM

## 2021-07-15 NOTE — Progress Notes (Signed)
Chief Complaint:   OBESITY Amber Stephens is here to discuss her progress with her obesity treatment plan along with follow-up of her obesity related diagnoses. [name] is on the Category 2 Plan and states she is following her eating plan approximately 70% of the time. [name] states she is walking 30-45 minutes 2 times per week.  Today's visit was #: 7 Starting weight: 205 lbs Starting date: 03/26/2021 Today's weight: 189 lbs Today's date: 07/14/2021 Total lbs lost to date: 16 lbs Total lbs lost since last in-office visit: 0  Interim History: [name] has some celebrations and traveling since her last visit. She has gotten off track. Has not been to the gym over the past 1-2 weeks. She is not traveling over the next several weeks. She is not drinking enough water.   Subjective:   1. Vitamin D insufficiency [name] is currently taking prescription Vit D 50,000 IU once a week. Notes some nausea over the past 2 weeks.  2. Pre-diabetes Solace last A1c was 5.7. She is not currently taking medication. She is struggling with cravings and hunger.   Assessment/Plan:   1. Vitamin D insufficiency Amber Stephens will take Vit D every other week to see if that helps with nausea until next visit.  Low Vitamin D level contributes to fatigue and are associated with obesity, breast, and colon cancer. She agrees to continue to take prescription Vitamin D '@50'$ ,000 IU every week and will follow-up for routine testing of Vitamin D, at least 2-3 times per year to avoid over-replacement.   2. Pre-diabetes Handouts given for insulin resistance and Metformin. We will obtain labs at next visit.  Amber Stephens will continue to work on weight loss, exercise, and decreasing simple carbohydrates to help decrease the risk of diabetes.    3. Obesity, with current BMI of 34.7 [name] is currently in the action stage of change. As such, her goal is to continue with weight loss efforts. She has agreed to the Category 2 Plan.    Exercise goals: As is.  We will obtain labs at next visit.  Behavioral modification strategies: increasing lean protein intake, increasing water intake, and planning for success.  [name] has agreed to follow-up with our clinic in 3 weeks. She was informed of the importance of frequent follow-up visits to maximize her success with intensive lifestyle modifications for her multiple health conditions.   Objective:   Blood pressure 122/82, pulse 68, temperature (!) 97.3 F (36.3 C), height '5\' 2"'$  (1.575 m), weight 189 lb (85.7 kg), last menstrual period 06/04/2017, SpO2 99 %. Body mass index is 34.57 kg/m.  General: Cooperative, alert, well developed, in no acute distress. HEENT: Conjunctivae and lids unremarkable. Cardiovascular: Regular rhythm.  Lungs: Normal work of breathing. Neurologic: No focal deficits.   Lab Results  Component Value Date   CREATININE 0.96 03/26/2021   BUN 19 03/26/2021   NA 143 03/26/2021   K 4.4 03/26/2021   CL 104 03/26/2021   CO2 23 03/26/2021   Lab Results  Component Value Date   ALT 20 03/26/2021   AST 21 03/26/2021   ALKPHOS 59 03/26/2021   BILITOT <0.2 03/26/2021   Lab Results  Component Value Date   HGBA1C 5.7 (H) 03/26/2021   Lab Results  Component Value Date   INSULIN 19.1 03/26/2021   Lab Results  Component Value Date   TSH 0.913 03/26/2021   Lab Results  Component Value Date   CHOL 183 03/26/2021   HDL 55 03/26/2021   LDLCALC 115 (  H) 03/26/2021   TRIG 69 03/26/2021   CHOLHDL 3 05/28/2020   Lab Results  Component Value Date   VD25OH 25.8 (L) 03/26/2021   Lab Results  Component Value Date   WBC 5.1 04/03/2019   HGB 13.1 04/03/2019   HCT 39.0 04/03/2019   MCV 89.6 04/03/2019   PLT 332.0 04/03/2019   Lab Results  Component Value Date   IRON 106 10/30/2012   Attestation Statements:   Reviewed by clinician on day of visit: allergies, medications, problem list, medical history, surgical history, family history,  social history, and previous encounter notes.  Time spent on visit including pre-visit chart review and post-visit care and charting was 30 minutes.   I, Brendell Tyus, RMA, am acting as transcriptionist for Everardo Pacific, FNP..  I have reviewed the above documentation for accuracy and completeness, and I agree with the above. Everardo Pacific, FNP

## 2021-07-21 ENCOUNTER — Encounter: Payer: 59 | Admitting: Family

## 2021-08-05 ENCOUNTER — Ambulatory Visit (INDEPENDENT_AMBULATORY_CARE_PROVIDER_SITE_OTHER): Payer: 59 | Admitting: Nurse Practitioner

## 2021-08-05 ENCOUNTER — Encounter (INDEPENDENT_AMBULATORY_CARE_PROVIDER_SITE_OTHER): Payer: Self-pay | Admitting: Nurse Practitioner

## 2021-08-05 VITALS — BP 119/80 | HR 62 | Temp 98.1°F | Ht 62.0 in | Wt 189.0 lb

## 2021-08-05 DIAGNOSIS — E7849 Other hyperlipidemia: Secondary | ICD-10-CM | POA: Diagnosis not present

## 2021-08-05 DIAGNOSIS — E559 Vitamin D deficiency, unspecified: Secondary | ICD-10-CM | POA: Diagnosis not present

## 2021-08-05 DIAGNOSIS — R7303 Prediabetes: Secondary | ICD-10-CM | POA: Diagnosis not present

## 2021-08-05 DIAGNOSIS — Z6834 Body mass index (BMI) 34.0-34.9, adult: Secondary | ICD-10-CM

## 2021-08-05 DIAGNOSIS — E669 Obesity, unspecified: Secondary | ICD-10-CM | POA: Diagnosis not present

## 2021-08-05 NOTE — Progress Notes (Signed)
Chief Complaint:   OBESITY Amber Stephens is here to discuss her progress with her obesity treatment plan along with follow-up of her obesity related diagnoses. Amber Stephens is on the Category 2 Plan and states she is following her eating plan approximately 75% of the time. Amber Stephens states she is walking and riding her stationary bike 30-45 minutes 3 times per week.  Today's visit was #: 8 Starting weight: 205 lbs Starting date: 03/26/2021 Today's weight: 189 lbs Today's date: 08/05/2021 Total lbs lost to date: 16 lbs Total lbs lost since last in-office visit: 0  Interim History: Since her last visit she has been on vacation, had a family reunion, and a funeral.  She has family coming in this weekend and plans to celebrate her birthday.  She does well Monday-Friday and gets off track on the the weekends. She is doing better with decreasing her sugar intake.  She is drinking water daily.   Subjective:   1. Vitamin D insufficiency Amber Stephens stopped Vitamin D after last office visit, due to side effects of nausea.  Since stopping Vitamin D, her nausea has subsided.   2. Pre-diabetes Amber Stephens has a diagnosis of prediabetes based on her elevated HgA1c and was informed this puts her at greater risk of developing diabetes. She continues to work on diet and exercise to decrease her risk of diabetes. She denies nausea or hypoglycemia. Last A1c was 5.7.   Does not want to take Metformin.  She struggles with some hunger and cravings.   3. Other hyperlipidemia Amber Stephens has no family history of hyperlipidemia.  Has never been on medications. Patient denies myalgias.   The 10-year ASCVD risk score (Arnett DK, et al., 2019) is: 1.7%   Values used to calculate the score:     Age: 46 years     Sex: Female     Is Non-Hispanic African American: Yes     Diabetic: No     Tobacco smoker: No     Systolic Blood Pressure: 956 mmHg     Is BP treated: Yes     HDL Cholesterol: 55 mg/dL     Total Cholesterol: 183  mg/dL    Assessment/Plan:   1. Vitamin D insufficiency Check labs today.   - Comprehensive metabolic panel - VITAMIN D 25 Hydroxy (Vit-D Deficiency, Fractures)  2. Pre-diabetes Amber Stephens will continue to work on weight loss, exercise, and decreasing simple carbohydrates to help decrease the risk of diabetes.  Check labs today.   - Comprehensive metabolic panel - Hemoglobin A1c - Insulin, random  3. Other hyperlipidemia Cardiovascular risk and specific lipid/LDL goals reviewed.  We discussed several lifestyle modifications today and Amber Stephens will continue to work on diet, exercise and weight loss efforts. Orders and follow up as documented in patient record.   Counseling Intensive lifestyle modifications are the first line treatment for this issue. Dietary changes: Increase soluble fiber. Decrease simple carbohydrates. Exercise changes: Moderate to vigorous-intensity aerobic activity 150 minutes per week if tolerated. Lipid-lowering medications: see documented in medical record. Check labs today.  - Comprehensive metabolic panel - Lipid Panel With LDL/HDL Ratio  4. Obesity, with current BMI of 34.6 Check labs today.  - Comprehensive metabolic panel  Amber Stephens is currently in the action stage of change. As such, her goal is to continue with weight loss efforts. She has agreed to the Category 2 Plan.   Exercise goals:  As is.   Behavioral modification strategies: increasing lean protein intake, increasing water intake, and keeping a strict  food journal.  Amber Stephens has agreed to follow-up with our clinic in 2-3 weeks. She was informed of the importance of frequent follow-up visits to maximize her success with intensive lifestyle modifications for her multiple health conditions.   Amber Stephens was informed we would discuss her lab results at her next visit unless there is a critical issue that needs to be addressed sooner. Amber Stephens agreed to keep her next visit at the agreed upon time to  discuss these results.  Objective:   Blood pressure 119/80, pulse 62, temperature 98.1 F (36.7 C), height '5\' 2"'$  (1.575 m), weight 189 lb (85.7 kg), last menstrual period 06/04/2017, SpO2 100 %. Body mass index is 34.57 kg/m.  General: Cooperative, alert, well developed, in no acute distress. HEENT: Conjunctivae and lids unremarkable. Cardiovascular: Regular rhythm.  Lungs: Normal work of breathing. Neurologic: No focal deficits.   Lab Results  Component Value Date   CREATININE 0.96 03/26/2021   BUN 19 03/26/2021   NA 143 03/26/2021   K 4.4 03/26/2021   CL 104 03/26/2021   CO2 23 03/26/2021   Lab Results  Component Value Date   ALT 20 03/26/2021   AST 21 03/26/2021   ALKPHOS 59 03/26/2021   BILITOT <0.2 03/26/2021   Lab Results  Component Value Date   HGBA1C 5.7 (H) 03/26/2021   Lab Results  Component Value Date   INSULIN 19.1 03/26/2021   Lab Results  Component Value Date   TSH 0.913 03/26/2021   Lab Results  Component Value Date   CHOL 183 03/26/2021   HDL 55 03/26/2021   LDLCALC 115 (H) 03/26/2021   TRIG 69 03/26/2021   CHOLHDL 3 05/28/2020   Lab Results  Component Value Date   VD25OH 25.8 (L) 03/26/2021   Lab Results  Component Value Date   WBC 5.1 04/03/2019   HGB 13.1 04/03/2019   HCT 39.0 04/03/2019   MCV 89.6 04/03/2019   PLT 332.0 04/03/2019   Lab Results  Component Value Date   IRON 106 10/30/2012   Attestation Statements:   Reviewed by clinician on day of visit: allergies, medications, problem list, medical history, surgical history, family history, social history, and previous encounter notes.  I, Davy Pique, RMA, am acting as transcriptionist for Everardo Pacific, FNP   I have reviewed the above documentation for accuracy and completeness, and I agree with the above. Everardo Pacific, FNP

## 2021-08-06 LAB — COMPREHENSIVE METABOLIC PANEL
ALT: 16 IU/L (ref 0–32)
AST: 14 IU/L (ref 0–40)
Albumin/Globulin Ratio: 1.5 (ref 1.2–2.2)
Albumin: 4.6 g/dL (ref 3.8–4.8)
Alkaline Phosphatase: 58 IU/L (ref 44–121)
BUN/Creatinine Ratio: 18 (ref 9–23)
BUN: 18 mg/dL (ref 6–24)
Bilirubin Total: 0.3 mg/dL (ref 0.0–1.2)
CO2: 24 mmol/L (ref 20–29)
Calcium: 9.7 mg/dL (ref 8.7–10.2)
Chloride: 103 mmol/L (ref 96–106)
Creatinine, Ser: 0.99 mg/dL (ref 0.57–1.00)
Globulin, Total: 3.1 g/dL (ref 1.5–4.5)
Glucose: 88 mg/dL (ref 70–99)
Potassium: 4.5 mmol/L (ref 3.5–5.2)
Sodium: 141 mmol/L (ref 134–144)
Total Protein: 7.7 g/dL (ref 6.0–8.5)
eGFR: 71 mL/min/{1.73_m2} (ref >59)

## 2021-08-06 LAB — LIPID PANEL WITH LDL/HDL RATIO
Cholesterol, Total: 188 mg/dL (ref 100–199)
HDL: 56 mg/dL (ref >39)
LDL Chol Calc (NIH): 121 mg/dL — ABNORMAL HIGH (ref 0–99)
LDL/HDL Ratio: 2.2 ratio (ref 0.0–3.2)
Triglycerides: 59 mg/dL (ref 0–149)
VLDL Cholesterol Cal: 11 mg/dL (ref 5–40)

## 2021-08-06 LAB — VITAMIN D 25 HYDROXY (VIT D DEFICIENCY, FRACTURES): Vit D, 25-Hydroxy: 72.4 ng/mL (ref 30.0–100.0)

## 2021-08-06 LAB — HEMOGLOBIN A1C
Est. average glucose Bld gHb Est-mCnc: 111 mg/dL
Hgb A1c MFr Bld: 5.5 % (ref 4.8–5.6)

## 2021-08-06 LAB — INSULIN, RANDOM: INSULIN: 11.3 u[IU]/mL (ref 2.6–24.9)

## 2021-08-18 ENCOUNTER — Ambulatory Visit (INDEPENDENT_AMBULATORY_CARE_PROVIDER_SITE_OTHER): Payer: 59 | Admitting: Nurse Practitioner

## 2021-08-18 VITALS — BP 119/83 | HR 90 | Temp 97.4°F | Ht 62.0 in | Wt 189.0 lb

## 2021-08-18 DIAGNOSIS — E669 Obesity, unspecified: Secondary | ICD-10-CM

## 2021-08-18 DIAGNOSIS — E66812 Obesity, class 2: Secondary | ICD-10-CM

## 2021-08-18 DIAGNOSIS — E559 Vitamin D deficiency, unspecified: Secondary | ICD-10-CM | POA: Diagnosis not present

## 2021-08-18 DIAGNOSIS — Z6834 Body mass index (BMI) 34.0-34.9, adult: Secondary | ICD-10-CM

## 2021-08-18 DIAGNOSIS — E7849 Other hyperlipidemia: Secondary | ICD-10-CM

## 2021-08-18 DIAGNOSIS — R638 Other symptoms and signs concerning food and fluid intake: Secondary | ICD-10-CM | POA: Diagnosis not present

## 2021-08-18 DIAGNOSIS — Z6837 Body mass index (BMI) 37.0-37.9, adult: Secondary | ICD-10-CM

## 2021-08-18 MED ORDER — VITAMIN D (ERGOCALCIFEROL) 1.25 MG (50000 UNIT) PO CAPS: 50000.0000 [IU] | ORAL_CAPSULE | ORAL | 0 refills | Status: DC

## 2021-08-18 MED ORDER — BUPROPION HCL ER (SR) 150 MG PO TB12: 150.0000 mg | ORAL_TABLET | Freq: Every day | ORAL | 0 refills | Status: DC

## 2021-08-18 NOTE — Patient Instructions (Signed)
The 10-year ASCVD risk score (Arnett DK, et al., 2019) is: 1.8%   Values used to calculate the score:     Age: 46 years     Sex: Female     Is Non-Hispanic African American: Yes     Diabetic: No     Tobacco smoker: No     Systolic Blood Pressure: 110 mmHg     Is BP treated: Yes     HDL Cholesterol: 56 mg/dL     Total Cholesterol: 188 mg/dL

## 2021-09-01 ENCOUNTER — Encounter (INDEPENDENT_AMBULATORY_CARE_PROVIDER_SITE_OTHER): Payer: Self-pay | Admitting: Nurse Practitioner

## 2021-09-01 ENCOUNTER — Ambulatory Visit (INDEPENDENT_AMBULATORY_CARE_PROVIDER_SITE_OTHER): Payer: 59 | Admitting: Nurse Practitioner

## 2021-09-01 VITALS — BP 129/85 | HR 80 | Temp 98.3°F | Ht 62.0 in | Wt 186.0 lb

## 2021-09-01 DIAGNOSIS — R638 Other symptoms and signs concerning food and fluid intake: Secondary | ICD-10-CM | POA: Diagnosis not present

## 2021-09-01 DIAGNOSIS — Z6834 Body mass index (BMI) 34.0-34.9, adult: Secondary | ICD-10-CM

## 2021-09-01 DIAGNOSIS — E669 Obesity, unspecified: Secondary | ICD-10-CM | POA: Diagnosis not present

## 2021-09-01 DIAGNOSIS — E66812 Obesity, class 2: Secondary | ICD-10-CM

## 2021-09-01 DIAGNOSIS — E559 Vitamin D deficiency, unspecified: Secondary | ICD-10-CM

## 2021-09-01 MED ORDER — VITAMIN D (ERGOCALCIFEROL) 1.25 MG (50000 UNIT) PO CAPS: 50000.0000 [IU] | ORAL_CAPSULE | ORAL | 0 refills | Status: DC

## 2021-09-02 NOTE — Progress Notes (Signed)
Chief Complaint:   OBESITY Amber Stephens is here to discuss her progress with her obesity treatment plan along with follow-up of her obesity related diagnoses. Amber Stephens is on the Category 2 Plan and states she is following her eating plan approximately 90% of the time. Amber Stephens states she is walking and other exercises 45 minutes 3 times per week.  Today's visit was #: 10 Starting weight: 205 lbs Starting date: 03/26/2021 Today's weight: 186 lbs Today's date: 09/01/2021 Total lbs lost to date: 19 lbs Total lbs lost since last in-office visit: 3  Interim History: Amber Stephens has done well with weight loss since her last visit. She is doing better, making healthier choices and watching her portion sizes. She is not eating out as much. Meeting her protein goals. Drinking water and protein shakes. She is going on vacation this weekend.  Subjective:   1. Abnormal craving Amber Stephens started Wellbutrin SR 150 mg after her last visit. Initially had side effects of dizziness but that has now subsided. She denies any new side effects. Has helped to decrease cravings. She is not eating as many sweets.  2. Vitamin D insufficiency Amber Stephens is currently taking prescription Vit D 50,000 IU every 2 weeks. Denies any nausea, vomiting or muscle weakness.  Assessment/Plan:   1. Abnormal craving Amber Stephens will continue taking Wellbutrin SR 150 mg daily. Side effects discussed.   2. Vitamin D insufficiency We will refill Vit D 50,000 IU once every two weeks for 1 month with 0 refills.  Low Vitamin D level contributes to fatigue and are associated with obesity, breast, and colon cancer. She agrees to continue to take prescription Vitamin D '@50'$ ,000 IU every week and will follow-up for routine testing of Vitamin D, at least 2-3 times per year to avoid over-replacement.   -Refill Vitamin D, Ergocalciferol, (DRISDOL) 1.25 MG (50000 UNIT) CAPS capsule; Take 1 capsule (50,000 Units total) by mouth every 14 (fourteen)  days.  Dispense: 2 capsule; Refill: 0  3. Obesity, with current BMI of 34.2 Amber Stephens is currently in the action stage of change. As such, her goal is to continue with weight loss efforts. She has agreed to the Category 2 Plan.   Exercise goals: As is.  Behavioral modification strategies: increasing lean protein intake, increasing vegetables, increasing water intake, and meal planning and cooking strategies.  Amber Stephens has agreed to follow-up with our clinic in 2 weeks. She was informed of the importance of frequent follow-up visits to maximize her success with intensive lifestyle modifications for her multiple health conditions.   Objective:   Blood pressure 129/85, pulse 80, temperature 98.3 F (36.8 C), height '5\' 2"'$  (1.575 m), weight 186 lb (84.4 kg), last menstrual period 06/04/2017, SpO2 100 %. Body mass index is 34.02 kg/m.  General: Cooperative, alert, well developed, in no acute distress. HEENT: Conjunctivae and lids unremarkable. Cardiovascular: Regular rhythm.  Lungs: Normal work of breathing. Neurologic: No focal deficits.   Lab Results  Component Value Date   CREATININE 0.99 08/05/2021   BUN 18 08/05/2021   NA 141 08/05/2021   K 4.5 08/05/2021   CL 103 08/05/2021   CO2 24 08/05/2021   Lab Results  Component Value Date   ALT 16 08/05/2021   AST 14 08/05/2021   ALKPHOS 58 08/05/2021   BILITOT 0.3 08/05/2021   Lab Results  Component Value Date   HGBA1C 5.5 08/05/2021   HGBA1C 5.7 (H) 03/26/2021   Lab Results  Component Value Date   INSULIN 11.3 08/05/2021  INSULIN 19.1 03/26/2021   Lab Results  Component Value Date   TSH 0.913 03/26/2021   Lab Results  Component Value Date   CHOL 188 08/05/2021   HDL 56 08/05/2021   LDLCALC 121 (H) 08/05/2021   TRIG 59 08/05/2021   CHOLHDL 3 05/28/2020   Lab Results  Component Value Date   VD25OH 72.4 08/05/2021   VD25OH 25.8 (L) 03/26/2021   Lab Results  Component Value Date   WBC 5.1 04/03/2019   HGB 13.1  04/03/2019   HCT 39.0 04/03/2019   MCV 89.6 04/03/2019   PLT 332.0 04/03/2019   Lab Results  Component Value Date   IRON 106 10/30/2012   Attestation Statements:   Reviewed by clinician on day of visit: allergies, medications, problem list, medical history, surgical history, family history, social history, and previous encounter notes.  I, Brendell Tyus, RMA, am acting as transcriptionist for Everardo Pacific, FNP.  I have reviewed the above documentation for accuracy and completeness, and I agree with the above. Everardo Pacific, FNP

## 2021-09-03 NOTE — Progress Notes (Signed)
Chief Complaint:   OBESITY Amber Stephens is here to discuss her progress with her obesity treatment plan along with follow-up of her obesity related diagnoses. Radiance is on the Category 2 Plan and states she is following her eating plan approximately 75% of the time. [name] states she is walking 30-45 minutes 3 times per week.  Today's visit was #: 9 Starting weight: 205 lbs Starting date: 03/26/2021 Today's weight: 189 lbs Today's date: 08/18/2021 Total lbs lost to date: 16 lbs Total lbs lost since last in-office visit: 0  Interim History: Amber Stephens has maintained her weight since her last visit.  She is struggling with cravings and some hunger.   Subjective:   1. Other hyperlipidemia Geraldin has no family history of hyperlipidemia.  Has never been on medications.  2. Vitamin D insufficiency Amber Stephens is currently taking prescription Vit D 50,000 IU every 2 weeks. Denies any nausea, vomiting or muscle weakness.  3. Abnormal craving Struggling with cravings and some hunger.    Assessment/Plan:   1. Other hyperlipidemia Cardiovascular risk and specific lipid/LDL goals reviewed.  We discussed several lifestyle modifications today and Berneita will continue to work on diet, exercise and weight loss efforts. Orders and follow up as documented in patient record.   2. Vitamin D insufficiency Low Vitamin D level contributes to fatigue and are associated with obesity, breast, and colon cancer. She agrees to continue to take prescription Vitamin D '@50'$ ,000 IU every week and will follow-up for routine testing of Vitamin D, at least 2-3 times per year to avoid over-replacement.  3. Abnormal craving We will Start Wellbutrin SR 150 mg by mouth daily for 1 month with 0 refills.  Side effects discussed.   -Refill buPROPion (WELLBUTRIN SR) 150 MG 12 hr tablet; Take 1 tablet (150 mg total) by mouth daily.  Dispense: 30 tablet; Refill: 0  4. Obesity, with current BMI of 34.6 Amber Stephens is currently  in the action stage of change. As such, her goal is to continue with weight loss efforts. She has agreed to the Category 2 Plan.   Exercise goals: As is.  Behavioral modification strategies: increasing lean protein intake, increasing water intake, and planning for success.  Amber Stephens has agreed to follow-up with our clinic in 2 weeks. She was informed of the importance of frequent follow-up visits to maximize her success with intensive lifestyle modifications for her multiple health conditions.   Objective:   Blood pressure 119/83, pulse 90, temperature (!) 97.4 F (36.3 C), height '5\' 2"'$  (1.575 m), weight 189 lb (85.7 kg), last menstrual period 06/04/2017, SpO2 98 %. Body mass index is 34.57 kg/m.  General: Cooperative, alert, well developed, in no acute distress. HEENT: Conjunctivae and lids unremarkable. Cardiovascular: Regular rhythm.  Lungs: Normal work of breathing. Neurologic: No focal deficits.   Lab Results  Component Value Date   CREATININE 0.99 08/05/2021   BUN 18 08/05/2021   NA 141 08/05/2021   K 4.5 08/05/2021   CL 103 08/05/2021   CO2 24 08/05/2021   Lab Results  Component Value Date   ALT 16 08/05/2021   AST 14 08/05/2021   ALKPHOS 58 08/05/2021   BILITOT 0.3 08/05/2021   Lab Results  Component Value Date   HGBA1C 5.5 08/05/2021   HGBA1C 5.7 (H) 03/26/2021   Lab Results  Component Value Date   INSULIN 11.3 08/05/2021   INSULIN 19.1 03/26/2021   Lab Results  Component Value Date   TSH 0.913 03/26/2021   Lab Results  Component  Value Date   CHOL 188 08/05/2021   HDL 56 08/05/2021   LDLCALC 121 (H) 08/05/2021   TRIG 59 08/05/2021   CHOLHDL 3 05/28/2020   Lab Results  Component Value Date   VD25OH 72.4 08/05/2021   VD25OH 25.8 (L) 03/26/2021   Lab Results  Component Value Date   WBC 5.1 04/03/2019   HGB 13.1 04/03/2019   HCT 39.0 04/03/2019   MCV 89.6 04/03/2019   PLT 332.0 04/03/2019   Lab Results  Component Value Date   IRON 106  10/30/2012   Attestation Statements:   Reviewed by clinician on day of visit: allergies, medications, problem list, medical history, surgical history, family history, social history, and previous encounter notes.  I, Brendell Tyus, RMA, am acting as transcriptionist for Everardo Pacific, FNP.  I have reviewed the above documentation for accuracy and completeness, and I agree with the above. Everardo Pacific, FNP

## 2021-09-09 ENCOUNTER — Encounter (INDEPENDENT_AMBULATORY_CARE_PROVIDER_SITE_OTHER): Payer: Self-pay

## 2021-09-09 ENCOUNTER — Other Ambulatory Visit (INDEPENDENT_AMBULATORY_CARE_PROVIDER_SITE_OTHER): Payer: Self-pay | Admitting: Nurse Practitioner

## 2021-09-09 DIAGNOSIS — R638 Other symptoms and signs concerning food and fluid intake: Secondary | ICD-10-CM

## 2021-09-09 DIAGNOSIS — E559 Vitamin D deficiency, unspecified: Secondary | ICD-10-CM

## 2021-09-16 ENCOUNTER — Encounter (INDEPENDENT_AMBULATORY_CARE_PROVIDER_SITE_OTHER): Payer: Self-pay | Admitting: Nurse Practitioner

## 2021-09-16 ENCOUNTER — Ambulatory Visit (INDEPENDENT_AMBULATORY_CARE_PROVIDER_SITE_OTHER): Payer: 59 | Admitting: Nurse Practitioner

## 2021-09-16 VITALS — BP 115/77 | HR 69 | Temp 97.8°F | Ht 62.0 in | Wt 189.0 lb

## 2021-09-16 DIAGNOSIS — R638 Other symptoms and signs concerning food and fluid intake: Secondary | ICD-10-CM | POA: Diagnosis not present

## 2021-09-16 DIAGNOSIS — Z6834 Body mass index (BMI) 34.0-34.9, adult: Secondary | ICD-10-CM

## 2021-09-16 DIAGNOSIS — E669 Obesity, unspecified: Secondary | ICD-10-CM | POA: Diagnosis not present

## 2021-09-16 MED ORDER — BUPROPION HCL ER (SR) 200 MG PO TB12: 200.0000 mg | ORAL_TABLET | Freq: Every day | ORAL | 0 refills | Status: DC

## 2021-09-23 ENCOUNTER — Encounter: Payer: 59 | Admitting: Family

## 2021-09-23 NOTE — Progress Notes (Unsigned)
Chief Complaint:   OBESITY Amber Stephens is here to discuss her progress with her obesity treatment plan along with follow-up of her obesity related diagnoses. Amber Stephens is on the Category 2 Plan and states she is following her eating plan approximately 10% of the time. Amber Stephens states she is walking 30 minutes 2 times per week.  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 Some stress eating and not meeting protein or water goals. Drinking protein shakes in the am.  Subjective:   1. Abnormal craving Amber Stephens is currently taking Wellbutrin SR 150 mg. Denies any side effects. She is asking to increase dose.  Assessment/Plan:   1. Abnormal craving Increase/Refill Wellbutrin SR to 200 mg daily for 1 month with 0 refills. Side effects discussed.   -Refill/Increase buPROPion (WELLBUTRIN SR) 200 MG 12 hr tablet; Take 1 tablet (200 mg total) by mouth daily.  Dispense: 30 tablet; Refill: 0   2. Obesity, with current BMI of 34.6 Amber Stephens is currently in the action stage of change. As such, her goal is to continue with weight loss efforts. She has agreed to the Category 2 Plan.   Exercise goals: As is.  Behavioral modification strategies: increasing lean protein intake, increasing water intake, and planning for success.  [name] has agreed to follow-up with our clinic in 2 weeks. She was informed of the importance of frequent follow-up visits to maximize her success with intensive lifestyle modifications for her multiple health conditions.   Objective:   Blood pressure 115/77, pulse 69, temperature 97.8 F (36.6 C), height '5\' 2"'$  (1.575 m), weight 189 lb (85.7 kg), last menstrual period 06/04/2017, SpO2 98 %. Body  mass index is 34.57 kg/m.  General: Cooperative, alert, well developed, in no acute distress. HEENT: Conjunctivae and lids unremarkable. Cardiovascular: Regular rhythm.  Lungs: Normal work of breathing. Neurologic: No focal deficits.   Lab Results  Component Value Date   CREATININE 0.99 08/05/2021   BUN 18 08/05/2021   NA 141 08/05/2021   K 4.5 08/05/2021   CL 103 08/05/2021   CO2 24 08/05/2021   Lab Results  Component Value Date   ALT 16 08/05/2021   AST 14 08/05/2021   ALKPHOS 58 08/05/2021   BILITOT 0.3 08/05/2021   Lab Results  Component Value Date   HGBA1C 5.5 08/05/2021   HGBA1C 5.7 (H) 03/26/2021   Lab Results  Component Value Date   INSULIN 11.3 08/05/2021   INSULIN 19.1 03/26/2021   Lab Results  Component Value Date   TSH 0.913 03/26/2021   Lab Results  Component Value Date   CHOL 188 08/05/2021   HDL 56 08/05/2021   LDLCALC 121 (H) 08/05/2021   TRIG 59 08/05/2021   CHOLHDL 3 05/28/2020   Lab Results  Component Value Date   VD25OH 72.4 08/05/2021   VD25OH 25.8 (L) 03/26/2021   Lab Results  Component Value Date   WBC 5.1 04/03/2019   HGB 13.1 04/03/2019   HCT 39.0 04/03/2019   MCV 89.6 04/03/2019   PLT 332.0 04/03/2019   Lab Results  Component Value Date   IRON 106 10/30/2012   Attestation Statements:   Reviewed by clinician on day of visit: allergies, medications, problem list, medical history,  surgical history, family history, social history, and previous encounter notes.  I, Brendell Tyus, RMA, am acting as transcriptionist for Everardo Pacific, FNP.  I have reviewed the above documentation for accuracy and completeness, and I agree with the above. Everardo Pacific, FNP

## 2021-09-30 ENCOUNTER — Ambulatory Visit (INDEPENDENT_AMBULATORY_CARE_PROVIDER_SITE_OTHER): Payer: 59 | Admitting: Nurse Practitioner

## 2021-09-30 ENCOUNTER — Encounter (INDEPENDENT_AMBULATORY_CARE_PROVIDER_SITE_OTHER): Payer: Self-pay | Admitting: Nurse Practitioner

## 2021-09-30 VITALS — BP 121/84 | HR 76 | Temp 98.5°F | Ht 62.0 in | Wt 186.0 lb

## 2021-09-30 DIAGNOSIS — E669 Obesity, unspecified: Secondary | ICD-10-CM | POA: Diagnosis not present

## 2021-09-30 DIAGNOSIS — R638 Other symptoms and signs concerning food and fluid intake: Secondary | ICD-10-CM

## 2021-09-30 DIAGNOSIS — Z6834 Body mass index (BMI) 34.0-34.9, adult: Secondary | ICD-10-CM

## 2021-10-05 DIAGNOSIS — R638 Other symptoms and signs concerning food and fluid intake: Secondary | ICD-10-CM | POA: Insufficient documentation

## 2021-10-05 NOTE — Progress Notes (Signed)
Chief Complaint:   OBESITY Amber Stephens is here to discuss her progress with her obesity treatment plan along with follow-up of her obesity related diagnoses. Amber Stephens is on the Category 2 Plan and states she is following her eating plan approximately 85% of the time. Amber Stephens states she is walking, riding her stationary bike 30 minutes 3 times per week.  Today's visit was #: 12 Starting weight: 205 lbs Starting date: 03/26/2021 Today's weight: 186 lbs Today's date: 09/30/2021 Total lbs lost to date: 19 lbs Total lbs lost since last in-office visit: 3 lbs  Interim History: has done well with weight loss since her last visit.   Breakfast:  2 eggs with cheese, bacon Lunch:  premier protein shake Snack:  fruit Dinner:  protein and veggies Drinking water daily.   Subjective:   1. Abnormal craving Taking Wellbutrin SR 200 mg.  Reports headaches 2 days after starting Wellbutrin but wonders if it is due to stress.  Feels that since increasing Wellbutrin it has helped with cravings.    Assessment/Plan:   1. Abnormal craving Continue Wellbutrin SR 200 mg.  Side effects discussed.  Will continue to monitor headache.   2. Obesity, with current BMI of 34.2 Amber Stephens is currently in the action stage of change. As such, her goal is to continue with weight loss efforts. She has agreed to the Category 2 Plan.   Exercise goals:  as is.   Behavioral modification strategies: increasing lean protein intake, increasing vegetables, and increasing water intake.  Amber Stephens has agreed to follow-up with our clinic in 2 weeks. She was informed of the importance of frequent follow-up visits to maximize her success with intensive lifestyle modifications for her multiple health conditions.   Objective:   Blood pressure 121/84, pulse 76, temperature 98.5 F (36.9 C), height '5\' 2"'$  (1.575 m), weight 186 lb (84.4 kg), last menstrual period 06/04/2017, SpO2 100 %. Body mass index is 34.02 kg/m.  General:  Cooperative, alert, well developed, in no acute distress. HEENT: Conjunctivae and lids unremarkable. Cardiovascular: Regular rhythm.  Lungs: Normal work of breathing. Neurologic: No focal deficits.   Lab Results  Component Value Date   CREATININE 0.99 08/05/2021   BUN 18 08/05/2021   NA 141 08/05/2021   K 4.5 08/05/2021   CL 103 08/05/2021   CO2 24 08/05/2021   Lab Results  Component Value Date   ALT 16 08/05/2021   AST 14 08/05/2021   ALKPHOS 58 08/05/2021   BILITOT 0.3 08/05/2021   Lab Results  Component Value Date   HGBA1C 5.5 08/05/2021   HGBA1C 5.7 (H) 03/26/2021   Lab Results  Component Value Date   INSULIN 11.3 08/05/2021   INSULIN 19.1 03/26/2021   Lab Results  Component Value Date   TSH 0.913 03/26/2021   Lab Results  Component Value Date   CHOL 188 08/05/2021   HDL 56 08/05/2021   LDLCALC 121 (H) 08/05/2021   TRIG 59 08/05/2021   CHOLHDL 3 05/28/2020   Lab Results  Component Value Date   VD25OH 72.4 08/05/2021   VD25OH 25.8 (L) 03/26/2021   Lab Results  Component Value Date   WBC 5.1 04/03/2019   HGB 13.1 04/03/2019   HCT 39.0 04/03/2019   MCV 89.6 04/03/2019   PLT 332.0 04/03/2019   Lab Results  Component Value Date   IRON 106 10/30/2012   Attestation Statements:   Reviewed by clinician on day of visit: allergies, medications, problem list, medical history, surgical history, family history,  social history, and previous encounter notes.  I spent 30 minutes with the patient, before and after reviewing chart  I, Davy Pique, RMA, am acting as transcriptionist for Everardo Pacific, FNP  I have reviewed the above documentation for accuracy and completeness, and I agree with the above. Everardo Pacific, FNP

## 2021-10-06 ENCOUNTER — Other Ambulatory Visit (INDEPENDENT_AMBULATORY_CARE_PROVIDER_SITE_OTHER): Payer: Self-pay | Admitting: Nurse Practitioner

## 2021-10-06 DIAGNOSIS — E559 Vitamin D deficiency, unspecified: Secondary | ICD-10-CM

## 2021-10-09 ENCOUNTER — Other Ambulatory Visit (INDEPENDENT_AMBULATORY_CARE_PROVIDER_SITE_OTHER): Payer: Self-pay | Admitting: Nurse Practitioner

## 2021-10-09 DIAGNOSIS — R638 Other symptoms and signs concerning food and fluid intake: Secondary | ICD-10-CM

## 2021-10-14 ENCOUNTER — Encounter (INDEPENDENT_AMBULATORY_CARE_PROVIDER_SITE_OTHER): Payer: Self-pay | Admitting: Nurse Practitioner

## 2021-10-14 ENCOUNTER — Other Ambulatory Visit (INDEPENDENT_AMBULATORY_CARE_PROVIDER_SITE_OTHER): Payer: Self-pay | Admitting: Nurse Practitioner

## 2021-10-14 ENCOUNTER — Ambulatory Visit (INDEPENDENT_AMBULATORY_CARE_PROVIDER_SITE_OTHER): Payer: 59 | Admitting: Nurse Practitioner

## 2021-10-14 VITALS — BP 126/88 | HR 76 | Temp 98.7°F | Ht 62.0 in

## 2021-10-14 DIAGNOSIS — R638 Other symptoms and signs concerning food and fluid intake: Secondary | ICD-10-CM | POA: Diagnosis not present

## 2021-10-14 DIAGNOSIS — E669 Obesity, unspecified: Secondary | ICD-10-CM | POA: Diagnosis not present

## 2021-10-14 DIAGNOSIS — E559 Vitamin D deficiency, unspecified: Secondary | ICD-10-CM | POA: Diagnosis not present

## 2021-10-14 DIAGNOSIS — Z6833 Body mass index (BMI) 33.0-33.9, adult: Secondary | ICD-10-CM

## 2021-10-14 DIAGNOSIS — I10 Essential (primary) hypertension: Secondary | ICD-10-CM | POA: Diagnosis not present

## 2021-10-14 MED ORDER — VITAMIN D (ERGOCALCIFEROL) 1.25 MG (50000 UNIT) PO CAPS: 50000.0000 [IU] | ORAL_CAPSULE | ORAL | 0 refills | Status: DC

## 2021-10-14 MED ORDER — BUPROPION HCL ER (SR) 200 MG PO TB12: 200.0000 mg | ORAL_TABLET | Freq: Every day | ORAL | 0 refills | Status: DC

## 2021-10-15 NOTE — Progress Notes (Signed)
Chief Complaint:   OBESITY Amber Stephens is here to discuss her progress with her obesity treatment plan along with follow-up of her obesity related diagnoses. Amber Stephens is on the Category 2 Plan and states she is following her eating plan approximately 90% of the time. Amber Stephens states she is strength training/walking 30 minutes 3-4 times per week.  Today's visit was #: 37 Starting weight: 205 lbs Starting date: 03/26/2021 Today's weight: 185 lbs Today's date: 10/14/2021 Total lbs lost to date: 20 lbs Total lbs lost since last in-office visit: 1  Interim History: Amber Stephens has overall done well with weight loss. Denies polyphagia and cravings. Taking Wellbutrin SR 200 mg. Denies any side effects. She is drinking water daily. Denies sugary drinks.  Subjective:   1. Primary hypertension Amber Stephens is taking Norvasc 5 mg. Denies any side effects. denies any chest pain,shortness of breath or palpitations. Blood pressures at home 115/70.  2. Vitamin D insufficiency Amber Stephens is currently taking prescription Vit D 50,000 IU every 2 weeks.  3. Abnormal craving Amber Stephens is taking Wellbutrin SR 200 mg daily.  Denies side effects.   Assessment/Plan:   1. Primary hypertension Continue to follow up with PCP and continue medications as directed. Amber Stephens is working on healthy weight loss and exercise to improve blood pressure control. We will watch for signs of hypotension as she continues her lifestyle modifications.   2. Vitamin D insufficiency We will refill Vit D 50K IU once every 2 weeks for 1 month with 0 refills. Low Vitamin D level contributes to fatigue and are associated with obesity, breast, and colon cancer. She agrees to continue to take prescription Vitamin D '@50'$ ,000 IU every week and will follow-up for routine testing of Vitamin D, at least 2-3 times per year to avoid over-replacement.   -Refill Vitamin D, Ergocalciferol, (DRISDOL) 1.25 MG (50000 UNIT) CAPS capsule; Take 1 capsule (50,000  Units total) by mouth every 14 (fourteen) days.  Dispense: 2 capsule; Refill: 0  3. Abnormal craving We will refill Wellbutrin SR 200 mg daily for 1 month with 0 refills. Side effects discussed.   -Refill buPROPion (WELLBUTRIN SR) 200 MG 12 hr tablet; Take 1 tablet (200 mg total) by mouth daily.  Dispense: 30 tablet; Refill: 0  4. Obesity, with current BMI of 33.9 Amber Stephens is currently in the action stage of change. As such, her goal is to continue with weight loss efforts. She has agreed to the Category 2 Plan.   Exercise goals: As is.  Behavioral modification strategies: increasing lean protein intake, increasing vegetables, and increasing water intake.  Amber Stephens has agreed to follow-up with our clinic in 2 weeks. She was informed of the importance of frequent follow-up visits to maximize her success with intensive lifestyle modifications for her multiple health conditions.   Objective:   Blood pressure 126/88, pulse 76, temperature 98.7 F (37.1 C), height '5\' 2"'$  (1.575 m), last menstrual period 06/04/2017, SpO2 99 %. Body mass index is 34.02 kg/m.  General: Cooperative, alert, well developed, in no acute distress. HEENT: Conjunctivae and lids unremarkable. Cardiovascular: Regular rhythm.  Lungs: Normal work of breathing. Neurologic: No focal deficits.   Lab Results  Component Value Date   CREATININE 0.99 08/05/2021   BUN 18 08/05/2021   NA 141 08/05/2021   K 4.5 08/05/2021   CL 103 08/05/2021   CO2 24 08/05/2021   Lab Results  Component Value Date   ALT 16 08/05/2021   AST 14 08/05/2021   ALKPHOS 58 08/05/2021  BILITOT 0.3 08/05/2021   Lab Results  Component Value Date   HGBA1C 5.5 08/05/2021   HGBA1C 5.7 (H) 03/26/2021   Lab Results  Component Value Date   INSULIN 11.3 08/05/2021   INSULIN 19.1 03/26/2021   Lab Results  Component Value Date   TSH 0.913 03/26/2021   Lab Results  Component Value Date   CHOL 188 08/05/2021   HDL 56 08/05/2021   LDLCALC  121 (H) 08/05/2021   TRIG 59 08/05/2021   CHOLHDL 3 05/28/2020   Lab Results  Component Value Date   VD25OH 72.4 08/05/2021   VD25OH 25.8 (L) 03/26/2021   Lab Results  Component Value Date   WBC 5.1 04/03/2019   HGB 13.1 04/03/2019   HCT 39.0 04/03/2019   MCV 89.6 04/03/2019   PLT 332.0 04/03/2019   Lab Results  Component Value Date   IRON 106 10/30/2012   Attestation Statements:   Reviewed by clinician on day of visit: allergies, medications, problem list, medical history, surgical history, family history, social history, and previous encounter notes.  Time spent on visit including pre-visit chart review and post-visit care and charting was 30 minutes.   I, Brendell Tyus, RMA, am acting as transcriptionist for Everardo Pacific, FNP.  I have reviewed the above documentation for accuracy and completeness, and I agree with the above. Everardo Pacific, FNP

## 2021-10-28 ENCOUNTER — Encounter: Payer: Self-pay | Admitting: Nurse Practitioner

## 2021-10-28 ENCOUNTER — Ambulatory Visit: Payer: 59 | Admitting: Nurse Practitioner

## 2021-10-28 VITALS — BP 113/78 | HR 65 | Temp 98.4°F | Ht 62.0 in | Wt 187.0 lb

## 2021-10-28 DIAGNOSIS — Z6834 Body mass index (BMI) 34.0-34.9, adult: Secondary | ICD-10-CM | POA: Diagnosis not present

## 2021-10-28 DIAGNOSIS — E669 Obesity, unspecified: Secondary | ICD-10-CM | POA: Diagnosis not present

## 2021-10-28 DIAGNOSIS — E559 Vitamin D deficiency, unspecified: Secondary | ICD-10-CM

## 2021-10-28 MED ORDER — VITAMIN D (ERGOCALCIFEROL) 1.25 MG (50000 UNIT) PO CAPS: 50000.0000 [IU] | ORAL_CAPSULE | ORAL | 1 refills | Status: DC

## 2021-10-29 NOTE — Progress Notes (Signed)
Chief Complaint:   OBESITY Amber Stephens is here to discuss her progress with her obesity treatment plan along with follow-up of her obesity related diagnoses. Amber Stephens is on the Category 2 Plan and states she is following her eating plan approximately 70% of the time. Amber Stephens states she is exercising 0 minutes 0 times per week.  Today's visit was #: 14 Starting weight: 205 lbs Starting date: 03/26/2021 Today's weight: 187 lbs Today's date: 10/28/2021 Total lbs lost to date: 18 lbs Total lbs lost since last in-office visit: 0  Interim History: Amber Stephens reports that this week has been hard. She has not been able to exercise. She is leaving for a conference tomorrow. She is in the process of selling her house after renovations. Notes some stress eating. Feels things should settle down soon. Drinking water and hot tea daily. Struggling with hunger. Her goal weight is 170 lbs.  Subjective:   1. Vitamin D insufficiency Amber Stephens is taking Vit D 50,000 IU every two weeks. Denies any nausea, vomiting or muscle weakness.  Assessment/Plan:   1. Vitamin D insufficiency We will refill Vit D 50,000 IU once every 2 weeks for 1 month with 0 refills. Low Vitamin D level contributes to fatigue and are associated with obesity, breast, and colon cancer. She agrees to continue to take prescription Vitamin D '@50'$ ,000 IU every week and will follow-up for routine testing of Vitamin D, at least 2-3 times per year to avoid over-replacement.   -Refill Vitamin D, Ergocalciferol, (DRISDOL) 1.25 MG (50000 UNIT) CAPS capsule; Take 1 capsule (50,000 Units total) by mouth every 14 (fourteen) days.  Dispense: 2 capsule; Refill: 1  2. Obesity, with current BMI of 34.2 Amber Stephens is currently in the action stage of change. As such, her goal is to continue with weight loss efforts. She has agreed to the Category 2 Plan and keeping a food journal and adhering to recommended goals of 1200-1300 calories and 75+ grams of protein.    Exercise goals: All adults should avoid inactivity. Some physical activity is better than none, and adults who participate in any amount of physical activity gain some health benefits.  Will obtain IC at next visit.  Behavioral modification strategies: increasing lean protein intake, increasing vegetables, increasing water intake, and travel eating strategies.  Amber Stephens has agreed to follow-up with our clinic in 2 weeks. She was informed of the importance of frequent follow-up visits to maximize her success with intensive lifestyle modifications for her multiple health conditions.   Objective:   Blood pressure 113/78, pulse 65, temperature 98.4 F (36.9 C), height '5\' 2"'$  (1.575 m), weight 187 lb (84.8 kg), last menstrual period 06/04/2017, SpO2 98 %. Body mass index is 34.2 kg/m.  General: Cooperative, alert, well developed, in no acute distress. HEENT: Conjunctivae and lids unremarkable. Cardiovascular: Regular rhythm.  Lungs: Normal work of breathing. Neurologic: No focal deficits.   Lab Results  Component Value Date   CREATININE 0.99 08/05/2021   BUN 18 08/05/2021   NA 141 08/05/2021   K 4.5 08/05/2021   CL 103 08/05/2021   CO2 24 08/05/2021   Lab Results  Component Value Date   ALT 16 08/05/2021   AST 14 08/05/2021   ALKPHOS 58 08/05/2021   BILITOT 0.3 08/05/2021   Lab Results  Component Value Date   HGBA1C 5.5 08/05/2021   HGBA1C 5.7 (H) 03/26/2021   Lab Results  Component Value Date   INSULIN 11.3 08/05/2021   INSULIN 19.1 03/26/2021   Lab Results  Component Value Date   TSH 0.913 03/26/2021   Lab Results  Component Value Date   CHOL 188 08/05/2021   HDL 56 08/05/2021   LDLCALC 121 (H) 08/05/2021   TRIG 59 08/05/2021   CHOLHDL 3 05/28/2020   Lab Results  Component Value Date   VD25OH 72.4 08/05/2021   VD25OH 25.8 (L) 03/26/2021   Lab Results  Component Value Date   WBC 5.1 04/03/2019   HGB 13.1 04/03/2019   HCT 39.0 04/03/2019   MCV 89.6  04/03/2019   PLT 332.0 04/03/2019   Lab Results  Component Value Date   IRON 106 10/30/2012   Attestation Statements:   Reviewed by clinician on day of visit: allergies, medications, problem list, medical history, surgical history, family history, social history, and previous encounter notes.  I, Brendell Tyus, RMA, am acting as transcriptionist for Everardo Pacific, FNP.  I have reviewed the above documentation for accuracy and completeness, and I agree with the above. Everardo Pacific, FNP

## 2021-11-03 ENCOUNTER — Other Ambulatory Visit (INDEPENDENT_AMBULATORY_CARE_PROVIDER_SITE_OTHER): Payer: Self-pay | Admitting: Nurse Practitioner

## 2021-11-03 DIAGNOSIS — E559 Vitamin D deficiency, unspecified: Secondary | ICD-10-CM

## 2021-11-08 ENCOUNTER — Other Ambulatory Visit (INDEPENDENT_AMBULATORY_CARE_PROVIDER_SITE_OTHER): Payer: Self-pay | Admitting: Nurse Practitioner

## 2021-11-08 DIAGNOSIS — R638 Other symptoms and signs concerning food and fluid intake: Secondary | ICD-10-CM

## 2021-11-11 ENCOUNTER — Encounter: Payer: Self-pay | Admitting: Nurse Practitioner

## 2021-11-11 ENCOUNTER — Ambulatory Visit: Payer: 59 | Admitting: Nurse Practitioner

## 2021-11-11 ENCOUNTER — Encounter: Payer: 59 | Admitting: Family

## 2021-11-11 VITALS — BP 125/84 | HR 81 | Temp 98.5°F | Ht 62.0 in | Wt 186.0 lb

## 2021-11-11 DIAGNOSIS — R0602 Shortness of breath: Secondary | ICD-10-CM | POA: Diagnosis not present

## 2021-11-11 DIAGNOSIS — R062 Wheezing: Secondary | ICD-10-CM

## 2021-11-11 DIAGNOSIS — E669 Obesity, unspecified: Secondary | ICD-10-CM

## 2021-11-11 DIAGNOSIS — Z6834 Body mass index (BMI) 34.0-34.9, adult: Secondary | ICD-10-CM

## 2021-11-11 DIAGNOSIS — R638 Other symptoms and signs concerning food and fluid intake: Secondary | ICD-10-CM | POA: Diagnosis not present

## 2021-11-11 DIAGNOSIS — E559 Vitamin D deficiency, unspecified: Secondary | ICD-10-CM | POA: Diagnosis not present

## 2021-11-11 MED ORDER — BUPROPION HCL ER (SR) 200 MG PO TB12: 200.0000 mg | ORAL_TABLET | Freq: Every day | ORAL | 0 refills | Status: DC

## 2021-11-11 MED ORDER — ALBUTEROL SULFATE HFA 108 (90 BASE) MCG/ACT IN AERS: INHALATION_SPRAY | RESPIRATORY_TRACT | 0 refills | Status: DC

## 2021-11-11 MED ORDER — VITAMIN D (ERGOCALCIFEROL) 1.25 MG (50000 UNIT) PO CAPS: 50000.0000 [IU] | ORAL_CAPSULE | ORAL | 1 refills | Status: DC

## 2021-11-12 ENCOUNTER — Other Ambulatory Visit: Payer: Self-pay | Admitting: Family

## 2021-11-16 NOTE — Progress Notes (Unsigned)
Chief Complaint:   OBESITY Amber Stephens is here to discuss her progress with her obesity treatment plan along with follow-up of her obesity related diagnoses. Amber Stephens is on the Category 2 Plan and states she is following her eating plan approximately 85% of the time. Amber Stephens states she is doing cardio and conditioning 30-60 minutes 3 times per week.  Today's visit was #: 14 Starting weight: 205 lbs Starting date: 03/26/2021 Today's weight: 186 lbs Today's date: 11/11/2021 Total lbs lost to date: 19 lbs Total lbs lost since last in-office visit: 0  Interim History: Amber Stephens goal weight:160. Not skipping meals. She went a conference since her last visit. Does well with breakfast but struggles with cravings in the evenings. Over the past week she is trying to do sometime daily, (exercise more).  Subjective:   1. Vitamin D insufficiency Amber Stephens is currently taking prescription Vit D 50,000 IU once every 2  weeks. Denies any nausea, vomiting or muscle weakness.  2. SOB (shortness of breath) on exertion Last IC was 1786 on 03/26/21. Today IC was 1627.  3. Abnormal craving Amber Stephens is taking Wellbutrin SR 200 mg. Denies any side effects. She is struggling with cravings in the evenings.  4. Wheezing Stopped Singular and Flonase and notes some wheezing. Since stopping Amber Stephens takes [employer], prn. She is asking for a refill today.  Assessment/Plan:   1. Vitamin D insufficiency We will refill Vit D 50,000 IU once weekly for 1 month with 0 refills.  -Refill Vitamin D, Ergocalciferol, (DRISDOL) 1.25 MG (50000 UNIT) CAPS capsule; Take 1 capsule (50,000 Units total) by mouth every 14 (fourteen) days.  Dispense: 2 capsule; Refill: 1  2. SOB (shortness of breath) on exertion Encouraged to increase in protein intake/water intake and exercise.  3. Abnormal craving We will refill Wellbutrin SR 200 mg daily for 1 month with 0 refills. Take at lunchtime for evening cravings. Consider increasing to  twice a day. Side effects discussed.   -Refill buPROPion (WELLBUTRIN SR) 200 MG 12 hr tablet; Take 1 tablet (200 mg total) by mouth daily.  Dispense: 30 tablet; Refill: 0  4. Wheezing We will refill albuterol 108 mcg for 1 month with 0 refills. As needed.  -Refill albuterol (PROAIR HFA) 108 (90 Base) MCG/ACT inhaler; USE 1-2 PUFFS BY MOUTH EVERY 4 HOURS AS NEEDED wheezing and shortness of breath  Dispense: 3 each; Refill: 0  5. Obesity, with current BMI of 34.1 Amber Stephens is currently in the action stage of change. As such, her goal is to continue with weight loss efforts. She has agreed to keeping a food journal and adhering to recommended goals of 1100-1200 calories and 75+ grams of protein.   Exercise goals: As is.   Amber Stephens is to plan to track and continue regular exercise.  Behavioral modification strategies: increasing lean protein intake, increasing water intake, planning for success, and decreasing junk food.  Amber Stephens has agreed to follow-up with our clinic in 3 weeks. She was informed of the importance of frequent follow-up visits to maximize her success with intensive lifestyle modifications for her multiple health conditions.   Objective:   Blood pressure 125/84, pulse 81, temperature 98.5 F (36.9 C), temperature source Oral, height '5\' 2"'$  (1.575 m), weight 186 lb (84.4 kg), last menstrual period 06/04/2017, SpO2 97 %. Body mass index is 34.02 kg/m.  General: Cooperative, alert, well developed, in no acute distress. HEENT: Conjunctivae and lids unremarkable. Cardiovascular: Regular rhythm.  Lungs: Normal work of breathing. Neurologic: No focal deficits.  Lab Results  Component Value Date   CREATININE 0.99 08/05/2021   BUN 18 08/05/2021   NA 141 08/05/2021   K 4.5 08/05/2021   CL 103 08/05/2021   CO2 24 08/05/2021   Lab Results  Component Value Date   ALT 16 08/05/2021   AST 14 08/05/2021   ALKPHOS 58 08/05/2021   BILITOT 0.3 08/05/2021   Lab Results   Component Value Date   HGBA1C 5.5 08/05/2021   HGBA1C 5.7 (H) 03/26/2021   Lab Results  Component Value Date   INSULIN 11.3 08/05/2021   INSULIN 19.1 03/26/2021   Lab Results  Component Value Date   TSH 0.913 03/26/2021   Lab Results  Component Value Date   CHOL 188 08/05/2021   HDL 56 08/05/2021   LDLCALC 121 (H) 08/05/2021   TRIG 59 08/05/2021   CHOLHDL 3 05/28/2020   Lab Results  Component Value Date   VD25OH 72.4 08/05/2021   VD25OH 25.8 (L) 03/26/2021   Lab Results  Component Value Date   WBC 5.1 04/03/2019   HGB 13.1 04/03/2019   HCT 39.0 04/03/2019   MCV 89.6 04/03/2019   PLT 332.0 04/03/2019   Lab Results  Component Value Date   IRON 106 10/30/2012   Attestation Statements:   Reviewed by clinician on day of visit: allergies, medications, problem list, medical history, surgical history, family history, social history, and previous encounter notes.  I, Brendell Tyus, RMA, am acting as transcriptionist for Everardo Pacific, FNP.  I have reviewed the above documentation for accuracy and completeness, and I agree with the above. -  ***

## 2021-11-17 ENCOUNTER — Other Ambulatory Visit (INDEPENDENT_AMBULATORY_CARE_PROVIDER_SITE_OTHER): Payer: Self-pay | Admitting: Nurse Practitioner

## 2021-11-17 DIAGNOSIS — R638 Other symptoms and signs concerning food and fluid intake: Secondary | ICD-10-CM

## 2021-11-18 ENCOUNTER — Other Ambulatory Visit: Payer: Self-pay | Admitting: Nurse Practitioner

## 2021-11-18 ENCOUNTER — Encounter: Payer: Self-pay | Admitting: Nurse Practitioner

## 2021-11-18 DIAGNOSIS — R638 Other symptoms and signs concerning food and fluid intake: Secondary | ICD-10-CM

## 2021-11-19 ENCOUNTER — Other Ambulatory Visit: Payer: Self-pay

## 2021-11-19 DIAGNOSIS — R638 Other symptoms and signs concerning food and fluid intake: Secondary | ICD-10-CM

## 2021-11-19 MED ORDER — BUPROPION HCL ER (SR) 200 MG PO TB12: 200.0000 mg | ORAL_TABLET | Freq: Every day | ORAL | 0 refills | Status: DC

## 2021-12-02 ENCOUNTER — Ambulatory Visit: Payer: 59 | Admitting: Nurse Practitioner

## 2021-12-02 ENCOUNTER — Encounter: Payer: Self-pay | Admitting: Nurse Practitioner

## 2021-12-02 VITALS — BP 122/82 | HR 73 | Temp 97.7°F | Ht 62.0 in | Wt 187.0 lb

## 2021-12-02 DIAGNOSIS — E669 Obesity, unspecified: Secondary | ICD-10-CM

## 2021-12-02 DIAGNOSIS — Z6834 Body mass index (BMI) 34.0-34.9, adult: Secondary | ICD-10-CM

## 2021-12-02 DIAGNOSIS — I10 Essential (primary) hypertension: Secondary | ICD-10-CM

## 2021-12-03 NOTE — Progress Notes (Signed)
Chief Complaint:   OBESITY Amber Stephens is here to discuss her progress with her obesity treatment plan along with follow-up of her obesity related diagnoses. Amber Stephens is on the Category 2 Plan and states she is following her eating plan approximately 75% of the time. Amber Stephens states she is using a Physiological scientist 60.5 minutes 2 times per week.  Today's visit was #: 15 Starting weight: 205 lbs Starting date: 03/26/2021 Today's weight: 187 lbs Today's date: 12/02/2021 Total lbs lost to date: 18 lbs Total lbs lost since last in-office visit: 0  Interim History: Amber Stephens calories:1400, protein: trying to eat a protein with every meal. Trying to decrease carbs. Plans to start working out more at home. Struggling with more hunger since increasing exercise. Drinking a protein shake and water daily.  Subjective:   1. Primary hypertension Amber Stephens is taking Norvasc 5 mg. Denies any chest pain,shortness of breath or palpitations. Goal is to stop blood pressure medication. Family history: father, maternal grandfather.  Assessment/Plan:   1. Primary hypertension Prior to next visit will take Norvasc 1/2 tablet 1 week prior to next visit.  Amber Stephens is working on healthy weight loss and exercise to improve blood pressure control. We will watch for signs of hypotension as she continues her lifestyle modifications.   2. Obesity, with current BMI of 34.3 Amber Stephens is currently in the action stage of change. As such, her goal is to continue with weight loss efforts. She has agreed to the Category 2 Plan.   Exercise goals: As is.  Behavioral modification strategies: increasing lean protein intake, increasing vegetables, increasing water intake, and keeping a strict food journal.  Amber Stephens has agreed to follow-up with our clinic in 4 weeks. She was informed of the importance of frequent follow-up visits to maximize her success with intensive lifestyle modifications for her multiple health conditions.    Objective:   Blood pressure 122/82, pulse 73, temperature 97.7 F (36.5 C), temperature source Oral, height '5\' 2"'$  (1.575 m), last menstrual period 06/04/2017, SpO2 100 %. Body mass index is 34.02 kg/m.  General: Cooperative, alert, well developed, in no acute distress. HEENT: Conjunctivae and lids unremarkable. Cardiovascular: Regular rhythm.  Lungs: Normal work of breathing. Neurologic: No focal deficits.   Lab Results  Component Value Date   CREATININE 0.99 08/05/2021   BUN 18 08/05/2021   NA 141 08/05/2021   K 4.5 08/05/2021   CL 103 08/05/2021   CO2 24 08/05/2021   Lab Results  Component Value Date   ALT 16 08/05/2021   AST 14 08/05/2021   ALKPHOS 58 08/05/2021   BILITOT 0.3 08/05/2021   Lab Results  Component Value Date   HGBA1C 5.5 08/05/2021   HGBA1C 5.7 (H) 03/26/2021   Lab Results  Component Value Date   INSULIN 11.3 08/05/2021   INSULIN 19.1 03/26/2021   Lab Results  Component Value Date   TSH 0.913 03/26/2021   Lab Results  Component Value Date   CHOL 188 08/05/2021   HDL 56 08/05/2021   LDLCALC 121 (H) 08/05/2021   TRIG 59 08/05/2021   CHOLHDL 3 05/28/2020   Lab Results  Component Value Date   VD25OH 72.4 08/05/2021   VD25OH 25.8 (L) 03/26/2021   Lab Results  Component Value Date   WBC 5.1 04/03/2019   HGB 13.1 04/03/2019   HCT 39.0 04/03/2019   MCV 89.6 04/03/2019   PLT 332.0 04/03/2019   Lab Results  Component Value Date   IRON 106 10/30/2012   Attestation  Statements:   Reviewed by clinician on day of visit: allergies, medications, problem list, medical history, surgical history, family history, social history, and previous encounter notes.  I spent 30 minutes with the patient and reviewing her chart before and after the visit.   I, Brendell Tyus, RMA, am acting as transcriptionist for Everardo Pacific, FNP.  I have reviewed the above documentation for accuracy and completeness, and I agree with the above. Everardo Pacific, FNP

## 2021-12-09 ENCOUNTER — Other Ambulatory Visit: Payer: Self-pay | Admitting: Family

## 2021-12-23 ENCOUNTER — Other Ambulatory Visit: Payer: Self-pay | Admitting: Family

## 2021-12-23 ENCOUNTER — Encounter: Payer: Self-pay | Admitting: Nurse Practitioner

## 2021-12-23 ENCOUNTER — Ambulatory Visit: Payer: 59 | Admitting: Nurse Practitioner

## 2021-12-23 VITALS — BP 139/90 | HR 79 | Temp 98.4°F | Ht 62.0 in | Wt 187.0 lb

## 2021-12-23 DIAGNOSIS — I10 Essential (primary) hypertension: Secondary | ICD-10-CM

## 2021-12-23 DIAGNOSIS — E559 Vitamin D deficiency, unspecified: Secondary | ICD-10-CM | POA: Diagnosis not present

## 2021-12-23 DIAGNOSIS — R638 Other symptoms and signs concerning food and fluid intake: Secondary | ICD-10-CM | POA: Diagnosis not present

## 2021-12-23 DIAGNOSIS — Z6834 Body mass index (BMI) 34.0-34.9, adult: Secondary | ICD-10-CM

## 2021-12-23 DIAGNOSIS — E669 Obesity, unspecified: Secondary | ICD-10-CM

## 2021-12-23 MED ORDER — VITAMIN D (ERGOCALCIFEROL) 1.25 MG (50000 UNIT) PO CAPS: 50000.0000 [IU] | ORAL_CAPSULE | ORAL | 1 refills | Status: DC

## 2021-12-31 NOTE — Progress Notes (Signed)
Chief Complaint:   OBESITY Amber Stephens is here to discuss her progress with her obesity treatment plan along with follow-up of her obesity related diagnoses. Amber Stephens is on the Category 2 Plan and states she is following her eating plan approximately 70% of the time. Amber Stephens states she is weightlifting/strength training 90 minutes 2 times per week.  Today's visit was #: 75 Starting weight: 205 lbs Starting date: 03/26/2021 Today's weight: 187 lbs Today's date: 12/23/2021 Total lbs lost to date: 18 lbs Total lbs lost since last in-office visit: 0  Interim History: Amber Stephens has maintained her weight since her last visit. She is cooking tomorrow and Friday for Thanksgiving. Notes she is snacking more especially on the weekends.  Subjective:   1. Abnormal craving Amber Stephens forgets to take Wellbutrin on a regular basis. Takes once per week. She can tell it's beneficial when taking it.  2. Vitamin D insufficiency Amber Stephens is currently taking Vit D every 14 days. Denies any side effects.  3. Primary hypertension Amber Stephens is currently taking Norvasc 5 mg 1/2 tablet. Denies any side effects. Blood pressure is elevated some today. Denies any chest pain,shortness of breath or palpitations.  Assessment/Plan:   1. Abnormal craving Plans to take Wellbutrin on regular basis. Side effects discussed. Will also try twice a day on the weekends since that's when she struggles more.   2. Vitamin D insufficiency We will refill Vit D 50,000 IU once every 2 weeks for 1 month with 0 refills.  Side effects discussed.     -Refill Vitamin D, Ergocalciferol, (DRISDOL) 1.25 MG (50000 UNIT) CAPS capsule; Take 1 capsule (50,000 Units total) by mouth every 14 (fourteen) days.  Dispense: 2 capsule; Refill: 1  3. Primary hypertension Continue taking Norvasc 5 mg 1/2 tab. Will watch blood pressure at home. If elevated will start taking a full tablet.  Amber Stephens is working on healthy weight loss and exercise to improve  blood pressure control. We will watch for signs of hypotension as she continues her lifestyle modifications.   4. Obesity, with current BMI of 34.2 Amber Stephens is currently in the action stage of change. As such, her goal is to continue with weight loss efforts. She has agreed to the Category 2 Plan.   Exercise goals: As is.  Behavioral modification strategies: increasing lean protein intake, increasing water intake, and holiday eating strategies .  Amber Stephens has agreed to follow-up with our clinic in 2 weeks. She was informed of the importance of frequent follow-up visits to maximize her success with intensive lifestyle modifications for her multiple health conditions.   Objective:   Blood pressure (!) 139/90, pulse 79, temperature 98.4 F (36.9 C), temperature source Oral, height '5\' 2"'$  (1.575 m), weight 187 lb (84.8 kg), last menstrual period 06/04/2017, SpO2 100 %. Body mass index is 34.2 kg/m.  General: Cooperative, alert, well developed, in no acute distress. HEENT: Conjunctivae and lids unremarkable. Cardiovascular: Regular rhythm.  Lungs: Normal work of breathing. Neurologic: No focal deficits.   Lab Results  Component Value Date   CREATININE 0.99 08/05/2021   BUN 18 08/05/2021   NA 141 08/05/2021   K 4.5 08/05/2021   CL 103 08/05/2021   CO2 24 08/05/2021   Lab Results  Component Value Date   ALT 16 08/05/2021   AST 14 08/05/2021   ALKPHOS 58 08/05/2021   BILITOT 0.3 08/05/2021   Lab Results  Component Value Date   HGBA1C 5.5 08/05/2021   HGBA1C 5.7 (H) 03/26/2021   Lab Results  Component Value Date   INSULIN 11.3 08/05/2021   INSULIN 19.1 03/26/2021   Lab Results  Component Value Date   TSH 0.913 03/26/2021   Lab Results  Component Value Date   CHOL 188 08/05/2021   HDL 56 08/05/2021   LDLCALC 121 (H) 08/05/2021   TRIG 59 08/05/2021   CHOLHDL 3 05/28/2020   Lab Results  Component Value Date   VD25OH 72.4 08/05/2021   VD25OH 25.8 (L) 03/26/2021    Lab Results  Component Value Date   WBC 5.1 04/03/2019   HGB 13.1 04/03/2019   HCT 39.0 04/03/2019   MCV 89.6 04/03/2019   PLT 332.0 04/03/2019   Lab Results  Component Value Date   IRON 106 10/30/2012   Attestation Statements:   Reviewed by clinician on day of visit: allergies, medications, problem list, medical history, surgical history, family history, social history, and previous encounter notes.  I, Brendell Tyus, RMA, am acting as transcriptionist for Amber Pacific, FNP.  I have reviewed the above documentation for accuracy and completeness, and I agree with the above. Amber Pacific, FNP

## 2022-01-06 ENCOUNTER — Encounter: Payer: Self-pay | Admitting: Nurse Practitioner

## 2022-01-06 ENCOUNTER — Ambulatory Visit: Payer: 59 | Admitting: Nurse Practitioner

## 2022-01-06 VITALS — BP 136/90 | HR 97 | Temp 97.7°F | Ht 62.0 in | Wt 186.0 lb

## 2022-01-06 DIAGNOSIS — Z6834 Body mass index (BMI) 34.0-34.9, adult: Secondary | ICD-10-CM

## 2022-01-06 DIAGNOSIS — I10 Essential (primary) hypertension: Secondary | ICD-10-CM

## 2022-01-06 DIAGNOSIS — E559 Vitamin D deficiency, unspecified: Secondary | ICD-10-CM | POA: Diagnosis not present

## 2022-01-06 DIAGNOSIS — E669 Obesity, unspecified: Secondary | ICD-10-CM | POA: Diagnosis not present

## 2022-01-06 DIAGNOSIS — R638 Other symptoms and signs concerning food and fluid intake: Secondary | ICD-10-CM | POA: Diagnosis not present

## 2022-01-06 MED ORDER — VITAMIN D (ERGOCALCIFEROL) 1.25 MG (50000 UNIT) PO CAPS: 50000.0000 [IU] | ORAL_CAPSULE | ORAL | 1 refills | Status: DC

## 2022-01-06 MED ORDER — BUPROPION HCL ER (SR) 200 MG PO TB12: 200.0000 mg | ORAL_TABLET | Freq: Two times a day (BID) | ORAL | 0 refills | Status: DC

## 2022-01-16 ENCOUNTER — Other Ambulatory Visit: Payer: Self-pay | Admitting: Family

## 2022-01-20 ENCOUNTER — Ambulatory Visit: Payer: 59 | Admitting: Nurse Practitioner

## 2022-01-20 ENCOUNTER — Encounter: Payer: Self-pay | Admitting: Nurse Practitioner

## 2022-01-20 VITALS — BP 129/85 | HR 92 | Temp 98.3°F | Ht 62.0 in | Wt 184.0 lb

## 2022-01-20 DIAGNOSIS — E559 Vitamin D deficiency, unspecified: Secondary | ICD-10-CM

## 2022-01-20 DIAGNOSIS — R638 Other symptoms and signs concerning food and fluid intake: Secondary | ICD-10-CM | POA: Diagnosis not present

## 2022-01-20 DIAGNOSIS — I10 Essential (primary) hypertension: Secondary | ICD-10-CM | POA: Diagnosis not present

## 2022-01-20 DIAGNOSIS — E669 Obesity, unspecified: Secondary | ICD-10-CM

## 2022-01-20 DIAGNOSIS — Z6833 Body mass index (BMI) 33.0-33.9, adult: Secondary | ICD-10-CM | POA: Diagnosis not present

## 2022-01-21 NOTE — Progress Notes (Signed)
Chief Complaint:   OBESITY Aveleen is here to discuss her progress with her obesity treatment plan along with follow-up of her obesity related diagnoses. Amber Stephens is on the Category 2 Plan and states she is following her eating plan approximately 85-90% of the time. Amber Stephens states she is Personal training/cardio 90 minutes 2 times per week.  Today's visit was #: 62 Starting weight: 205 lbs Starting date: 03/26/2021 Today's weight: 186 lbs Today's date: 01/06/2022 Total lbs lost to date: 19 lbs Total lbs lost since last in-office visit: 1  Interim History: Amber Stephens has overall done well with weight loss. Did well over Thanksgiving. Has maintained her weight since August, using L[employer] It app. Averaging 1200-1300 calories. Unsure of protein intake. Drinks a protein shake daily.  Subjective:   1. Vitamin D insufficiency Amber Stephens is currently taking prescription Vit D 50,000 IU once every 2 weeks. Denies any side effects.  Denies nausea, vomiting or muscle weakness.   2. Abnormal craving Amber Stephens has been taking Wellbutrin on a regular basis. Denies any side effects. Has helped with cravings.  3. Primary hypertension Amber Stephens is taking Norvasc 5 mg 1/2 tablet. Blood pressure is elevated again today. Denies any chest pain,shortness of breath or palpitations.  Assessment/Plan:   1. Vitamin D insufficiency We will refill Vit D 50K IU once a week for 1 month with 0 refills.  Side effects discussed.    -Refill Vitamin D, Ergocalciferol, (DRISDOL) 1.25 MG (50000 UNIT) CAPS capsule; Take 1 capsule (50,000 Units total) by mouth every 14 (fourteen) days.  Dispense: 2 capsule; Refill: 1  2. Abnormal craving We will refill Wellbutrin SR 200 mg twice daily for 13 months with 0 refills. Side effects discussed.   -Refill buPROPion (WELLBUTRIN SR) 200 MG 12 hr tablet; Take 1 tablet (200 mg total) by mouth 2 (two) times daily.  Dispense: 180 tablet; Refill: 0  3. Primary hypertension Increase  Norvasc back to 5 mg daily. Side effects discussed.   Amber Stephens is working on healthy weight loss and exercise to improve blood pressure control. We will watch for signs of hypotension as she continues her lifestyle modifications.   4. Obesity, with current BMI of 34.0 Amber Stephens is currently in the action stage of change. As such, her goal is to continue with weight loss efforts. She has agreed to keeping a food journal and adhering to recommended goals of 1200-1300 calories and 80+ grams of protein.   Exercise goals: As is.  Behavioral modification strategies: increasing lean protein intake, no skipping meals, and holiday eating strategies .  Amber Stephens has agreed to follow-up with our clinic in 2 weeks. She was informed of the importance of frequent follow-up visits to maximize her success with intensive lifestyle modifications for her multiple health conditions.   Objective:   Blood pressure (!) 136/90, pulse 97, temperature 97.7 F (36.5 C), temperature source Oral, height '5\' 2"'$  (1.575 m), weight 186 lb (84.4 kg), last menstrual period 06/04/2017, SpO2 97 %. Body mass index is 34.02 kg/m.  General: Cooperative, alert, well developed, in no acute distress. HEENT: Conjunctivae and lids unremarkable. Cardiovascular: Regular rhythm.  Lungs: Normal work of breathing. Neurologic: No focal deficits.   Lab Results  Component Value Date   CREATININE 0.99 08/05/2021   BUN 18 08/05/2021   NA 141 08/05/2021   K 4.5 08/05/2021   CL 103 08/05/2021   CO2 24 08/05/2021   Lab Results  Component Value Date   ALT 16 08/05/2021   AST 14 08/05/2021  ALKPHOS 58 08/05/2021   BILITOT 0.3 08/05/2021   Lab Results  Component Value Date   HGBA1C 5.5 08/05/2021   HGBA1C 5.7 (H) 03/26/2021   Lab Results  Component Value Date   INSULIN 11.3 08/05/2021   INSULIN 19.1 03/26/2021   Lab Results  Component Value Date   TSH 0.913 03/26/2021   Lab Results  Component Value Date   CHOL 188  08/05/2021   HDL 56 08/05/2021   LDLCALC 121 (H) 08/05/2021   TRIG 59 08/05/2021   CHOLHDL 3 05/28/2020   Lab Results  Component Value Date   VD25OH 72.4 08/05/2021   VD25OH 25.8 (L) 03/26/2021   Lab Results  Component Value Date   WBC 5.1 04/03/2019   HGB 13.1 04/03/2019   HCT 39.0 04/03/2019   MCV 89.6 04/03/2019   PLT 332.0 04/03/2019   Lab Results  Component Value Date   IRON 106 10/30/2012   Attestation Statements:   Reviewed by clinician on day of visit: allergies, medications, problem list, medical history, surgical history, family history, social history, and previous encounter notes.  Time spent on visit including pre-visit chart review and post-visit care and charting was 30 minutes.   I, Brendell Tyus, RMA, am acting as transcriptionist for Everardo Pacific, FNP.  I have reviewed the above documentation for accuracy and completeness, and I agree with the above. Everardo Pacific, FNP

## 2022-02-09 NOTE — Progress Notes (Signed)
Chief Complaint:   OBESITY Amber Stephens is here to discuss her progress with her obesity treatment plan along with follow-up of her obesity related diagnoses. [name] is on the Category 2 Plan and states she is following her eating plan approximately 85% of the time. [name] states she is walking 30-60 minutes 3 times per week.  Today's visit was #: 18 Starting weight: 205 lbs Starting date: 03/26/2021 Today's weight: 184 lbs Today's date: 01/20/2022 Total lbs lost to date: 21 lbs Total lbs lost since last in-office visit: 2  Interim History: Amber Stephens has done well with weight loss since her last visit.  She has been more active and is drinking more water.  Subjective:   1. Primary hypertension Amber Stephens's blood pressure looks good today.  Taking Norvasc 5 mg daily.  Denies side effects    2. Abnormal craving Amber Stephens is taking Wellbutrin SR at 200 mg twice a day.  Denies side effects.  Has helped with cravings. Assessment/Plan:   1. Primary hypertension Continue medications as directed. Amber Stephens is working on healthy weight loss and exercise to improve blood pressure control. We will watch for signs of hypotension as she continues her lifestyle modifications.   2. Abnormal craving Continue taking Wellbutrin twice a day.  Side effects discussed.  3. Obesity, with current BMI of 33.7 Amber Stephens is currently in the action stage of change. As such, her goal is to continue with weight loss efforts. She has agreed to the Category 2 Plan.   Exercise goals: As is.  Behavioral modification strategies: increasing lean protein intake, increasing water intake, and holiday eating strategies .  Amber Stephens has agreed to follow-up with our clinic in 2 weeks. She was informed of the importance of frequent follow-up visits to maximize her success with intensive lifestyle modifications for her multiple health conditions.   Objective:   Blood pressure 129/85, pulse 92, temperature 98.3 F (36.8 C),  temperature source Oral, height '5\' 2"'$  (1.575 m), weight 184 lb (83.5 kg), last menstrual period 06/04/2017, SpO2 99 %. Body mass index is 33.65 kg/m.  General: Cooperative, alert, well developed, in no acute distress. HEENT: Conjunctivae and lids unremarkable. Cardiovascular: Regular rhythm.  Lungs: Normal work of breathing. Neurologic: No focal deficits.   Lab Results  Component Value Date   CREATININE 0.99 08/05/2021   BUN 18 08/05/2021   NA 141 08/05/2021   K 4.5 08/05/2021   CL 103 08/05/2021   CO2 24 08/05/2021   Lab Results  Component Value Date   ALT 16 08/05/2021   AST 14 08/05/2021   ALKPHOS 58 08/05/2021   BILITOT 0.3 08/05/2021   Lab Results  Component Value Date   HGBA1C 5.5 08/05/2021   HGBA1C 5.7 (H) 03/26/2021   Lab Results  Component Value Date   INSULIN 11.3 08/05/2021   INSULIN 19.1 03/26/2021   Lab Results  Component Value Date   TSH 0.913 03/26/2021   Lab Results  Component Value Date   CHOL 188 08/05/2021   HDL 56 08/05/2021   LDLCALC 121 (H) 08/05/2021   TRIG 59 08/05/2021   CHOLHDL 3 05/28/2020   Lab Results  Component Value Date   VD25OH 72.4 08/05/2021   VD25OH 25.8 (L) 03/26/2021   Lab Results  Component Value Date   WBC 5.1 04/03/2019   HGB 13.1 04/03/2019   HCT 39.0 04/03/2019   MCV 89.6 04/03/2019   PLT 332.0 04/03/2019   Lab Results  Component Value Date   IRON 106 10/30/2012  Attestation Statements:   Reviewed by clinician on day of visit: allergies, medications, problem list, medical history, surgical history, family history, social history, and previous encounter notes.  Time spent on visit including pre-visit chart review and post-visit care and charting was 30 minutes.   I, Amber Stephens, RMA, am acting as transcriptionist for Amber Pacific, FNP.  I have reviewed the above documentation for accuracy and completeness, and I agree with the above. Amber Pacific, FNP

## 2022-02-10 ENCOUNTER — Encounter: Payer: Self-pay | Admitting: Nurse Practitioner

## 2022-02-10 ENCOUNTER — Ambulatory Visit: Payer: 59 | Admitting: Nurse Practitioner

## 2022-02-10 VITALS — BP 123/83 | HR 90 | Temp 97.8°F | Ht 62.0 in | Wt 184.0 lb

## 2022-02-10 DIAGNOSIS — Z6833 Body mass index (BMI) 33.0-33.9, adult: Secondary | ICD-10-CM | POA: Diagnosis not present

## 2022-02-10 DIAGNOSIS — E669 Obesity, unspecified: Secondary | ICD-10-CM | POA: Diagnosis not present

## 2022-02-10 DIAGNOSIS — E559 Vitamin D deficiency, unspecified: Secondary | ICD-10-CM | POA: Diagnosis not present

## 2022-02-10 MED ORDER — VITAMIN D (ERGOCALCIFEROL) 1.25 MG (50000 UNIT) PO CAPS: 50000.0000 [IU] | ORAL_CAPSULE | ORAL | 0 refills | Status: DC

## 2022-02-10 NOTE — Patient Instructions (Signed)

## 2022-02-15 ENCOUNTER — Other Ambulatory Visit: Payer: Self-pay | Admitting: Nurse Practitioner

## 2022-02-15 DIAGNOSIS — R638 Other symptoms and signs concerning food and fluid intake: Secondary | ICD-10-CM

## 2022-02-17 NOTE — Progress Notes (Signed)
Chief Complaint:   OBESITY Amber Stephens is here to discuss her progress with her obesity treatment plan along with follow-up of her obesity related diagnoses. Amber Stephens is on keeping a food journal and adhering to recommended goals of 1200-1300 calories and 80 protein and states she is following her eating plan approximately 80-85% of the time. Amber Stephens states she is strength training/walking 60 minutes 2-3 times per week.  Today's visit was #: 77 Starting weight: 205 lbs Starting date: 03/26/2021 Today's weight: 184 lbs Today's date: 02/10/2022 Total lbs lost to date: 21 lbs Total lbs lost since last in-office visit: 0  Interim History: Amber Stephens has done well overall with weight loss.  Was surprised she maintained.  Thought she gained weight.  Plans to stop sugar/sweets for the month of January.  Drinking more water.    Subjective:   1. Vitamin D insufficiency Amber Stephens is currently taking prescription Vit D 50,000 IU once every 2 weeks. Denies any side effects.  Denies nausea, vomiting or muscle weakness.   Assessment/Plan:   1. Vitamin D insufficiency We will refill Vit D 50K IU once a week for 3 months with 0 refills.  Side effects discussed.    Low Vitamin D level contributes to fatigue and are associated with obesity, breast, and colon cancer. She agrees to continue to take prescription Vitamin D '@50'$ ,000 IU every week and will follow-up for routine testing of Vitamin D, at least 2-3 times per year to avoid over-replacement.   -Refill Vitamin D, Ergocalciferol, (DRISDOL) 1.25 MG (50000 UNIT) CAPS capsule; Take 1 capsule (50,000 Units total) by mouth every 14 (fourteen) days.  Dispense: 6 capsule; Refill: 0  2. Obesity, with current BMI of 33.7 Amber Stephens is currently in the action stage of change. As such, her goal is to continue with weight loss efforts. She has agreed to the Category 2 Plan.   Exercise goals: As is.  Behavioral modification strategies: increasing lean protein intake,  increasing water intake, and meal planning and cooking strategies.  Amber Stephens has agreed to follow-up with our clinic in 3 weeks. She was informed of the importance of frequent follow-up visits to maximize her success with intensive lifestyle modifications for her multiple health conditions.   Objective:   Blood pressure 123/83, pulse 90, temperature 97.8 F (36.6 C), height '5\' 2"'$  (1.575 m), weight 184 lb (83.5 kg), last menstrual period 06/04/2017, SpO2 98 %. Body mass index is 33.65 kg/m.  General: Cooperative, alert, well developed, in no acute distress. HEENT: Conjunctivae and lids unremarkable. Cardiovascular: Regular rhythm.  Lungs: Normal work of breathing. Neurologic: No focal deficits.   Lab Results  Component Value Date   CREATININE 0.99 08/05/2021   BUN 18 08/05/2021   NA 141 08/05/2021   K 4.5 08/05/2021   CL 103 08/05/2021   CO2 24 08/05/2021   Lab Results  Component Value Date   ALT 16 08/05/2021   AST 14 08/05/2021   ALKPHOS 58 08/05/2021   BILITOT 0.3 08/05/2021   Lab Results  Component Value Date   HGBA1C 5.5 08/05/2021   HGBA1C 5.7 (H) 03/26/2021   Lab Results  Component Value Date   INSULIN 11.3 08/05/2021   INSULIN 19.1 03/26/2021   Lab Results  Component Value Date   TSH 0.913 03/26/2021   Lab Results  Component Value Date   CHOL 188 08/05/2021   HDL 56 08/05/2021   LDLCALC 121 (H) 08/05/2021   TRIG 59 08/05/2021   CHOLHDL 3 05/28/2020   Lab Results  Component Value Date   VD25OH 72.4 08/05/2021   VD25OH 25.8 (L) 03/26/2021   Lab Results  Component Value Date   WBC 5.1 04/03/2019   HGB 13.1 04/03/2019   HCT 39.0 04/03/2019   MCV 89.6 04/03/2019   PLT 332.0 04/03/2019   Lab Results  Component Value Date   IRON 106 10/30/2012   Attestation Statements:   Reviewed by clinician on day of visit: allergies, medications, problem list, medical history, surgical history, family history, social history, and previous encounter  notes.  I, Brendell Tyus, RMA, am acting as transcriptionist for Everardo Pacific, FNP.  I have reviewed the above documentation for accuracy and completeness, and I agree with the above. Everardo Pacific, FNP

## 2022-02-22 ENCOUNTER — Encounter: Payer: Self-pay | Admitting: Family Medicine

## 2022-02-22 ENCOUNTER — Telehealth: Payer: 59 | Admitting: Family Medicine

## 2022-02-22 DIAGNOSIS — R6889 Other general symptoms and signs: Secondary | ICD-10-CM | POA: Diagnosis not present

## 2022-02-22 MED ORDER — BENZONATATE 100 MG PO CAPS: 100.0000 mg | ORAL_CAPSULE | Freq: Three times a day (TID) | ORAL | 0 refills | Status: DC | PRN

## 2022-02-22 MED ORDER — PSEUDOEPH-BROMPHEN-DM 30-2-10 MG/5ML PO SYRP: 5.0000 mL | ORAL_SOLUTION | Freq: Four times a day (QID) | ORAL | 0 refills | Status: DC | PRN

## 2022-02-22 MED ORDER — OSELTAMIVIR PHOSPHATE 75 MG PO CAPS: 75.0000 mg | ORAL_CAPSULE | Freq: Two times a day (BID) | ORAL | 0 refills | Status: AC

## 2022-02-22 NOTE — Patient Instructions (Addendum)
Amber Stephens, thank you for joining [name], NP for today's virtual visit.  While this provider is not your primary care provider (PCP), if your PCP is located in our provider database this encounter information will be shared with them immediately following your visit.   [facility] account gives you access to today's visit and all your visits, tests, and labs performed at [facility] " click here if you don't have a [facility] account or go to [email or web address]  Consent: (Patient) Amber Stephens provided verbal consent for this virtual visit at the beginning of the encounter.  Current Medications:  Current Outpatient Medications:    albuterol (PROAIR HFA) 108 (90 Base) MCG/ACT inhaler, USE 1-2 PUFFS BY MOUTH EVERY 4 HOURS AS NEEDED wheezing and shortness of breath, Disp: 3 each, Rfl: 0   amLODipine (NORVASC) 5 MG tablet, TAKE 1 TABLET BY MOUTH EVERY DAY, Disp: 90 tablet, Rfl: 1   benzonatate (TESSALON) 100 MG capsule, Take 1 capsule (100 mg total) by mouth 3 (three) times daily as needed for cough., Disp: 20 capsule, Rfl: 0   brompheniramine-pseudoephedrine-DM 30-2-10 MG/5ML syrup, Take 5 mLs by mouth 4 (four) times daily as needed., Disp: 120 mL, Rfl: 0   buPROPion (WELLBUTRIN SR) 200 MG 12 hr tablet, Take 1 tablet (200 mg total) by mouth 2 (two) times daily., Disp: 180 tablet, Rfl: 0   estradiol (VIVELLE-DOT) 0.1 MG/24HR patch, Place 1 patch onto the skin 2 (two) times a week., Disp: , Rfl:    fluticasone (FLONASE) 50 MCG/ACT nasal spray, Place into both nostrils daily., Disp: , Rfl:    montelukast (SINGULAIR) 10 MG tablet, TAKE 1 TABLET BY MOUTH EVERYDAY AT BEDTIME, Disp: 90 tablet, Rfl: 1   oseltamivir (TAMIFLU) 75 MG capsule, Take 1 capsule (75 mg total) by mouth 2 (two) times daily for 5 days., Disp: 10 capsule, Rfl: 0   Vitamin D, Ergocalciferol, (DRISDOL) 1.25 MG (50000 UNIT) CAPS capsule, Take 1 capsule (50,000 Units total) by  mouth every 14 (fourteen) days., Disp: 6 capsule, Rfl: 0   Medications ordered in this encounter:  Meds ordered this encounter  Medications   oseltamivir (TAMIFLU) 75 MG capsule    Sig: Take 1 capsule (75 mg total) by mouth 2 (two) times daily for 5 days.    Dispense:  10 capsule    Refill:  0    Order Specific Question:   Supervising Provider    Answer:   Chase Picket [[id number]]   benzonatate (TESSALON) 100 MG capsule    Sig: Take 1 capsule (100 mg total) by mouth 3 (three) times daily as needed for cough.    Dispense:  20 capsule    Refill:  0    Order Specific Question:   Supervising Provider    Answer:   Chase Picket [id number]   brompheniramine-pseudoephedrine-DM 30-2-10 MG/5ML syrup    Sig: Take 5 mLs by mouth 4 (four) times daily as needed.    Dispense:  120 mL    Refill:  0    Order Specific Question:   Supervising Provider    Answer:   Chase Picket [[id number]]     *If you need refills on other medications prior to your next appointment, please contact your pharmacy*  Follow-Up: Call back or seek an in-person evaluation if the symptoms worsen or if the condition fails to improve as anticipated.  [place] [phone]  Other Instructions  - Continue OTC symptomatic  management of choice - Push fluids - Rest as needed  Follow up if asthma flares or you get worse  If you have been instructed to have an in-person evaluation today at a local Urgent Care facility, please use the link below. It will take you to a list of all of our available [facility], including address, phone number and hours of operation. Please do not delay care.  [facility]  If you or a family member do not have a primary care provider, use the link below to schedule a visit and establish care. When you choose a [facility] primary care physician or advanced practice provider, you gain a long-term partner in health. Find a Primary Care  Provider  Learn more about [facility] in-office and virtual care options: [facility]

## 2022-02-22 NOTE — Progress Notes (Signed)
Virtual Visit Consent   Amber Stephens, you are scheduled for a virtual visit with a [facility] provider today. Just as with appointments in the office, your consent must be obtained to participate. Your consent will be active for this visit and any virtual visit you may have with one of our providers in the next 365 days. If you have a MyChart account, a copy of this consent can be sent to you electronically.  As this is a virtual visit, video technology does not allow for your provider to perform a traditional examination. This may limit your provider's ability to fully assess your condition. If your provider identifies any concerns that need to be evaluated in person or the need to arrange testing (such as labs, EKG, etc.), we will make arrangements to do so. Although advances in technology are sophisticated, we cannot ensure that it will always work on either your end or our end. If the connection with a video visit is poor, the visit may have to be switched to a telephone visit. With either a video or telephone visit, we are not always able to ensure that we have a secure connection.  By engaging in this virtual visit, you consent to the provision of healthcare and authorize for your insurance to be billed (if applicable) for the services provided during this visit. Depending on your insurance coverage, you may receive a charge related to this service.  I need to obtain your verbal consent now. Are you willing to proceed with your visit today? [name] has provided verbal consent on 02/22/2022 for a virtual visit (video or telephone). [name], NP  Date: 02/22/2022 1:02 PM  Virtual Visit via Video Note   I, [name], connected with  Amber Stephens  ([id number], [birth year]) on 02/22/22 at  1:00 PM EST by a video-enabled telemedicine application and verified that I am speaking with the correct person using two identifiers.  Location: Patient: Virtual Visit Location  Patient: Home Provider: Virtual Visit Location Provider: Home Office   I discussed the limitations of evaluation and management by telemedicine and the availability of in person appointments. The patient expressed understanding and agreed to proceed.    History of Present Illness: Amber Stephens is a 47 y.o. who identifies as a female who was assigned female at birth, and is being seen today for cough and congestion- started on Saturday. Mild Sore throat. Denies ear pain, fevers, chills, chest pain, shortness of breath. Covid Negative Unsure of sick contacts. Does have history of asthma.   Problems:  Patient Active Problem List   Diagnosis Date Noted   Abnormal craving 10/05/2021   Other hyperlipidemia 08/05/2021   Prediabetes 03/30/2021   Vitamin D insufficiency 03/30/2021   Class 2 severe obesity with serious comorbidity and body mass index (BMI) of 37.0 to 37.9 in adult [facility]) 12/03/2020   Hypertension 05/28/2020   S/P TAH (total abdominal hysterectomy) 06/16/2017   Physical exam, annual 10/30/2012   Allergic rhinitis 05/15/2007   Asthma 05/15/2007    Allergies:  Allergies  Allergen Reactions   Other     Almonds   Medications:  Current Outpatient Medications:    albuterol (PROAIR HFA) 108 (90 Base) MCG/ACT inhaler, USE 1-2 PUFFS BY MOUTH EVERY 4 HOURS AS NEEDED wheezing and shortness of breath, Disp: 3 each, Rfl: 0   amLODipine (NORVASC) 5 MG tablet, TAKE 1 TABLET BY MOUTH EVERY DAY, Disp: 90 tablet, Rfl: 1   buPROPion (WELLBUTRIN SR) 200  MG 12 hr tablet, Take 1 tablet (200 mg total) by mouth 2 (two) times daily., Disp: 180 tablet, Rfl: 0   estradiol (VIVELLE-DOT) 0.1 MG/24HR patch, Place 1 patch onto the skin 2 (two) times a week., Disp: , Rfl:    fluticasone (FLONASE) 50 MCG/ACT nasal spray, Place into both nostrils daily., Disp: , Rfl:    montelukast (SINGULAIR) 10 MG tablet, TAKE 1 TABLET BY MOUTH EVERYDAY AT BEDTIME, Disp: 90 tablet, Rfl: 1   Vitamin D,  Ergocalciferol, (DRISDOL) 1.25 MG (50000 UNIT) CAPS capsule, Take 1 capsule (50,000 Units total) by mouth every 14 (fourteen) days., Disp: 6 capsule, Rfl: 0  Observations/Objective: Patient is well-developed, well-nourished in no acute distress.  Resting comfortably  at home.  Head is normocephalic, atraumatic.  No labored breathing.  Speech is clear and coherent with logical content.  Patient is alert and oriented at baseline.    Assessment and Plan:  1. Flu-like symptoms  - oseltamivir (TAMIFLU) 75 MG capsule; Take 1 capsule (75 mg total) by mouth 2 (two) times daily for 5 days.  Dispense: 10 capsule; Refill: 0 - benzonatate (TESSALON) 100 MG capsule; Take 1 capsule (100 mg total) by mouth 3 (three) times daily as needed for cough.  Dispense: 20 capsule; Refill: 0 - brompheniramine-pseudoephedrine-DM 30-2-10 MG/5ML syrup; Take 5 mLs by mouth 4 (four) times daily as needed.  Dispense: 120 mL; Refill: 0   - Continue OTC symptomatic management of choice  - Take meds prescribed  - Push fluids - Rest as needed - Discussed return precautions and when to seek in-person evaluation, sent via AVS as well   Reviewed side effects, risks and benefits of medication.    Patient acknowledged agreement and understanding of the plan.   Past Medical, Surgical, Social History, Allergies, and Medications have been Reviewed.    Follow Up Instructions: I discussed the assessment and treatment plan with the patient. The patient was provided an opportunity to ask questions and all were answered. The patient agreed with the plan and demonstrated an understanding of the instructions.  A copy of instructions were sent to the patient via MyChart unless otherwise noted below.    The patient was advised to call back or seek an in-person evaluation if the symptoms worsen or if the condition fails to improve as anticipated.  Time:  I spent 7 minutes with the patient via telehealth technology discussing  the above problems/concerns.    [name], NP

## 2022-03-03 ENCOUNTER — Ambulatory Visit: Payer: 59 | Admitting: Nurse Practitioner

## 2022-03-03 ENCOUNTER — Encounter: Payer: Self-pay | Admitting: Nurse Practitioner

## 2022-03-03 VITALS — BP 127/84 | HR 82 | Temp 98.1°F | Ht 62.0 in | Wt 181.0 lb

## 2022-03-03 DIAGNOSIS — E669 Obesity, unspecified: Secondary | ICD-10-CM

## 2022-03-03 DIAGNOSIS — E559 Vitamin D deficiency, unspecified: Secondary | ICD-10-CM

## 2022-03-03 DIAGNOSIS — E7849 Other hyperlipidemia: Secondary | ICD-10-CM

## 2022-03-03 DIAGNOSIS — Z6833 Body mass index (BMI) 33.0-33.9, adult: Secondary | ICD-10-CM

## 2022-03-03 DIAGNOSIS — I10 Essential (primary) hypertension: Secondary | ICD-10-CM

## 2022-03-03 DIAGNOSIS — R051 Acute cough: Secondary | ICD-10-CM

## 2022-03-03 DIAGNOSIS — R7303 Prediabetes: Secondary | ICD-10-CM | POA: Diagnosis not present

## 2022-03-03 MED ORDER — BENZONATATE 100 MG PO CAPS: 100.0000 mg | ORAL_CAPSULE | Freq: Two times a day (BID) | ORAL | 0 refills | Status: DC | PRN

## 2022-03-04 LAB — CBC WITH DIFFERENTIAL/PLATELET
Basophils Absolute: 0.1 10*3/uL (ref 0.0–0.2)
Basos: 1 %
EOS (ABSOLUTE): 0.3 10*3/uL (ref 0.0–0.4)
Eos: 4 %
Hematocrit: 41.1 % (ref 34.0–46.6)
Hemoglobin: 13.6 g/dL (ref 11.1–15.9)
Immature Grans (Abs): 0 10*3/uL (ref 0.0–0.1)
Immature Granulocytes: 0 %
Lymphocytes Absolute: 2.2 10*3/uL (ref 0.7–3.1)
Lymphs: 26 %
MCH: 29.1 pg (ref 26.6–33.0)
MCHC: 33.1 g/dL (ref 31.5–35.7)
MCV: 88 fL (ref 79–97)
Monocytes Absolute: 0.7 10*3/uL (ref 0.1–0.9)
Monocytes: 8 %
Neutrophils Absolute: 5.2 10*3/uL (ref 1.4–7.0)
Neutrophils: 61 %
Platelets: 429 10*3/uL (ref 150–450)
RBC: 4.68 x10E6/uL (ref 3.77–5.28)
RDW: 12.6 % (ref 11.7–15.4)
WBC: 8.4 10*3/uL (ref 3.4–10.8)

## 2022-03-04 LAB — LIPID PANEL WITH LDL/HDL RATIO
Cholesterol, Total: 164 mg/dL (ref 100–199)
HDL: 47 mg/dL (ref >39)
LDL Chol Calc (NIH): 106 mg/dL — ABNORMAL HIGH (ref 0–99)
LDL/HDL Ratio: 2.3 ratio (ref 0.0–3.2)
Triglycerides: 53 mg/dL (ref 0–149)
VLDL Cholesterol Cal: 11 mg/dL (ref 5–40)

## 2022-03-04 LAB — COMPREHENSIVE METABOLIC PANEL
ALT: 21 IU/L (ref 0–32)
AST: 16 IU/L (ref 0–40)
Albumin/Globulin Ratio: 1.7 (ref 1.2–2.2)
Albumin: 4.5 g/dL (ref 3.9–4.9)
Alkaline Phosphatase: 59 IU/L (ref 44–121)
BUN/Creatinine Ratio: 9 (ref 9–23)
BUN: 10 mg/dL (ref 6–24)
Bilirubin Total: 0.3 mg/dL (ref 0.0–1.2)
CO2: 24 mmol/L (ref 20–29)
Calcium: 9.5 mg/dL (ref 8.7–10.2)
Chloride: 102 mmol/L (ref 96–106)
Creatinine, Ser: 1.07 mg/dL — ABNORMAL HIGH (ref 0.57–1.00)
Globulin, Total: 2.6 g/dL (ref 1.5–4.5)
Glucose: 89 mg/dL (ref 70–99)
Potassium: 4.9 mmol/L (ref 3.5–5.2)
Sodium: 139 mmol/L (ref 134–144)
Total Protein: 7.1 g/dL (ref 6.0–8.5)
eGFR: 64 mL/min/{1.73_m2} (ref >59)

## 2022-03-04 LAB — INSULIN, RANDOM: INSULIN: 8.4 u[IU]/mL (ref 2.6–24.9)

## 2022-03-04 LAB — HEMOGLOBIN A1C
Est. average glucose Bld gHb Est-mCnc: 114 mg/dL
Hgb A1c MFr Bld: 5.6 % (ref 4.8–5.6)

## 2022-03-04 LAB — VITAMIN D 25 HYDROXY (VIT D DEFICIENCY, FRACTURES): Vit D, 25-Hydroxy: 83.1 ng/mL (ref 30.0–100.0)

## 2022-03-11 NOTE — Progress Notes (Signed)
Chief Complaint:   OBESITY Amber Stephens is here to discuss her progress with her obesity treatment plan along with follow-up of her obesity related diagnoses. Amber Stephens is on the Category 2 Plan and states she is following her eating plan approximately 80-90% of the time. Amber Stephens states she is exercising 0 minutes 0 times per week.  Today's visit was #: 20 Starting weight: 205 lbs Starting date: 03/26/2021 Today's weight: 181 lbs Today's date: 03/03/2022 Total lbs lost to date: 24 lbs Total lbs lost since last in-office visit: 3  Interim History: [name] has done well with weight loss.  She had the flu last week.  Since her last visit she has started eating breakfast and meal preps lunch.  Has decreased snacking and is drinking water daily.  For the month of January she has avoided sweets and plans to continue to avoid sweets.    Subjective:   1. Acute cough Amber Stephens had the flu last week and is overall doing better for a lingering cough.  Denies wheezing, SOB, fever, chill, nausea and chills.    2. Other hyperlipidemia Never been on medications.  Denies family history of HDL.  3. Vitamin D insufficiency Amber Stephens is currently taking prescription Vit D 50,000 IU once every two week.   Denies any nausea, vomiting or muscle weakness.  4. Pre-diabetes Currently not on medications.  Not interested in taking Metformin.  5. Primary hypertension Taking Norvasc 5 mg.  Denies any side effects.  Denies any chest pain,shortness of breath or palpitations.   Assessment/Plan:   1. Acute cough Will refill Tessalon 100 mg twice a day as needed.  Side effects discussed.   -Refill benzonatate (TESSALON) 100 MG capsule; Take 1 capsule (100 mg total) by mouth 2 (two) times daily as needed for cough.  Dispense: 20 capsule; Refill: 0  2. Other hyperlipidemia We will obtain labs today.  - Comprehensive metabolic panel - Lipid Panel With LDL/HDL Ratio  3. Vitamin D insufficiency We will obtain labs  today.  - Comprehensive metabolic panel - VITAMIN D 25 Hydroxy (Vit-D Deficiency, Fractures)  4. Pre-diabetes We will obtain labs today.  - Hemoglobin A1c - Insulin, random  5. Primary hypertension Continue to follow up with PCP.  Continue medication as directed.  6. Generalized obesity  7. BMI 33.0-33.9,adult Amber Stephens is currently in the action stage of change. As such, her goal is to continue with weight loss efforts. She has agreed to the Category 2 Plan.   Exercise goals: All adults should avoid inactivity. Some physical activity is better than none, and adults who participate in any amount of physical activity gain some health benefits.  Behavioral modification strategies: increasing lean protein intake, increasing vegetables, increasing water intake, and planning for success.  Amber Stephens has agreed to follow-up with our clinic in 2 weeks. She was informed of the importance of frequent follow-up visits to maximize her success with intensive lifestyle modifications for her multiple health conditions.   Amber Stephens was informed we would discuss her lab results at her next visit unless there is a critical issue that needs to be addressed sooner. Amber Stephens agreed to keep her next visit at the agreed upon time to discuss these results.  Objective:   Blood pressure 127/84, pulse 82, temperature 98.1 F (36.7 C), height '5\' 2"'$  (1.575 m), weight 181 lb (82.1 kg), last menstrual period 06/04/2017, SpO2 98 %. Body mass index is 33.11 kg/m.  General: Cooperative, alert, well developed, in no acute distress. HEENT: Conjunctivae and lids  unremarkable. Cardiovascular: Regular rhythm.  Lungs: Normal work of breathing. Neurologic: No focal deficits.   Lab Results  Component Value Date   CREATININE 1.07 (H) 03/03/2022   BUN 10 03/03/2022   NA 139 03/03/2022   K 4.9 03/03/2022   CL 102 03/03/2022   CO2 24 03/03/2022   Lab Results  Component Value Date   ALT 21 03/03/2022   AST 16  03/03/2022   ALKPHOS 59 03/03/2022   BILITOT 0.3 03/03/2022   Lab Results  Component Value Date   HGBA1C 5.6 03/03/2022   HGBA1C 5.5 08/05/2021   HGBA1C 5.7 (H) 03/26/2021   Lab Results  Component Value Date   INSULIN 8.4 03/03/2022   INSULIN 11.3 08/05/2021   INSULIN 19.1 03/26/2021   Lab Results  Component Value Date   TSH 0.913 03/26/2021   Lab Results  Component Value Date   CHOL 164 03/03/2022   HDL 47 03/03/2022   LDLCALC 106 (H) 03/03/2022   TRIG 53 03/03/2022   CHOLHDL 3 05/28/2020   Lab Results  Component Value Date   VD25OH 83.1 03/03/2022   VD25OH 72.4 08/05/2021   VD25OH 25.8 (L) 03/26/2021   Lab Results  Component Value Date   WBC 8.4 03/03/2022   HGB 13.6 03/03/2022   HCT 41.1 03/03/2022   MCV 88 03/03/2022   PLT 429 03/03/2022   Lab Results  Component Value Date   IRON 106 10/30/2012   Attestation Statements:   Reviewed by clinician on day of visit: allergies, medications, problem list, medical history, surgical history, family history, social history, and previous encounter notes.  I, Brendell Tyus, RMA, am acting as transcriptionist for Everardo Pacific, FNP.  I have reviewed the above documentation for accuracy and completeness, and I agree with the above. Everardo Pacific, FNP

## 2022-03-17 ENCOUNTER — Ambulatory Visit: Payer: 59 | Admitting: Nurse Practitioner

## 2022-03-17 ENCOUNTER — Encounter: Payer: Self-pay | Admitting: Nurse Practitioner

## 2022-03-17 VITALS — BP 121/87 | HR 77 | Temp 98.4°F | Ht 62.0 in | Wt 183.0 lb

## 2022-03-17 DIAGNOSIS — E7849 Other hyperlipidemia: Secondary | ICD-10-CM | POA: Diagnosis not present

## 2022-03-17 DIAGNOSIS — Z6833 Body mass index (BMI) 33.0-33.9, adult: Secondary | ICD-10-CM | POA: Diagnosis not present

## 2022-03-17 DIAGNOSIS — E669 Obesity, unspecified: Secondary | ICD-10-CM

## 2022-03-17 DIAGNOSIS — E559 Vitamin D deficiency, unspecified: Secondary | ICD-10-CM

## 2022-03-17 NOTE — Progress Notes (Signed)
Office: [phone]  /  Fax: [phone]  WEIGHT SUMMARY AND BIOMETRICS  Medical Weight Loss Height: 5' 2"$  (1.575 m) Weight: 183 lb (83 kg) Temp: 98.4 F (36.9 C) Pulse Rate: 77 BP: 121/87 SpO2: 100 % Today's Visit #: 21 Weight at Last VIsit: 181lb Weight Lost Since Last Visit: 0lb  Body Fat %: 34.5 % Fat Mass (lbs): 63.4 lbs Muscle Mass (lbs): 114.2 lbs Total Body Water (lbs): 74.8 lbs Visceral Fat Rating : 8 Starting Date: 03/26/21 Starting Weight: 205lb Total Weight Loss (lbs): 22 lb (9.979 kg)    HPI  Chief Complaint: OBESITY  Amber Stephens is here to discuss her progress with her obesity treatment plan. She is on the the Category 2 Plan and states she is following her eating plan approximately 70 % of the time. She states she is exercising 30 mins to 1 hr 45 minutes 3 days per week.   Interval History:  Since last office visit she has gained 2 lbs.  She is under more stress at work and has been stress eating.  She celebrated [employer].  She has gotten off track but plans to get back on track.  Works hard to meet protein goals.  Restarted eating sweets since her last and now is craving sweets.  Plans to start walking on her treadmill.  She is going to work out with her Physiological scientist one day per week.  Drinking water daily.     Pharmacotherapy for weight loss: she is currently taking Wellbutrin SR 29m BID for cravings.  Denies side effects.    Previous pharmacotherapy for medical weight loss:   she has not tried weight loss medications in the past.     PHYSICAL EXAM:  Blood pressure 121/87, pulse 77, temperature 98.4 F (36.9 C), height 5' 2"$  (1.575 m), weight 183 lb (83 kg), last menstrual period 06/04/2017, SpO2 100 %. Body mass index is 33.47 kg/m.  General: She is overweight, cooperative, alert, well developed, and in no acute distress. PSYCH: Has normal mood, affect and thought process.   Extremities: No edema.  Neurologic: No gross sensory or motor  deficits. No tremors or fasciculations noted.    DIAGNOSTIC DATA REVIEWED:  BMET    Component Value Date/Time   NA 139 03/03/2022 1003   K 4.9 03/03/2022 1003   CL 102 03/03/2022 1003   CO2 24 03/03/2022 1003   GLUCOSE 89 03/03/2022 1003   GLUCOSE 89 05/28/2020 0831   BUN 10 03/03/2022 1003   CREATININE 1.07 (H) 03/03/2022 1003   CALCIUM 9.5 03/03/2022 1003   GFRNONAA >60 06/07/2017 0915   GFRAA >60 06/07/2017 0915   Lab Results  Component Value Date   HGBA1C 5.6 03/03/2022   HGBA1C 5.7 (H) 03/26/2021   Lab Results  Component Value Date   INSULIN 8.4 03/03/2022   INSULIN 19.1 03/26/2021   Lab Results  Component Value Date   TSH 0.913 03/26/2021   CBC    Component Value Date/Time   WBC 8.4 03/03/2022 1003   WBC 5.1 04/03/2019 1005   RBC 4.68 03/03/2022 1003   RBC 4.36 04/03/2019 1005   HGB 13.6 03/03/2022 1003   HCT 41.1 03/03/2022 1003   PLT 429 03/03/2022 1003   MCV 88 03/03/2022 1003   MCH 29.1 03/03/2022 1003   MCH 28.8 06/17/2017 0443   MCHC 33.1 03/03/2022 1003   MCHC 33.6 04/03/2019 1005   RDW 12.6 03/03/2022 1003   Iron Studies    Component Value Date/Time  IRON 106 10/30/2012 1137   IRONPCTSAT 28.6 10/30/2012 1137   Lipid Panel     Component Value Date/Time   CHOL 164 03/03/2022 1003   TRIG 53 03/03/2022 1003   HDL 47 03/03/2022 1003   CHOLHDL 3 05/28/2020 0831   VLDL 11.4 05/28/2020 0831   LDLCALC 106 (H) 03/03/2022 1003   Hepatic Function Panel     Component Value Date/Time   PROT 7.1 03/03/2022 1003   ALBUMIN 4.5 03/03/2022 1003   AST 16 03/03/2022 1003   ALT 21 03/03/2022 1003   ALKPHOS 59 03/03/2022 1003   BILITOT 0.3 03/03/2022 1003   BILIDIR 0.1 04/03/2019 1005      Component Value Date/Time   TSH 0.913 03/26/2021 0829   Nutritional Lab Results  Component Value Date   VD25OH 83.1 03/03/2022   VD25OH 72.4 08/05/2021   VD25OH 25.8 (L) 03/26/2021     ASSESSMENT AND PLAN  TREATMENT PLAN FOR OBESITY:  Recommended  Dietary Goals  Nizhoni is currently in the action stage of change. As such, her goal is to continue weight management plan. She has agreed to the Category 2 Plan.  Behavioral Intervention  We discussed the following Behavioral Modification Strategies today: increasing lean protein intake, decreasing simple carbohydrates , increasing vegetables, increase water intake, work on meal planning and easy cooking plans, and think about ways to increase physical activity.  Additional resources provided today: NA  Recommended Physical Activity Goals  Aamiyah has been advised to work up to 150 minutes of moderate intensity aerobic activity a week and strengthening exercises 2-3 times per week for cardiovascular health, weight loss maintenance and preservation of muscle mass.   She has agreed to continue physical activity as is.    Pharmacotherapy We discussed various medication options to help Jamoni with her weight loss efforts and we both agreed to continue Wellbutrin. Did discuss topamax and Qsymia.  Handout given.  ASSOCIATED CONDITIONS ADDRESSED TODAY  Action/Plan  Vitamin D insufficiency Taking Vit D 50,000 IU every 2 weeks.  Notes some nausea. Denies vomiting.  Will stop Vit D 50,000 IU and can start OTC Vit D 2,000 IU daily.  Labs reviewed with patient.  Other hyperlipidemia Labs discussed with patient today.   Cholesterol looks better.  Not on meds.  FH: sister with HLD The 10-year ASCVD risk score (Arnett DK, et al., 2019) is: 2.3%   Values used to calculate the score:     Age: 78 years     Sex: Female     Is Non-Hispanic African American: Yes     Diabetic: No     Tobacco smoker: No     Systolic Blood Pressure: 123XX123 mmHg     Is BP treated: Yes     HDL Cholesterol: 47 mg/dL     Total Cholesterol: 164 mg/dL  Cardiovascular risk and specific lipid/LDL goals reviewed.  We discussed several lifestyle modifications today and Annielee will continue to work on diet, exercise and  weight loss efforts. Orders and follow up as documented in patient record.   Counseling Intensive lifestyle modifications are the first line treatment for this issue. Dietary changes: Increase soluble fiber. Decrease simple carbohydrates. Exercise changes: Moderate to vigorous-intensity aerobic activity 150 minutes per week if tolerated. Lipid-lowering medications: see documented in medical record.   Generalized obesity  BMI 33.0-33.9,adult    Labs reviewed with patient from last visit today.  Creat was slightly elevated.  Will check TSH and BMP in March.      Return  in about 2 weeks (around 03/31/2022).Marland Kitchen She was informed of the importance of frequent follow up visits to maximize her success with intensive lifestyle modifications for her multiple health conditions.   ATTESTASTION STATEMENTS:  Reviewed by clinician on day of visit: allergies, medications, problem list, medical history, surgical history, family history, social history, and previous encounter notes.   Time spent on visit including pre-visit chart review and post-visit care and charting was 30 minutes.    Ailene Rud. Andru Genter FNP-C

## 2022-03-17 NOTE — Patient Instructions (Signed)
The 10-year ASCVD risk score (Arnett DK, et al., 2019) is: 2.3%   Values used to calculate the score:     Age: 47 years     Sex: Female     Is Non-Hispanic African American: Yes     Diabetic: No     Tobacco smoker: No     Systolic Blood Pressure: 123XX123 mmHg     Is BP treated: Yes     HDL Cholesterol: 47 mg/dL     Total Cholesterol: 164 mg/dL

## 2022-03-31 ENCOUNTER — Ambulatory Visit: Payer: 59 | Admitting: Nurse Practitioner

## 2022-03-31 ENCOUNTER — Encounter: Payer: Self-pay | Admitting: Nurse Practitioner

## 2022-03-31 VITALS — BP 123/82 | HR 73 | Temp 98.3°F | Ht 62.0 in | Wt 181.0 lb

## 2022-03-31 DIAGNOSIS — Z6833 Body mass index (BMI) 33.0-33.9, adult: Secondary | ICD-10-CM | POA: Diagnosis not present

## 2022-03-31 DIAGNOSIS — I10 Essential (primary) hypertension: Secondary | ICD-10-CM | POA: Diagnosis not present

## 2022-03-31 DIAGNOSIS — E669 Obesity, unspecified: Secondary | ICD-10-CM

## 2022-03-31 NOTE — Progress Notes (Signed)
Office: [phone]  /  Fax: [phone]  WEIGHT SUMMARY AND BIOMETRICS  Weight Lost Since Last Visit: 2lb  No data recorded  Vitals Temp: 98.3 F (36.8 C) BP: 123/82 Pulse Rate: 73 SpO2: 100 %   Anthropometric Measurements Height: '5\' 2"'$  (1.575 m) Weight: 181 lb (82.1 kg) BMI (Calculated): 33.1 Weight at Last Visit: 183lb Weight Lost Since Last Visit: 2lb Starting Weight: 205lb Total Weight Loss (lbs): 24 lb (10.9 kg)   Body Composition  Body Fat %: 34 % Fat Mass (lbs): 61.8 lbs Muscle Mass (lbs): 114 lbs Total Body Water (lbs): 74.6 lbs Visceral Fat Rating : 8   Other Clinical Data Today's Visit #: 22 Starting Date: 03/26/21     HPI  Chief Complaint: OBESITY  Amber Stephens is here to discuss her progress with her obesity treatment plan. She is on the category 2 plan and states she is following her eating plan approximately 70-80 % of the time. She states she is exercising 60 minutes 3 days per week.   Interval History:  She has struggled with her weight since she was a child and her weight increased when she got in high school  Since last office visit she has lost 2 pounds.  She is doing well with meal plan.  Stopped sugar since her last visit.  She notes that she overall does better with weight loss when she stops her sugar/sweets intake.  She is drinking water daily.   Goal weight 165 lbs.   Pharmacotherapy for weight loss: She is not currently taking medications  for medical weight loss.   Previous pharmacotherapy for medical weight loss:   none     Hypertension Hypertension stable.  Medication(s): Norvasc '5mg'$ .  Started BP meds when her weight increased after 180 lbs. We tried to decrease her med in previous visits but BP increased and she had to restart taking it.  Goal:  to stop Norvasc.  Will consider once her weight <180 lbs.  Denies chest pain, palpitations and SOB.  BP Readings from Last 3 Encounters:  03/31/22 123/82  03/17/22 121/87   03/03/22 127/84   Lab Results  Component Value Date   CREATININE 1.07 (H) 03/03/2022   CREATININE 0.99 08/05/2021   CREATININE 0.96 03/26/2021     PHYSICAL EXAM:  Blood pressure 123/82, pulse 73, temperature 98.3 F (36.8 C), height '5\' 2"'$  (1.575 m), weight 181 lb (82.1 kg), last menstrual period 06/04/2017, SpO2 100 %. Body mass index is 33.11 kg/m.  General: She is overweight, cooperative, alert, well developed, and in no acute distress. PSYCH: Has normal mood, affect and thought process.   Extremities: No edema.  Neurologic: No gross sensory or motor deficits. No tremors or fasciculations noted.    DIAGNOSTIC DATA REVIEWED:  BMET    Component Value Date/Time   NA 139 03/03/2022 1003   K 4.9 03/03/2022 1003   CL 102 03/03/2022 1003   CO2 24 03/03/2022 1003   GLUCOSE 89 03/03/2022 1003   GLUCOSE 89 05/28/2020 0831   BUN 10 03/03/2022 1003   CREATININE 1.07 (H) 03/03/2022 1003   CALCIUM 9.5 03/03/2022 1003   GFRNONAA >60 06/07/2017 0915   GFRAA >60 06/07/2017 0915   Lab Results  Component Value Date   HGBA1C 5.6 03/03/2022   HGBA1C 5.7 (H) 03/26/2021   Lab Results  Component Value Date   INSULIN 8.4 03/03/2022   INSULIN 19.1 03/26/2021   Lab Results  Component Value Date   TSH 0.913 03/26/2021   CBC  Component Value Date/Time   WBC 8.4 03/03/2022 1003   WBC 5.1 04/03/2019 1005   RBC 4.68 03/03/2022 1003   RBC 4.36 04/03/2019 1005   HGB 13.6 03/03/2022 1003   HCT 41.1 03/03/2022 1003   PLT 429 03/03/2022 1003   MCV 88 03/03/2022 1003   MCH 29.1 03/03/2022 1003   MCH 28.8 06/17/2017 0443   MCHC 33.1 03/03/2022 1003   MCHC 33.6 04/03/2019 1005   RDW 12.6 03/03/2022 1003   Iron Studies    Component Value Date/Time   IRON 106 10/30/2012 1137   IRONPCTSAT 28.6 10/30/2012 1137   Lipid Panel     Component Value Date/Time   CHOL 164 03/03/2022 1003   TRIG 53 03/03/2022 1003   HDL 47 03/03/2022 1003   CHOLHDL 3 05/28/2020 0831   VLDL 11.4  05/28/2020 0831   LDLCALC 106 (H) 03/03/2022 1003   Hepatic Function Panel     Component Value Date/Time   PROT 7.1 03/03/2022 1003   ALBUMIN 4.5 03/03/2022 1003   AST 16 03/03/2022 1003   ALT 21 03/03/2022 1003   ALKPHOS 59 03/03/2022 1003   BILITOT 0.3 03/03/2022 1003   BILIDIR 0.1 04/03/2019 1005      Component Value Date/Time   TSH 0.913 03/26/2021 0829   Nutritional Lab Results  Component Value Date   VD25OH 83.1 03/03/2022   VD25OH 72.4 08/05/2021   VD25OH 25.8 (L) 03/26/2021     ASSESSMENT AND PLAN  TREATMENT PLAN FOR OBESITY:  Recommended Dietary Goals  Rosia is currently in the action stage of change. As such, her goal is to continue weight management plan. She has agreed to the Category 2 Plan.  Behavioral Intervention  We discussed the following Behavioral Modification Strategies today: increasing lean protein intake, increasing vegetables, avoiding skipping meals, increasing water intake, and work on meal planning and easy cooking plans.  Additional resources provided today: NA  Recommended Physical Activity Goals  Ermal has been advised to work up to 150 minutes of moderate intensity aerobic activity a week and strengthening exercises 2-3 times per week for cardiovascular health, weight loss maintenance and preservation of muscle mass.   She has agreed to continue physical activity as is.     ASSOCIATED CONDITIONS ADDRESSED TODAY  Action/Plan  Primary hypertension Continue follow-up with PCP.  Continue medications as directed.  Will consider adjusting her amlodipine at a later date.  Davianna is working on healthy weight loss and exercise to improve blood pressure control. We will watch for signs of hypotension as she continues her lifestyle modifications.   Generalized obesity  BMI 33.0-33.9,adult      Goal: limit sugar in March.    Return in about 3 weeks (around 04/21/2022).Marland Kitchen She was informed of the importance of frequent follow up  visits to maximize her success with intensive lifestyle modifications for her multiple health conditions.   ATTESTASTION STATEMENTS:  Reviewed by clinician on day of visit: allergies, medications, problem list, medical history, surgical history, family history, social history, and previous encounter notes.   Time spent on visit including pre-visit chart review and post-visit care and charting was 30 minutes.    Ailene Rud. Novalie Leamy FNP-C

## 2022-04-04 ENCOUNTER — Other Ambulatory Visit: Payer: Self-pay | Admitting: Nurse Practitioner

## 2022-04-04 DIAGNOSIS — R638 Other symptoms and signs concerning food and fluid intake: Secondary | ICD-10-CM

## 2022-04-21 ENCOUNTER — Ambulatory Visit: Payer: 59 | Admitting: Nurse Practitioner

## 2022-04-21 ENCOUNTER — Encounter: Payer: Self-pay | Admitting: Nurse Practitioner

## 2022-04-21 VITALS — BP 126/88 | HR 63 | Temp 98.4°F | Ht 62.0 in | Wt 180.0 lb

## 2022-04-21 DIAGNOSIS — I10 Essential (primary) hypertension: Secondary | ICD-10-CM | POA: Diagnosis not present

## 2022-04-21 DIAGNOSIS — Z6832 Body mass index (BMI) 32.0-32.9, adult: Secondary | ICD-10-CM

## 2022-04-21 DIAGNOSIS — E669 Obesity, unspecified: Secondary | ICD-10-CM | POA: Diagnosis not present

## 2022-04-21 NOTE — Progress Notes (Signed)
Office: [phone]  /  Fax: [phone]  WEIGHT SUMMARY AND BIOMETRICS  Weight Lost Since Last Visit: 1lb  No data recorded  Vitals Temp: 98.4 F (36.9 C) BP: 126/88 Pulse Rate: 63 SpO2: 96 %   Anthropometric Measurements Height: 5\' 2"  (1.575 m) Weight: 180 lb (81.6 kg) BMI (Calculated): 32.91 Weight at Last Visit: 181lb Weight Lost Since Last Visit: 1lb Starting Weight: 205lb Total Weight Loss (lbs): 25 lb (11.3 kg)   Body Composition  Body Fat %: 32.9 % Fat Mass (lbs): 59.4 lbs Muscle Mass (lbs): 115.2 lbs Total Body Water (lbs): 73.2 lbs Visceral Fat Rating : 8   Other Clinical Data Today's Visit #: 23 Starting Date: 03/26/21     HPI  Chief Complaint: OBESITY  Amber Stephens is here to discuss her progress with her obesity treatment plan. She is on the the Category 2 Plan and states she is following her eating plan approximately 70-80 % of the time. She states she is exercising 30-40 minutes 3 days per week.   Interval History:  Since last office visit she has lost 1 pound.  Goal weight:  165 lbs.  Using lose it app.  Averaging around 1200-1500 calories and 58 grams of protein.  Has decreased sugar intake and is doing well.  Craving salt at night. She is drinking water daily.    Pharmacotherapy for weight loss: She is not currently taking medications  for medical weight loss.   Previous pharmacotherapy for medical weight loss:  None  Bariatric surgery:  Patient never had bariatric surgery  Hypertension She is taking Norvasc 5mg .  Started BP meds when her weight increased after 180 lbs. We tried to decrease her med in previous visits but BP increased and she had to restart taking it.  Goal:  to stop Norvasc.  Will consider once her weight <180 lbs.  Denies chest pain, palpitations and SOB.  BP Readings from Last 3 Encounters:  04/21/22 126/88  03/31/22 123/82  03/17/22 121/87   Lab Results  Component Value Date   CREATININE 1.07 (H) 03/03/2022    CREATININE 0.99 08/05/2021   CREATININE 0.96 03/26/2021     PHYSICAL EXAM:  Blood pressure 126/88, pulse 63, temperature 98.4 F (36.9 C), height 5\' 2"  (1.575 m), weight 180 lb (81.6 kg), last menstrual period 06/04/2017, SpO2 96 %. Body mass index is 32.92 kg/m.  General: She is overweight, cooperative, alert, well developed, and in no acute distress. PSYCH: Has normal mood, affect and thought process.   Extremities: No edema.  Neurologic: No gross sensory or motor deficits. No tremors or fasciculations noted.    DIAGNOSTIC DATA REVIEWED:  BMET    Component Value Date/Time   NA 139 03/03/2022 1003   K 4.9 03/03/2022 1003   CL 102 03/03/2022 1003   CO2 24 03/03/2022 1003   GLUCOSE 89 03/03/2022 1003   GLUCOSE 89 05/28/2020 0831   BUN 10 03/03/2022 1003   CREATININE 1.07 (H) 03/03/2022 1003   CALCIUM 9.5 03/03/2022 1003   GFRNONAA >60 06/07/2017 0915   GFRAA >60 06/07/2017 0915   Lab Results  Component Value Date   HGBA1C 5.6 03/03/2022   HGBA1C 5.7 (H) 03/26/2021   Lab Results  Component Value Date   INSULIN 8.4 03/03/2022   INSULIN 19.1 03/26/2021   Lab Results  Component Value Date   TSH 0.913 03/26/2021   CBC    Component Value Date/Time   WBC 8.4 03/03/2022 1003   WBC 5.1 04/03/2019 1005  RBC 4.68 03/03/2022 1003   RBC 4.36 04/03/2019 1005   HGB 13.6 03/03/2022 1003   HCT 41.1 03/03/2022 1003   PLT 429 03/03/2022 1003   MCV 88 03/03/2022 1003   MCH 29.1 03/03/2022 1003   MCH 28.8 06/17/2017 0443   MCHC 33.1 03/03/2022 1003   MCHC 33.6 04/03/2019 1005   RDW 12.6 03/03/2022 1003   Iron Studies    Component Value Date/Time   IRON 106 10/30/2012 1137   IRONPCTSAT 28.6 10/30/2012 1137   Lipid Panel     Component Value Date/Time   CHOL 164 03/03/2022 1003   TRIG 53 03/03/2022 1003   HDL 47 03/03/2022 1003   CHOLHDL 3 05/28/2020 0831   VLDL 11.4 05/28/2020 0831   LDLCALC 106 (H) 03/03/2022 1003   Hepatic Function Panel     Component  Value Date/Time   PROT 7.1 03/03/2022 1003   ALBUMIN 4.5 03/03/2022 1003   AST 16 03/03/2022 1003   ALT 21 03/03/2022 1003   ALKPHOS 59 03/03/2022 1003   BILITOT 0.3 03/03/2022 1003   BILIDIR 0.1 04/03/2019 1005      Component Value Date/Time   TSH 0.913 03/26/2021 0829   Nutritional Lab Results  Component Value Date   VD25OH 83.1 03/03/2022   VD25OH 72.4 08/05/2021   VD25OH 25.8 (L) 03/26/2021     ASSESSMENT AND PLAN  TREATMENT PLAN FOR OBESITY:  Recommended Dietary Goals  Amber Stephens is currently in the action stage of change. As such, her goal is to continue weight management plan. She has agreed to keeping a food journal and adhering to recommended goals of 1200 calories and 80+ protein. Will track using loseit app or myfitness pal.  Will review at next visit    Behavioral Intervention  We discussed the following Behavioral Modification Strategies today: increasing lean protein intake, decreasing simple carbohydrates , increasing vegetables, avoiding skipping meals, increasing water intake, work on meal planning and easy cooking plans, planning for success, and keeping healthy foods at home.  Additional resources provided today: NA  Recommended Physical Activity Goals  Amber Stephens has been advised to work up to 150 minutes of moderate intensity aerobic activity a week and strengthening exercises 2-3 times per week for cardiovascular health, weight loss maintenance and preservation of muscle mass.   She has agreed to Continue current level of physical activity    ASSOCIATED CONDITIONS ADDRESSED TODAY  Action/Plan  Primary hypertension Continue to follow up with PCP.  Continue meds as directed  Generalized obesity  BMI 32.0-32.9,adult  Will check TSH and CMP at next visit.    Return in about 2 weeks (around 05/05/2022).Marland Kitchen She was informed of the importance of frequent follow up visits to maximize her success with intensive lifestyle modifications for her multiple  health conditions.   ATTESTASTION STATEMENTS:  Reviewed by clinician on day of visit: allergies, medications, problem list, medical history, surgical history, family history, social history, and previous encounter notes.   Time spent on visit including pre-visit chart review and post-visit care and charting was 30 minutes.    Ailene Rud. Donyetta Ogletree FNP-C

## 2022-05-12 ENCOUNTER — Ambulatory Visit: Payer: 59 | Admitting: Nurse Practitioner

## 2022-05-12 ENCOUNTER — Encounter: Payer: Self-pay | Admitting: Nurse Practitioner

## 2022-05-12 VITALS — BP 127/84 | HR 61 | Temp 98.1°F | Ht 62.0 in | Wt 180.0 lb

## 2022-05-12 DIAGNOSIS — Z6832 Body mass index (BMI) 32.0-32.9, adult: Secondary | ICD-10-CM

## 2022-05-12 DIAGNOSIS — R748 Abnormal levels of other serum enzymes: Secondary | ICD-10-CM

## 2022-05-12 DIAGNOSIS — Z79899 Other long term (current) drug therapy: Secondary | ICD-10-CM | POA: Diagnosis not present

## 2022-05-12 DIAGNOSIS — E669 Obesity, unspecified: Secondary | ICD-10-CM

## 2022-05-12 LAB — HM MAMMOGRAPHY

## 2022-05-12 NOTE — Progress Notes (Signed)
Office: [phone]  /  Fax: [phone]  WEIGHT SUMMARY AND BIOMETRICS  Weight Lost Since Last Visit: 0lb  Weight Gained Since Last Visit: 0lb   Vitals Temp: 98.1 F (36.7 C) BP: 127/84 Pulse Rate: 61 SpO2: 99 %   Anthropometric Measurements Height: 5\' 2"  (1.575 m) Weight: 180 lb (81.6 kg) BMI (Calculated): 32.91 Weight at Last Visit: 180lb Weight Lost Since Last Visit: 0lb Weight Gained Since Last Visit: 0lb Starting Weight: 205lb Total Weight Loss (lbs): 25 lb (11.3 kg)   Body Composition  Body Fat %: 33.1 % Fat Mass (lbs): 59.8 lbs Muscle Mass (lbs): 114.8 lbs Total Body Water (lbs): 73.8 lbs Visceral Fat Rating : 8   Other Clinical Data Fasting: No Today's Visit #: 24 Starting Date: 03/26/21     HPI  Chief Complaint: OBESITY  Amber Stephens is here to discuss her progress with her obesity treatment plan. She is on the the Category 2 Plan and states she is following her eating plan approximately 40 % of the time. She states she is exercising 20-30 minutes 1 days per week.   Interval History:  Since last office visit she has maintained her weight.  She is not tracking her calories, etc.  She celebrated Easter since her last visit. She has had a big dead line at work and has been under more stress  Tends to snack more due to stress.  Her stress at work should decrease now that her dead line has passed. Struggles with carbs and sugar intake/cravings.  Her son's birthday is this weekend. She is drinking water daily.  Her goal weight is 165 lbs.   Pharmacotherapy for weight loss: She is not currently taking medications  for medical weight loss.    Previous pharmacotherapy for medical weight loss:  None   Bariatric surgery:  Patient never had bariatric surgery     PHYSICAL EXAM:  Blood pressure 127/84, pulse 61, temperature 98.1 F (36.7 C), height 5\' 2"  (1.575 m), weight 180 lb (81.6 kg), last menstrual period 06/04/2017, SpO2 99 %. Body mass index is  32.92 kg/m.  General: She is overweight, cooperative, alert, well developed, and in no acute distress. PSYCH: Has normal mood, affect and thought process.   Extremities: No edema.  Neurologic: No gross sensory or motor deficits. No tremors or fasciculations noted.    DIAGNOSTIC DATA REVIEWED:  BMET    Component Value Date/Time   NA 139 03/03/2022 1003   K 4.9 03/03/2022 1003   CL 102 03/03/2022 1003   CO2 24 03/03/2022 1003   GLUCOSE 89 03/03/2022 1003   GLUCOSE 89 05/28/2020 0831   BUN 10 03/03/2022 1003   CREATININE 1.07 (H) 03/03/2022 1003   CALCIUM 9.5 03/03/2022 1003   GFRNONAA >60 06/07/2017 0915   GFRAA >60 06/07/2017 0915   Lab Results  Component Value Date   HGBA1C 5.6 03/03/2022   HGBA1C 5.7 (H) 03/26/2021   Lab Results  Component Value Date   INSULIN 8.4 03/03/2022   INSULIN 19.1 03/26/2021   Lab Results  Component Value Date   TSH 0.913 03/26/2021   CBC    Component Value Date/Time   WBC 8.4 03/03/2022 1003   WBC 5.1 04/03/2019 1005   RBC 4.68 03/03/2022 1003   RBC 4.36 04/03/2019 1005   HGB 13.6 03/03/2022 1003   HCT 41.1 03/03/2022 1003   PLT 429 03/03/2022 1003   MCV 88 03/03/2022 1003   MCH 29.1 03/03/2022 1003   MCH 28.8 06/17/2017 0443  MCHC 33.1 03/03/2022 1003   MCHC 33.6 04/03/2019 1005   RDW 12.6 03/03/2022 1003   Iron Studies    Component Value Date/Time   IRON 106 10/30/2012 1137   IRONPCTSAT 28.6 10/30/2012 1137   Lipid Panel     Component Value Date/Time   CHOL 164 03/03/2022 1003   TRIG 53 03/03/2022 1003   HDL 47 03/03/2022 1003   CHOLHDL 3 05/28/2020 0831   VLDL 11.4 05/28/2020 0831   LDLCALC 106 (H) 03/03/2022 1003   Hepatic Function Panel     Component Value Date/Time   PROT 7.1 03/03/2022 1003   ALBUMIN 4.5 03/03/2022 1003   AST 16 03/03/2022 1003   ALT 21 03/03/2022 1003   ALKPHOS 59 03/03/2022 1003   BILITOT 0.3 03/03/2022 1003   BILIDIR 0.1 04/03/2019 1005      Component Value Date/Time   TSH  0.913 03/26/2021 0829   Nutritional Lab Results  Component Value Date   VD25OH 83.1 03/03/2022   VD25OH 72.4 08/05/2021   VD25OH 25.8 (L) 03/26/2021     ASSESSMENT AND PLAN  TREATMENT PLAN FOR OBESITY:  Recommended Dietary Goals  Amber Stephens is currently in the action stage of change. As such, her goal is to continue weight management plan. She has agreed to the Category 2 Plan. Plans to follow LCD until next visit.  Will drink a protein shake for BF & lunch, 2 protein snacks daily and dinner protein + veggies.    Behavioral Intervention  We discussed the following Behavioral Modification Strategies today: increasing lean protein intake, increasing vegetables, avoiding skipping meals, increasing water intake, and work on meal planning and preparation.  Additional resources provided today: NA  Recommended Physical Activity Goals  Amber Stephens has been advised to work up to 150 minutes of moderate intensity aerobic activity a week and strengthening exercises 2-3 times per week for cardiovascular health, weight loss maintenance and preservation of muscle mass.   She has agreed to Continue current level of physical activity    ASSOCIATED CONDITIONS ADDRESSED TODAY  Action/Plan  Medication management -     Comprehensive metabolic panel  Elevated creatine kinase -     Comprehensive metabolic panel  Generalized obesity -     TSH  BMI 32.0-32.9,adult -     TSH    Labs obtained today.    Return in about 3 weeks (around 06/02/2022).Marland Kitchen She was informed of the importance of frequent follow up visits to maximize her success with intensive lifestyle modifications for her multiple health conditions.   ATTESTASTION STATEMENTS:  Reviewed by clinician on day of visit: allergies, medications, problem list, medical history, surgical history, family history, social history, and previous encounter notes.   Time spent on visit including pre-visit chart review and post-visit care and charting  was 30 minutes.    Theodis Sato. Shubh Chiara FNP-C

## 2022-05-13 LAB — COMPREHENSIVE METABOLIC PANEL
ALT: 20 IU/L (ref 0–32)
AST: 16 IU/L (ref 0–40)
Albumin/Globulin Ratio: 1.5 (ref 1.2–2.2)
Albumin: 4.2 g/dL (ref 3.9–4.9)
Alkaline Phosphatase: 56 IU/L (ref 44–121)
BUN/Creatinine Ratio: 15 (ref 9–23)
BUN: 15 mg/dL (ref 6–24)
Bilirubin Total: 0.3 mg/dL (ref 0.0–1.2)
CO2: 26 mmol/L (ref 20–29)
Calcium: 9.7 mg/dL (ref 8.7–10.2)
Chloride: 104 mmol/L (ref 96–106)
Creatinine, Ser: 1.02 mg/dL — ABNORMAL HIGH (ref 0.57–1.00)
Globulin, Total: 2.8 g/dL (ref 1.5–4.5)
Glucose: 87 mg/dL (ref 70–99)
Potassium: 4.3 mmol/L (ref 3.5–5.2)
Sodium: 139 mmol/L (ref 134–144)
Total Protein: 7 g/dL (ref 6.0–8.5)
eGFR: 68 mL/min/{1.73_m2} (ref >59)

## 2022-05-13 LAB — TSH: TSH: 0.701 u[IU]/mL (ref 0.450–4.500)

## 2022-05-19 ENCOUNTER — Other Ambulatory Visit: Payer: Self-pay | Admitting: Family

## 2022-06-02 ENCOUNTER — Ambulatory Visit: Payer: 59 | Admitting: Nurse Practitioner

## 2022-06-02 ENCOUNTER — Encounter: Payer: Self-pay | Admitting: Nurse Practitioner

## 2022-06-02 VITALS — BP 117/78 | HR 64 | Temp 98.1°F | Ht 62.0 in | Wt 177.0 lb

## 2022-06-02 DIAGNOSIS — Z6832 Body mass index (BMI) 32.0-32.9, adult: Secondary | ICD-10-CM | POA: Diagnosis not present

## 2022-06-02 DIAGNOSIS — R748 Abnormal levels of other serum enzymes: Secondary | ICD-10-CM | POA: Diagnosis not present

## 2022-06-02 DIAGNOSIS — E669 Obesity, unspecified: Secondary | ICD-10-CM | POA: Diagnosis not present

## 2022-06-02 NOTE — Progress Notes (Signed)
Office: [phone]  /  Fax: [phone]  WEIGHT SUMMARY AND BIOMETRICS  Weight Lost Since Last Visit: 3lb  No data recorded  Vitals Temp: 98.1 F (36.7 C) BP: 117/78 Pulse Rate: 64 SpO2: 100 %   Anthropometric Measurements Height: 5\' 2"  (1.575 m) Weight: 177 lb (80.3 kg) BMI (Calculated): 32.37 Weight at Last Visit: 180lb Weight Lost Since Last Visit: 3lb Starting Weight: 205lb Total Weight Loss (lbs): 28 lb (12.7 kg)   Body Composition  Body Fat %: 32.2 % Fat Mass (lbs): 57.2 lbs Muscle Mass (lbs): 114.6 lbs Total Body Water (lbs): 74.6 lbs Visceral Fat Rating : 7   Other Clinical Data Fasting: No Labs: No Today's Visit #: 25 Starting Date: 03/26/21     HPI  Chief Complaint: OBESITY  Amber Stephens is here to discuss her progress with her obesity treatment plan. She is on the the Category 2 Plan and states she is following her eating plan approximately 30 % of the time. She states she is exercising 30 minutes 3 days per week.   Interval History:  Since last office visit she has 3 pounds since her last visit.  She is substituting a protein shake for breakfast and lunch, snacks: cheese stick, fruit, yogurt and dinner:  protein and 2 vegetables.  She is struggling with hunger and cravings since following LSD.  She hasn't been able to exercise as she was due to hunger and following the LSD plan.  She is going to [employer].      PHYSICAL EXAM:  Blood pressure 117/78, pulse 64, temperature 98.1 F (36.7 C), height 5\' 2"  (1.575 m), weight 177 lb (80.3 kg), last menstrual period 06/04/2017, SpO2 100 %. Body mass index is 32.37 kg/m.  General: She is overweight, cooperative, alert, well developed, and in no acute distress. PSYCH: Has normal mood, affect and thought process.   Extremities: No edema.  Neurologic: No gross sensory or motor deficits. No tremors or fasciculations noted.    DIAGNOSTIC DATA REVIEWED:  BMET    Component Value Date/Time   NA  139 05/12/2022 1138   K 4.3 05/12/2022 1138   CL 104 05/12/2022 1138   CO2 26 05/12/2022 1138   GLUCOSE 87 05/12/2022 1138   GLUCOSE 89 05/28/2020 0831   BUN 15 05/12/2022 1138   CREATININE 1.02 (H) 05/12/2022 1138   CALCIUM 9.7 05/12/2022 1138   GFRNONAA >60 06/07/2017 0915   GFRAA >60 06/07/2017 0915   Lab Results  Component Value Date   HGBA1C 5.6 03/03/2022   HGBA1C 5.7 (H) 03/26/2021   Lab Results  Component Value Date   INSULIN 8.4 03/03/2022   INSULIN 19.1 03/26/2021   Lab Results  Component Value Date   TSH 0.701 05/12/2022   CBC    Component Value Date/Time   WBC 8.4 03/03/2022 1003   WBC 5.1 04/03/2019 1005   RBC 4.68 03/03/2022 1003   RBC 4.36 04/03/2019 1005   HGB 13.6 03/03/2022 1003   HCT 41.1 03/03/2022 1003   PLT 429 03/03/2022 1003   MCV 88 03/03/2022 1003   MCH 29.1 03/03/2022 1003   MCH 28.8 06/17/2017 0443   MCHC 33.1 03/03/2022 1003   MCHC 33.6 04/03/2019 1005   RDW 12.6 03/03/2022 1003   Iron Studies    Component Value Date/Time   IRON 106 10/30/2012 1137   IRONPCTSAT 28.6 10/30/2012 1137   Lipid Panel     Component Value Date/Time   CHOL 164 03/03/2022 1003   TRIG 53 03/03/2022  1003   HDL 47 03/03/2022 1003   CHOLHDL 3 05/28/2020 0831   VLDL 11.4 05/28/2020 0831   LDLCALC 106 (H) 03/03/2022 1003   Hepatic Function Panel     Component Value Date/Time   PROT 7.0 05/12/2022 1138   ALBUMIN 4.2 05/12/2022 1138   AST 16 05/12/2022 1138   ALT 20 05/12/2022 1138   ALKPHOS 56 05/12/2022 1138   BILITOT 0.3 05/12/2022 1138   BILIDIR 0.1 04/03/2019 1005      Component Value Date/Time   TSH 0.701 05/12/2022 1138   Nutritional Lab Results  Component Value Date   VD25OH 83.1 03/03/2022   VD25OH 72.4 08/05/2021   VD25OH 25.8 (L) 03/26/2021     ASSESSMENT AND PLAN  TREATMENT PLAN FOR OBESITY:  Recommended Dietary Goals  Amber Stephens is currently in the action stage of change. As such, her goal is to continue weight management  plan. She has agreed to the Category 2 Plan.  Behavioral Intervention  We discussed the following Behavioral Modification Strategies today: increasing lean protein intake, decreasing simple carbohydrates , increasing vegetables, increasing lower glycemic fruits, increasing fiber rich foods, increasing water intake, continue to practice mindfulness when eating, and planning for success.  Additional resources provided today: NA  Recommended Physical Activity Goals  Amber Stephens has been advised to work up to 150 minutes of moderate intensity aerobic activity a week and strengthening exercises 2-3 times per week for cardiovascular health, weight loss maintenance and preservation of muscle mass.   She has agreed to Continue current level of physical activity     ASSOCIATED CONDITIONS ADDRESSED TODAY  Action/Plan  Elevated creatine kinase Labs reviewed in chart with patient.  Improved. Will continue to monitor.    Generalized obesity  BMI 32.0-32.9,adult       Labs reviewed in chart with patient form 05/12/22  No follow-ups on file.Marland Kitchen She was informed of the importance of frequent follow up visits to maximize her success with intensive lifestyle modifications for her multiple health conditions.   ATTESTASTION STATEMENTS:  Reviewed by clinician on day of visit: allergies, medications, problem list, medical history, surgical history, family history, social history, and previous encounter notes.   Time spent on visit including pre-visit chart review and post-visit care and charting was 30 minutes.    Theodis Sato. Amber Beaubien FNP-C

## 2022-06-16 ENCOUNTER — Telehealth: Payer: Self-pay | Admitting: Family

## 2022-06-16 ENCOUNTER — Encounter: Payer: Self-pay | Admitting: Family

## 2022-06-16 ENCOUNTER — Ambulatory Visit (INDEPENDENT_AMBULATORY_CARE_PROVIDER_SITE_OTHER): Payer: 59 | Admitting: Family

## 2022-06-16 VITALS — BP 125/81 | HR 75 | Temp 98.1°F | Resp 16 | Wt 185.0 lb

## 2022-06-16 DIAGNOSIS — Z0000 Encounter for general adult medical examination without abnormal findings: Secondary | ICD-10-CM | POA: Diagnosis not present

## 2022-06-16 DIAGNOSIS — I10 Essential (primary) hypertension: Secondary | ICD-10-CM

## 2022-06-16 DIAGNOSIS — E669 Obesity, unspecified: Secondary | ICD-10-CM | POA: Diagnosis not present

## 2022-06-16 NOTE — Telephone Encounter (Signed)
Please call [facility] to request a copy of pap.  

## 2022-06-16 NOTE — Progress Notes (Signed)
Subjective:     Patient ID: Amber Stephens, female    DOB: 1976/01/10, 47 y.o.   MRN: [id number]  Chief Complaint  Patient presents with   Annual Exam    HPI Patient is in today for cpx.    Patient presents today for complete physical.  Immunizations:   Diet: reports 28 pounds weight loss at [facility]  Exercise: walks 4-5 times a week, for 40 minutes Colonoscopy: 2022 Pap Smear: hysterectomy Mammogram: with GYN [facility]: up to date Dental: up to date    Health Maintenance Due  Topic Date Due   COVID-19 Vaccine (5 - 2023-24 season) 02/09/2022    Past Medical History:  Diagnosis Date   Allergic rhinitis    Anemia    borderline, prior to hysterectomy   Asthma    Fatigue    History of pneumonia age 27 and agin   my mom said at age 75 and again at age 12    Hypertension 05/28/2020   Multiple food allergies    Shortness of breath    Shortness of breath on exertion     Past Surgical History:  Procedure Laterality Date   ABDOMINAL HYSTERECTOMY N/A 06/16/2017   Procedure: HYSTERECTOMY ABDOMINAL TOTAL;  Surgeon: Freddy Finner, MD;  Location: [facility];  Service: Gynecology;  Laterality: N/A;   BILATERAL SALPINGECTOMY Bilateral 06/16/2017   Procedure: BILATERAL SALPINGECTOMY;  Surgeon: Freddy Finner, MD;  Location: [facility];  Service: Gynecology;  Laterality: Bilateral;   BTL  11/2008   Dr Rana Snare; bilateral tubal ligation    CESAREAN SECTION     x4 , with each child , has 4 children    DILATION AND CURETTAGE OF UTERUS  07/02/2012   MYOMECTOMY  2001   Dr Varney Baas at [facility]     Family History  Problem Relation Age of Onset   Alzheimer's disease Mother        died at 42 (diagnosed at 30)   Stroke Mother    Hypertension Father    Stroke Maternal Grandmother    Diabetes Maternal Grandmother    Diabetes Maternal Grandfather        (had bilateral amputation and blindness)   Colon cancer Maternal  Grandfather 79   AAA (abdominal aortic aneurysm) Maternal Grandfather    Stomach cancer Neg Hx    Esophageal cancer Neg Hx     Social History   Socioeconomic History   Marital status: Married    Spouse name: marcus   Number of children: 4   Years of education: Not on file   Highest education level: Not on file  Occupational History   Occupation: Doctor, general practice: [employer]  Tobacco Use   Smoking status: Never   Smokeless tobacco: Never  Vaping Use   Vaping Use: Never used  Substance and Sexual Activity   Alcohol use: Never   Drug use: No   Sexual activity: Yes    Partners: Male  Other Topics Concern   Not on file  Social History Narrative   4 kids (G4P3)      2002- son, [name].   2003- [name]   2005-[name]   2010- [name]       [employer] faculty member (healthcare administration)         Also teaches Medical reception at [employer]      Enjoys reading, shopping, cooking      Married 21 years  Social Determinants of Health   Financial Resource Strain: Not on file  Food Insecurity: Not on file  Transportation Needs: Not on file  Physical Activity: Not on file  Stress: Not on file  Social Connections: Not on file  Intimate Partner Violence: Not on file    Outpatient Medications Prior to Visit  Medication Sig Dispense Refill   albuterol (PROAIR HFA) 108 (90 Base) MCG/ACT inhaler USE 1-2 PUFFS BY MOUTH EVERY 4 HOURS AS NEEDED wheezing and shortness of breath 3 each 0   amLODipine (NORVASC) 5 MG tablet TAKE 1 TABLET BY MOUTH EVERY DAY 90 tablet 1   buPROPion (WELLBUTRIN SR) 200 MG 12 hr tablet TAKE 1 TABLET BY MOUTH TWICE A DAY 180 tablet 0   estradiol (VIVELLE-DOT) 0.1 MG/24HR patch Place 1 patch onto the skin 2 (two) times a week.     fluticasone (FLONASE) 50 MCG/ACT nasal spray Place into both nostrils daily.     montelukast (SINGULAIR) 10 MG tablet TAKE 1 TABLET BY MOUTH EVERYDAY AT BEDTIME 90 tablet 1   No  facility-administered medications prior to visit.    Allergies  Allergen Reactions   Grass Pollen(K-O-R-T-Swt Vern) Shortness Of Breath   Phenol Shortness Of Breath   Other     Almonds    Review of Systems  Constitutional:  Positive for weight loss.  HENT:  Negative for congestion and hearing loss.   Eyes:  Negative for blurred vision.  Respiratory:  Negative for cough and shortness of breath.   Cardiovascular:  Negative for chest pain.  Gastrointestinal:  Negative for constipation and diarrhea.  Genitourinary:  Negative for dysuria and frequency.  Musculoskeletal:  Negative for joint pain and myalgias.  Skin:  Negative for rash.  Neurological:  Negative for headaches.  Psychiatric/Behavioral:         Denies depression/anxiety       Objective:    Physical Exam  BP 125/81 (BP Location: Left Arm, Patient Position: Sitting, Cuff Size: Large)   Pulse 75   Temp 98.1 F (36.7 C) (Oral)   Resp 16   Wt 185 lb (83.9 kg)   LMP 06/04/2017 Comment: "saw some spotting "   SpO2 100%   BMI 33.84 kg/m  Wt Readings from Last 3 Encounters:  06/16/22 185 lb (83.9 kg)  06/02/22 177 lb (80.3 kg)  05/12/22 180 lb (81.6 kg)  Physical Exam  Constitutional: She is oriented to person, place, and time. She appears well-developed and well-nourished. No distress.  HENT:  Head: Normocephalic and atraumatic.  Right Ear: Tympanic membrane and ear canal normal.  Left Ear: Tympanic membrane and ear canal normal.  Mouth/Throat: Oropharynx is clear and moist.  Eyes: Pupils are equal, round, and reactive to light. No scleral icterus.  Neck: Normal range of motion. No thyromegaly present.  Cardiovascular: Normal rate and regular rhythm.   No murmur heard. Pulmonary/Chest: Effort normal and breath sounds normal. No respiratory distress. He has no wheezes. She has no rales. She exhibits no tenderness.  Abdominal: Soft. Bowel sounds are normal. She exhibits no distension and no mass. There is no  tenderness. There is no rebound and no guarding.  Musculoskeletal: She exhibits no edema.  Lymphadenopathy:    She has no cervical adenopathy.  Neurological: She is alert and oriented to person, place, and time. She has normal patellar reflexes. She exhibits normal muscle tone. Coordination normal.  Skin: Skin is warm and dry.  Psychiatric: She has a normal mood and affect. Her behavior is normal.  Judgment and thought content normal.  Breast/pelvic: deferred to GYN        Assessment & Plan:        Assessment & Plan:   Problem List Items Addressed This Visit       Unprioritized   Physical exam, annual    Continue healthy diet/exercise/weight loss.  Will request copy of mammogram from GYN.  Recommended covid/flu boosters this fall. Colo up to date, s/p hysterectomy.       Obesity (BMI 30.0-34.9) - Primary    She has lost 28 pounds working with [facility]. I commended her on this. She is taking wellbutrin for appetite suppression.       Hypertension    BP Readings from Last 3 Encounters:  06/16/22 125/81  06/02/22 117/78  05/12/22 127/84  BP at goal on amlodipine 5mg .  Continue same.        I have discontinued Gwenith M. Centrella's fluticasone and montelukast. I am also having her maintain her estradiol, albuterol, buPROPion, and amLODipine.  No orders of the defined types were placed in this encounter.

## 2022-06-16 NOTE — Telephone Encounter (Signed)
Electronic request sent 

## 2022-06-16 NOTE — Assessment & Plan Note (Signed)
Continue healthy diet/exercise/weight loss.  Will request copy of mammogram from GYN.  Recommended covid/flu boosters this fall. Colo up to date, s/p hysterectomy.

## 2022-06-16 NOTE — Assessment & Plan Note (Signed)
BP Readings from Last 3 Encounters:  06/16/22 125/81  06/02/22 117/78  05/12/22 127/84   BP at goal on amlodipine 5mg .  Continue same.

## 2022-06-16 NOTE — Assessment & Plan Note (Addendum)
She has lost 28 pounds working with [facility]. I commended her on this. She is taking wellbutrin for appetite suppression.

## 2022-06-23 ENCOUNTER — Encounter: Payer: Self-pay | Admitting: Nurse Practitioner

## 2022-06-23 ENCOUNTER — Ambulatory Visit: Payer: 59 | Admitting: Nurse Practitioner

## 2022-06-23 VITALS — BP 130/86 | HR 57 | Temp 98.0°F | Ht 62.0 in | Wt 179.0 lb

## 2022-06-23 DIAGNOSIS — R638 Other symptoms and signs concerning food and fluid intake: Secondary | ICD-10-CM | POA: Diagnosis not present

## 2022-06-23 DIAGNOSIS — Z6832 Body mass index (BMI) 32.0-32.9, adult: Secondary | ICD-10-CM | POA: Diagnosis not present

## 2022-06-23 DIAGNOSIS — E669 Obesity, unspecified: Secondary | ICD-10-CM | POA: Diagnosis not present

## 2022-06-23 DIAGNOSIS — R748 Abnormal levels of other serum enzymes: Secondary | ICD-10-CM

## 2022-06-23 MED ORDER — LOMAIRA 8 MG PO TABS: 1.0000 | ORAL_TABLET | Freq: Every day | ORAL | 0 refills | Status: DC

## 2022-06-23 NOTE — Progress Notes (Signed)
Office: [phone]  /  Fax: [phone]  WEIGHT SUMMARY AND BIOMETRICS  No data recorded Weight Gained Since Last Visit: 2lb   Vitals Temp: 98 F (36.7 C) BP: 130/86 Pulse Rate: (!) 57 SpO2: 100 %   Anthropometric Measurements Height: 5\' 2"  (1.575 m) Weight: 179 lb (81.2 kg) BMI (Calculated): 32.73 Weight at Last Visit: 177lb Weight Gained Since Last Visit: 2lb Starting Weight: 205lb Total Weight Loss (lbs): 26 lb (11.8 kg)   Body Composition  Body Fat %: 32.4 % Fat Mass (lbs): 58 lbs Muscle Mass (lbs): 114.8 lbs Total Body Water (lbs): 74 lbs Visceral Fat Rating : 8   Other Clinical Data Fasting: No Labs: No Today's Visit #: 26 Starting Date: 03/26/21     HPI  Chief Complaint: OBESITY  Amber Stephens is here to discuss her progress with her obesity treatment plan. She is on the the Category 2 Plan and states she is following her eating plan approximately 60 % of the time. She states she is exercising 30 minutes 4 days per week.   Interval History:  Since last office visit she has gained 2 pounds.  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  She went to [place] this past week.  She struggles with increased hunger especially when she exercises.   BF:  protein shake Snack:  cheese and apple Lunch:  salad with chicken and sometimes a fruit or yasso bar Snack:  sometimes a fruit Dinner:  seems to get off track Eats out:  3 times per week.     Pharmacotherapy for weight loss: She is not currently taking medications  for medical weight loss.   Previous pharmacotherapy for medical weight loss:  None  Bariatric surgery:  Patient has not had bariatric surgery.   Abnormal cravings Taking Wellbutrin SR 200mg  around 7am but notes her hunger and cravings starts around 4pm.  She finds that most of the time she forgets the 2nd dose.  Denies side effects.    PHYSICAL EXAM:  Blood pressure 130/86, pulse (!) 57,  temperature 98 F (36.7 C), height 5\' 2"  (1.575 m), weight 179 lb (81.2 kg), last menstrual period 06/04/2017, SpO2 100 %. Body mass index is 32.74 kg/m.  General: She is overweight, cooperative, alert, well developed, and in no acute distress. PSYCH: Has normal mood, affect and thought process.   Extremities: No edema.  Neurologic: No gross sensory or motor deficits. No tremors or fasciculations noted.    DIAGNOSTIC DATA REVIEWED:  BMET    Component Value Date/Time   NA 139 05/12/2022 1138   K 4.3 05/12/2022 1138   CL 104 05/12/2022 1138   CO2 26 05/12/2022 1138   GLUCOSE 87 05/12/2022 1138   GLUCOSE 89 05/28/2020 0831   BUN 15 05/12/2022 1138   CREATININE 1.02 (H) 05/12/2022 1138   CALCIUM 9.7 05/12/2022 1138   GFRNONAA >60 06/07/2017 0915   GFRAA >60 06/07/2017 0915   Lab Results  Component Value Date   HGBA1C 5.6 03/03/2022   HGBA1C 5.7 (H) 03/26/2021   Lab Results  Component Value Date   INSULIN 8.4 03/03/2022   INSULIN 19.1 03/26/2021   Lab Results  Component Value Date   TSH 0.701 05/12/2022   CBC    Component Value Date/Time   WBC 8.4 03/03/2022 1003   WBC 5.1 04/03/2019 1005   RBC 4.68 03/03/2022 1003   RBC 4.36 04/03/2019 1005   HGB 13.6 03/03/2022 1003  HCT 41.1 03/03/2022 1003   PLT 429 03/03/2022 1003   MCV 88 03/03/2022 1003   MCH 29.1 03/03/2022 1003   MCH 28.8 06/17/2017 0443   MCHC 33.1 03/03/2022 1003   MCHC 33.6 04/03/2019 1005   RDW 12.6 03/03/2022 1003   Iron Studies    Component Value Date/Time   IRON 106 10/30/2012 1137   IRONPCTSAT 28.6 10/30/2012 1137   Lipid Panel     Component Value Date/Time   CHOL 164 03/03/2022 1003   TRIG 53 03/03/2022 1003   HDL 47 03/03/2022 1003   CHOLHDL 3 05/28/2020 0831   VLDL 11.4 05/28/2020 0831   LDLCALC 106 (H) 03/03/2022 1003   Hepatic Function Panel     Component Value Date/Time   PROT 7.0 05/12/2022 1138   ALBUMIN 4.2 05/12/2022 1138   AST 16 05/12/2022 1138   ALT 20  05/12/2022 1138   ALKPHOS 56 05/12/2022 1138   BILITOT 0.3 05/12/2022 1138   BILIDIR 0.1 04/03/2019 1005      Component Value Date/Time   TSH 0.701 05/12/2022 1138   Nutritional Lab Results  Component Value Date   VD25OH 83.1 03/03/2022   VD25OH 72.4 08/05/2021   VD25OH 25.8 (L) 03/26/2021     ASSESSMENT AND PLAN  TREATMENT PLAN FOR OBESITY:  Recommended Dietary Goals  Amber Stephens is currently in the action stage of change. As such, her goal is to continue weight management plan. She has agreed to the Category 2 Plan.  Behavioral Intervention  We discussed the following Behavioral Modification Strategies today: increasing lean protein intake, decreasing simple carbohydrates , increasing vegetables, increasing lower glycemic fruits, increasing fiber rich foods, avoiding skipping meals, increasing water intake, continue to practice mindfulness when eating, and planning for success.  Additional resources provided today: NA  Recommended Physical Activity Goals  Amber Stephens has been advised to work up to 150 minutes of moderate intensity aerobic activity a week and strengthening exercises 2-3 times per week for cardiovascular health, weight loss maintenance and preservation of muscle mass.   She has agreed to Continue current level of physical activity    Pharmacotherapy We discussed various medication options to help Amber Stephens with her weight loss efforts and we both agreed to start Lomaira 8mg  for short term.  Will monitor BP at home and will check creat at next visit. If BP increase while taking, will stop taking Lomaira. Discussed with Dr. Manson Passey.   ASSOCIATED CONDITIONS ADDRESSED TODAY  Action/Plan  Abnormal craving Will continue Wellbutrin once daily,  side effects discussed  Elevated creatine kinase Will recheck labs at next visit.    Generalized obesity -     Lomaira; Take 1 tablet (8 mg total) by mouth daily.  Dispense: 30 tablet; Refill: 0  BMI 32.0-32.9,adult -      Lomaira; Take 1 tablet (8 mg total) by mouth daily.  Dispense: 30 tablet; Refill: 0      Possible Options discussed today:  Lomaira 8mg  once daily  Increase Wellbutrin 200mg  BID Qsymia-information given.  Would monitor creat closely and BP if she decides to take Qsymia    Return in about 4 weeks (around 07/21/2022).Marland Kitchen She was informed of the importance of frequent follow up visits to maximize her success with intensive lifestyle modifications for her multiple health conditions.   ATTESTASTION STATEMENTS:  Reviewed by clinician on day of visit: allergies, medications, problem list, medical history, surgical history, family history, social history, and previous encounter notes.     Theodis Sato. Dantre Yearwood FNP-C

## 2022-07-01 ENCOUNTER — Other Ambulatory Visit: Payer: Self-pay | Admitting: Nurse Practitioner

## 2022-07-01 DIAGNOSIS — R638 Other symptoms and signs concerning food and fluid intake: Secondary | ICD-10-CM

## 2022-07-01 NOTE — Progress Notes (Signed)
No communication

## 2022-07-15 ENCOUNTER — Ambulatory Visit: Payer: 59 | Admitting: Bariatrics

## 2022-07-21 ENCOUNTER — Ambulatory Visit: Payer: 59 | Admitting: Bariatrics

## 2022-07-21 ENCOUNTER — Encounter: Payer: Self-pay | Admitting: Bariatrics

## 2022-07-21 VITALS — BP 121/81 | HR 72 | Temp 98.1°F | Ht 62.0 in | Wt 184.0 lb

## 2022-07-21 DIAGNOSIS — R632 Polyphagia: Secondary | ICD-10-CM | POA: Diagnosis not present

## 2022-07-21 DIAGNOSIS — R7303 Prediabetes: Secondary | ICD-10-CM

## 2022-07-21 DIAGNOSIS — E669 Obesity, unspecified: Secondary | ICD-10-CM | POA: Diagnosis not present

## 2022-07-21 DIAGNOSIS — Z6832 Body mass index (BMI) 32.0-32.9, adult: Secondary | ICD-10-CM

## 2022-07-21 MED ORDER — LOMAIRA 8 MG PO TABS: 1.0000 | ORAL_TABLET | Freq: Every day | ORAL | 0 refills | Status: DC

## 2022-07-21 NOTE — Progress Notes (Signed)
WEIGHT SUMMARY AND BIOMETRICS  Weight Gained Since Last Visit: 5lb   Vitals Temp: 98.1 F (36.7 C) BP: 121/81 Pulse Rate: 72 SpO2: 99 %   Anthropometric Measurements Height: 5\' 2"  (1.575 m) Weight: 184 lb (83.5 kg) BMI (Calculated): 33.65 Weight at Last Visit: 179lb Weight Gained Since Last Visit: 5lb Starting Weight: 205lb Total Weight Loss (lbs): 21 lb (9.526 kg)   Body Composition  Body Fat %: 34.1 % Fat Mass (lbs): 62.8 lbs Muscle Mass (lbs): 115 lbs Total Body Water (lbs): 76 lbs Visceral Fat Rating : 8   Other Clinical Data Fasting: no Labs: no Today's Visit #: 19 Starting Date: 03/26/22    OBESITY Amber Stephens is here to discuss her progress with her obesity treatment plan along with follow-up of her obesity related diagnoses.     Nutrition Plan: the Category 2 plan - 50% adherence.  Current exercise: walking  Interim History:  She has been traveling and is up 5 lbs since her last visit.  Protein intake is as prescribed and Water intake is inadequate.  Pharmacotherapy: Amber Stephens is on Lomaira 8 mg daily  Adverse side effects: None Hunger is moderately controlled.  Cravings are moderately controlled.  Assessment/Plan:   Polyphagia Amber Stephens endorses excessive hunger at times.  Medication(s): Lomaira Effects of medication:  moderately controlled. Cravings are moderately controlled.   Plan: Medication(s): {dwwpharmacotherapy:29109} Will increase water, protein and fiber to help assuage hunger.  Will minimize foods that have a high glucose index/load to minimize reactive hypoglycemia.     Prediabetes Last A1c was ***  Medication(s): *** {dwwglp:29109} Lab Results  Component Value Date   HGBA1C 5.6 03/03/2022   HGBA1C 5.5 08/05/2021   HGBA1C 5.7 (H) 03/26/2021   Lab Results  Component Value Date   INSULIN 8.4 03/03/2022   INSULIN 11.3 08/05/2021   INSULIN 19.1 03/26/2021    Plan: Will minimize all refined carbohydrates both  sweets and starches.  Will work on the plan and exercise.  Consider both aerobic and resistance training.  Will keep protein, water, and fiber intake high.  Increase Polyunsaturated and Monounsaturated fats to increase satiety and encourage weight loss.  Aim for 7 to 9 hours of sleep nightly.  Will continue medications.  *** {dwwmed:29123} {dwwglp:29109}   BMI 32.0-32.9,adult ***  Generalized obesity ***     Generalized Obesity: Current BMI BMI (Calculated): 33.65   Pharmacotherapy Plan {dwwmed:29123}  {dwwpharmacotherapy:29109}  Amber Stephens {CHL AMB IS/IS NOT:210130109} currently in the action stage of change. As such, her goal is to {MWMwtloss#1:210800005}.  She has agreed to {dwwsldiets:29085}.  Exercise goals: For substantial health benefits, adults should do at least 150 minutes (2 hours and 30 minutes) a week of moderate-intensity, or 75 minutes (1 hour and 15 minutes) a week of vigorous-intensity aerobic physical activity, or an equivalent combination of moderate- and vigorous-intensity aerobic activity. Aerobic activity should be performed in episodes of at least 10 minutes, and preferably, it should be spread throughout the week.  Behavioral modification strategies: increasing lean protein intake, no meal skipping, meal planning , increase water intake, better snacking choices, planning for success, and increasing vegetables.  Amber Stephens has agreed to follow-up with our clinic in 4 weeks.       Objective:   VITALS: Per patient if applicable, see vitals. GENERAL: Alert and in no acute distress. CARDIOPULMONARY: No increased WOB. Speaking in clear sentences.  PSYCH: Pleasant and cooperative. Speech normal rate and rhythm. Affect is appropriate. Insight and judgement are appropriate. Attention is focused, linear, and  appropriate.  NEURO: Oriented as arrived to appointment on time with no prompting.   Attestation Statements:   This was prepared with the assistance of Licensed conveyancer.  Occasional wrong-word or sound-a-like substitutions may have occurred due to the inherent limitations of voice recognition software.   Corinna Capra, DO

## 2022-08-04 ENCOUNTER — Encounter: Payer: Self-pay | Admitting: Nurse Practitioner

## 2022-08-04 ENCOUNTER — Ambulatory Visit: Payer: 59 | Admitting: Nurse Practitioner

## 2022-08-04 VITALS — BP 134/84 | HR 70 | Temp 98.2°F | Ht 62.0 in | Wt 185.0 lb

## 2022-08-04 DIAGNOSIS — Z6833 Body mass index (BMI) 33.0-33.9, adult: Secondary | ICD-10-CM

## 2022-08-04 DIAGNOSIS — R632 Polyphagia: Secondary | ICD-10-CM

## 2022-08-04 DIAGNOSIS — E669 Obesity, unspecified: Secondary | ICD-10-CM

## 2022-08-04 NOTE — Progress Notes (Signed)
Office: [phone]  /  Fax: [phone]  WEIGHT SUMMARY AND BIOMETRICS  No data recorded Weight Gained Since Last Visit: 1lb   Vitals Temp: 98.2 F (36.8 C) BP: 134/84 Pulse Rate: 70 SpO2: 100 %   Anthropometric Measurements Height: 5\' 2"  (1.575 m) Weight: 185 lb (83.9 kg) BMI (Calculated): 33.83 Weight at Last Visit: 184lb Weight Gained Since Last Visit: 1lb Starting Weight: 205lb Total Weight Loss (lbs): 20 lb (9.072 kg)   Body Composition  Body Fat %: 35.2 % Fat Mass (lbs): 65.2 lbs Muscle Mass (lbs): 114.2 lbs Total Body Water (lbs): 77.4 lbs Visceral Fat Rating : 9   Other Clinical Data Fasting: No Labs: No Today's Visit #: 28 Starting Date: 03/26/22     HPI  Chief Complaint: OBESITY  Amber Stephens is here to discuss her progress with her obesity treatment plan. She is on the the Category 2 Plan and states she is following her eating plan approximately 5 % of the time. She states she is exercising 30-45 minutes 4 days per week.   Interval History:  Since last office visit she has gained 1 pound.  She went to [place] since her last visit.  No upcoming celebrations.  She is struggling with cravings especially ice cream and hunger.       Pharmacotherapy for weight loss: She is currently taking Lomaira 8mg  afternoon (started on 07/22/22) for medical weight loss.  Denies side effects.  She is also taking Wellbutrin SR 200mg  in the am for cravings.  Denies side effects.    PHYSICAL EXAM:  Blood pressure 134/84, pulse 70, temperature 98.2 F (36.8 C), height 5\' 2"  (1.575 m), weight 185 lb (83.9 kg), last menstrual period 06/04/2017, SpO2 100 %. Body mass index is 33.84 kg/m.  General: She is overweight, cooperative, alert, well developed, and in no acute distress. PSYCH: Has normal mood, affect and thought process.   Extremities: No edema.  Neurologic: No gross sensory or motor deficits. No tremors or fasciculations noted.    DIAGNOSTIC DATA  REVIEWED:  BMET    Component Value Date/Time   NA 139 05/12/2022 1138   K 4.3 05/12/2022 1138   CL 104 05/12/2022 1138   CO2 26 05/12/2022 1138   GLUCOSE 87 05/12/2022 1138   GLUCOSE 89 05/28/2020 0831   BUN 15 05/12/2022 1138   CREATININE 1.02 (H) 05/12/2022 1138   CALCIUM 9.7 05/12/2022 1138   GFRNONAA >60 06/07/2017 0915   GFRAA >60 06/07/2017 0915   Lab Results  Component Value Date   HGBA1C 5.6 03/03/2022   HGBA1C 5.7 (H) 03/26/2021   Lab Results  Component Value Date   INSULIN 8.4 03/03/2022   INSULIN 19.1 03/26/2021   Lab Results  Component Value Date   TSH 0.701 05/12/2022   CBC    Component Value Date/Time   WBC 8.4 03/03/2022 1003   WBC 5.1 04/03/2019 1005   RBC 4.68 03/03/2022 1003   RBC 4.36 04/03/2019 1005   HGB 13.6 03/03/2022 1003   HCT 41.1 03/03/2022 1003   PLT 429 03/03/2022 1003   MCV 88 03/03/2022 1003   MCH 29.1 03/03/2022 1003   MCH 28.8 06/17/2017 0443   MCHC 33.1 03/03/2022 1003   MCHC 33.6 04/03/2019 1005   RDW 12.6 03/03/2022 1003   Iron Studies    Component Value Date/Time   IRON 106 10/30/2012 1137   IRONPCTSAT 28.6 10/30/2012 1137   Lipid Panel     Component Value Date/Time   CHOL 164 03/03/2022 1003  TRIG 53 03/03/2022 1003   HDL 47 03/03/2022 1003   CHOLHDL 3 05/28/2020 0831   VLDL 11.4 05/28/2020 0831   LDLCALC 106 (H) 03/03/2022 1003   Hepatic Function Panel     Component Value Date/Time   PROT 7.0 05/12/2022 1138   ALBUMIN 4.2 05/12/2022 1138   AST 16 05/12/2022 1138   ALT 20 05/12/2022 1138   ALKPHOS 56 05/12/2022 1138   BILITOT 0.3 05/12/2022 1138   BILIDIR 0.1 04/03/2019 1005      Component Value Date/Time   TSH 0.701 05/12/2022 1138   Nutritional Lab Results  Component Value Date   VD25OH 83.1 03/03/2022   VD25OH 72.4 08/05/2021   VD25OH 25.8 (L) 03/26/2021     ASSESSMENT AND PLAN  TREATMENT PLAN FOR OBESITY:  Recommended Dietary Goals  Amber Stephens is currently in the action stage of  change. As such, her goal is to continue weight management plan. She has agreed to keeping a food journal and adhering to recommended goals of 1200 calories and 75+ protein.  Behavioral Intervention  We discussed the following Behavioral Modification Strategies today: increasing lean protein intake, decreasing simple carbohydrates , increasing vegetables, increasing lower glycemic fruits, increasing fiber rich foods, avoiding skipping meals, increasing water intake, continue to practice mindfulness when eating, and planning for success.  Additional resources provided today: NA  Recommended Physical Activity Goals  Amber Stephens has been advised to work up to 150 minutes of moderate intensity aerobic activity a week and strengthening exercises 2-3 times per week for cardiovascular health, weight loss maintenance and preservation of muscle mass.   She has agreed to Continue current level of physical activity  and Think about ways to increase daily physical activity and overcoming barriers to exercise   Pharmacotherapy We discussed various medication options to help Amber Stephens with her weight loss efforts and we both agreed to continue Lomaira 8mg . Patient would like to continue one pill daily for the next week while tracking and see how she does.  If she is not losing weight and continues to struggle with cravings/polyphagia she will take BID for several days.  Will monitor BP at home and send me via mychart if she decides to increase dose to BID.  If her BP elevated on 1 po BID she will decrease back to one daily.  Side effects discussed.    ASSOCIATED CONDITIONS ADDRESSED TODAY  Action/Plan  Polyphagia See above note.  Intensive lifestyle modifications are the first line treatment for this issue. We discussed several lifestyle modifications today and she will continue to work on diet, exercise and weight loss efforts. Orders and follow up as documented in patient record.  Counseling Polyphagia is  excessive hunger. Causes can include: low blood sugars, hypERthyroidism, PMS, lack of sleep, stress, insulin resistance, diabetes, certain medications, and diets that are deficient in protein and fiber.    Generalized obesity  BMI 33.0-33.9,adult      Will check labs at next visit.  Will continue to monitor creat and BP closely.     Return in about 2 weeks (around 08/18/2022).Marland Kitchen She was informed of the importance of frequent follow up visits to maximize her success with intensive lifestyle modifications for her multiple health conditions.   ATTESTASTION STATEMENTS:  Reviewed by clinician on day of visit: allergies, medications, problem list, medical history, surgical history, family history, social history, and previous encounter notes.   Time spent on visit including pre-visit chart review and post-visit care and charting was 30 minutes.    Judeth Cornfield  Harmon Pier FNP-C

## 2022-08-17 ENCOUNTER — Ambulatory Visit: Payer: 59 | Admitting: Nurse Practitioner

## 2022-08-17 ENCOUNTER — Encounter: Payer: Self-pay | Admitting: Nurse Practitioner

## 2022-08-17 VITALS — BP 129/84 | HR 71 | Temp 98.2°F | Ht 62.0 in | Wt 182.0 lb

## 2022-08-17 DIAGNOSIS — Z6833 Body mass index (BMI) 33.0-33.9, adult: Secondary | ICD-10-CM | POA: Diagnosis not present

## 2022-08-17 DIAGNOSIS — E669 Obesity, unspecified: Secondary | ICD-10-CM | POA: Diagnosis not present

## 2022-08-17 DIAGNOSIS — R748 Abnormal levels of other serum enzymes: Secondary | ICD-10-CM | POA: Diagnosis not present

## 2022-08-17 MED ORDER — LOMAIRA 8 MG PO TABS
1.0000 | ORAL_TABLET | Freq: Every day | ORAL | 0 refills | Status: DC
Start: 2022-08-17 — End: 2022-09-27

## 2022-08-17 NOTE — Progress Notes (Unsigned)
Office: [phone]  /  Fax: [phone]  WEIGHT SUMMARY AND BIOMETRICS  Weight Lost Since Last Visit: 3lb  Weight Gained Since Last Visit: 0lb   Vitals Temp: 98.2 F (36.8 C) BP: 129/84 Pulse Rate: 71 SpO2: 99 %   Anthropometric Measurements Height: 5\' 2"  (1.575 m) Weight: 182 lb (82.6 kg) BMI (Calculated): 33.28 Weight at Last Visit: 185lb Weight Lost Since Last Visit: 3lb Weight Gained Since Last Visit: 0lb Starting Weight: 205lb Total Weight Loss (lbs): 23 lb (10.4 kg)   Body Composition  Body Fat %: 32.7 % Fat Mass (lbs): 59.6 lbs Muscle Mass (lbs): 116.6 lbs Total Body Water (lbs): 75.6 lbs Visceral Fat Rating : 8   Other Clinical Data Fasting: No Labs: No Today's Visit #: 29 Starting Date: 03/26/22     HPI  Chief Complaint: OBESITY  Amber Stephens is here to discuss her progress with her obesity treatment plan. She is on the the Category 2 Plan and states she is following her eating plan approximately 50 % of the time. She states she is exercising 30-45 minutes 3 days per week.   Interval History:  Since last office visit she has lost 3 pounds. She is sometimes substituting a protein shake for breakfast, lunch: salad with protein and dinner:  protein and vegetable. She has been walking 3 days per week.  Goal weight 165 lbs.   She is going to [place] next week   Pharmacotherapy for weight loss: She is currently taking Lomaira 8mg  for medical weight loss.  Denies side effects.  Started taking around 4pm-6pm due to hunger/cravings in the evenings.  Since moving to the afternoon, her cravings have gotten better.  She is also taking Wellbutrin SR 200mg  in the am.  Denies side effects.    Previous pharmacotherapy for medical weight loss:  {srtpreviousweightlossmeds (Optional):29124}  She stopped *** due to side effects of ***.  Bariatric surgery:  Patient is status post {srtweightlosssurgery:29125} by Dr.  Marland Kitchen in ***.  Her highest weight prior to surgery  was *** and her nadir weight after surgery was ***.  She is taking {srtvitamins (Optional):29126}.  She reports {srtrestriction (Optional):29127} restriction.     PHYSICAL EXAM:  Blood pressure 129/84, pulse 71, temperature 98.2 F (36.8 C), height 5\' 2"  (1.575 m), weight 182 lb (82.6 kg), last menstrual period 06/04/2017, SpO2 99%. Body mass index is 33.29 kg/m.  General: She is overweight, cooperative, alert, well developed, and in no acute distress. PSYCH: Has normal mood, affect and thought process.   Extremities: No edema.  Neurologic: No gross sensory or motor deficits. No tremors or fasciculations noted.    DIAGNOSTIC DATA REVIEWED:  BMET    Component Value Date/Time   NA 139 05/12/2022 1138   K 4.3 05/12/2022 1138   CL 104 05/12/2022 1138   CO2 26 05/12/2022 1138   GLUCOSE 87 05/12/2022 1138   GLUCOSE 89 05/28/2020 0831   BUN 15 05/12/2022 1138   CREATININE 1.02 (H) 05/12/2022 1138   CALCIUM 9.7 05/12/2022 1138   GFRNONAA >60 06/07/2017 0915   GFRAA >60 06/07/2017 0915   Lab Results  Component Value Date   HGBA1C 5.6 03/03/2022   HGBA1C 5.7 (H) 03/26/2021   Lab Results  Component Value Date   INSULIN 8.4 03/03/2022   INSULIN 19.1 03/26/2021   Lab Results  Component Value Date   TSH 0.701 05/12/2022   CBC    Component Value Date/Time   WBC 8.4 03/03/2022 1003   WBC 5.1 04/03/2019 1005  RBC 4.68 03/03/2022 1003   RBC 4.36 04/03/2019 1005   HGB 13.6 03/03/2022 1003   HCT 41.1 03/03/2022 1003   PLT 429 03/03/2022 1003   MCV 88 03/03/2022 1003   MCH 29.1 03/03/2022 1003   MCH 28.8 06/17/2017 0443   MCHC 33.1 03/03/2022 1003   MCHC 33.6 04/03/2019 1005   RDW 12.6 03/03/2022 1003   Iron Studies    Component Value Date/Time   IRON 106 10/30/2012 1137   IRONPCTSAT 28.6 10/30/2012 1137   Lipid Panel     Component Value Date/Time   CHOL 164 03/03/2022 1003   TRIG 53 03/03/2022 1003   HDL 47 03/03/2022 1003   CHOLHDL 3 05/28/2020 0831   VLDL  11.4 05/28/2020 0831   LDLCALC 106 (H) 03/03/2022 1003   Hepatic Function Panel     Component Value Date/Time   PROT 7.0 05/12/2022 1138   ALBUMIN 4.2 05/12/2022 1138   AST 16 05/12/2022 1138   ALT 20 05/12/2022 1138   ALKPHOS 56 05/12/2022 1138   BILITOT 0.3 05/12/2022 1138   BILIDIR 0.1 04/03/2019 1005      Component Value Date/Time   TSH 0.701 05/12/2022 1138   Nutritional Lab Results  Component Value Date   VD25OH 83.1 03/03/2022   VD25OH 72.4 08/05/2021   VD25OH 25.8 (L) 03/26/2021     ASSESSMENT AND PLAN  TREATMENT PLAN FOR OBESITY:  Recommended Dietary Goals  Amber Stephens is currently in the action stage of change. As such, her goal is to continue weight management plan. She has agreed to {MWMwtlossportion/plan2:23431}.  Behavioral Intervention  We discussed the following Behavioral Modification Strategies today: {EMWMwtlossstrategies:28914::"increasing lean protein intake","decreasing simple carbohydrates ","increasing vegetables","increasing lower glycemic fruits","increasing water intake","continue to practice mindfulness when eating","planning for success"}.  Additional resources provided today: NA  Recommended Physical Activity Goals  Amber Stephens has been advised to work up to 150 minutes of moderate intensity aerobic activity a week and strengthening exercises 2-3 times per week for cardiovascular health, weight loss maintenance and preservation of muscle mass.   She has agreed to {EMEXERCISE:28847::"Think about ways to increase daily physical activity and overcoming barriers to exercise"}   Pharmacotherapy We discussed various medication options to help Amber Stephens with her weight loss efforts and we both agreed to ***.  ASSOCIATED CONDITIONS ADDRESSED TODAY  Action/Plan  Elevated creatine kinase -     Basic metabolic panel  Generalized obesity -     Lomaira; Take 1 tablet (8 mg total) by mouth daily.  Dispense: 30 tablet; Refill: 0  BMI  33.0-33.9,adult         No follow-ups on file.Marland Kitchen She was informed of the importance of frequent follow up visits to maximize her success with intensive lifestyle modifications for her multiple health conditions.   ATTESTASTION STATEMENTS:  Reviewed by clinician on day of visit: allergies, medications, problem list, medical history, surgical history, family history, social history, and previous encounter notes.   Time spent on visit including pre-visit chart review and post-visit care and charting was *** minutes.    Theodis Sato. Alexandros Ewan FNP-C

## 2022-08-18 ENCOUNTER — Encounter: Payer: Self-pay | Admitting: Nurse Practitioner

## 2022-08-18 LAB — BASIC METABOLIC PANEL
BUN/Creatinine Ratio: 16 (ref 9–23)
BUN: 15 mg/dL (ref 6–24)
CO2: 24 mmol/L (ref 20–29)
Calcium: 9.9 mg/dL (ref 8.7–10.2)
Chloride: 100 mmol/L (ref 96–106)
Creatinine, Ser: 0.92 mg/dL (ref 0.57–1.00)
Glucose: 76 mg/dL (ref 70–99)
Potassium: 4.2 mmol/L (ref 3.5–5.2)
Sodium: 137 mmol/L (ref 134–144)
eGFR: 77 mL/min/{1.73_m2} (ref >59)

## 2022-09-15 ENCOUNTER — Ambulatory Visit: Payer: 59 | Admitting: Nurse Practitioner

## 2022-09-27 ENCOUNTER — Encounter: Payer: Self-pay | Admitting: Nurse Practitioner

## 2022-09-27 ENCOUNTER — Ambulatory Visit: Payer: 59 | Admitting: Nurse Practitioner

## 2022-09-27 VITALS — BP 118/82 | HR 86 | Temp 98.4°F | Ht 62.0 in | Wt 182.0 lb

## 2022-09-27 DIAGNOSIS — E669 Obesity, unspecified: Secondary | ICD-10-CM

## 2022-09-27 DIAGNOSIS — R638 Other symptoms and signs concerning food and fluid intake: Secondary | ICD-10-CM | POA: Diagnosis not present

## 2022-09-27 DIAGNOSIS — R748 Abnormal levels of other serum enzymes: Secondary | ICD-10-CM | POA: Diagnosis not present

## 2022-09-27 DIAGNOSIS — Z6833 Body mass index (BMI) 33.0-33.9, adult: Secondary | ICD-10-CM | POA: Diagnosis not present

## 2022-09-27 MED ORDER — LOMAIRA 8 MG PO TABS
1.0000 | ORAL_TABLET | Freq: Two times a day (BID) | ORAL | 0 refills | Status: DC
Start: 2022-09-27 — End: 2022-10-20

## 2022-09-27 NOTE — Progress Notes (Signed)
Office: [phone]  /  Fax: [phone]  WEIGHT SUMMARY AND BIOMETRICS  Weight Lost Since Last Visit: 0lb  Weight Gained Since Last Visit: 0lb   Vitals Temp: 98.4 F (36.9 C) BP: 118/82 Pulse Rate: 86 SpO2: 100 %   Anthropometric Measurements Height: 5\' 2"  (1.575 m) Weight: 182 lb (82.6 kg) BMI (Calculated): 33.28 Weight at Last Visit: 182lb Weight Lost Since Last Visit: 0lb Weight Gained Since Last Visit: 0lb Starting Weight: 205lb Total Weight Loss (lbs): 23 lb (10.4 kg)   Body Composition  Body Fat %: 34.4 % Fat Mass (lbs): 62.6 lbs Muscle Mass (lbs): 119.4 lbs Total Body Water (lbs): 74.6 lbs Visceral Fat Rating : 8   Other Clinical Data Fasting: No Labs: No Today's Visit #: 30 Starting Date: 03/26/22     HPI  Chief Complaint: OBESITY  Amber Stephens is here to discuss her progress with her obesity treatment plan. She is on the the Category 2 Plan and states she is following her eating plan approximately 30-40 % of the time. She states she is exercising 30-40 minutes 3-4 days per week.   Interval History:  Since last office visit she has maintained her weight.  She is substituting a protein shake for breakfast.  She has been walking on her treadmill more.  Notes increased hunger and cravings since increasing activity.  Trying not to snack due to hunger.  She is drinking water daily.    BF:  Protein shake  Snack:  apple Lunch:  salad + protein Snack:  sometimes fruit salad Dinner:  protein + vegetable + sometimes carb     Pharmacotherapy for weight loss: She is currently taking Lomaira 8mg  daily around 3-4pmg for medical weight loss.  If she takes later, notes some insomnia.  She is also taking Wellbutrin SR 200mg  in the am. Denies side effects.   Bariatric surgery:  Patient has not had bariatric surgery.    Elevated creatine kinase  Labs on 08/17/22 looked better.  PHYSICAL EXAM:  Blood pressure 118/82, pulse 86, temperature 98.4 F (36.9 C),  height 5\' 2"  (1.575 m), weight 182 lb (82.6 kg), last menstrual period 06/04/2017, SpO2 100%. Body mass index is 33.29 kg/m.  General: She is overweight, cooperative, alert, well developed, and in no acute distress. PSYCH: Has normal mood, affect and thought process.   Extremities: No edema.  Neurologic: No gross sensory or motor deficits. No tremors or fasciculations noted.    DIAGNOSTIC DATA REVIEWED:  BMET    Component Value Date/Time   NA 137 08/17/2022 1626   K 4.2 08/17/2022 1626   CL 100 08/17/2022 1626   CO2 24 08/17/2022 1626   GLUCOSE 76 08/17/2022 1626   GLUCOSE 89 05/28/2020 0831   BUN 15 08/17/2022 1626   CREATININE 0.92 08/17/2022 1626   CALCIUM 9.9 08/17/2022 1626   GFRNONAA >60 06/07/2017 0915   GFRAA >60 06/07/2017 0915   Lab Results  Component Value Date   HGBA1C 5.6 03/03/2022   HGBA1C 5.7 (H) 03/26/2021   Lab Results  Component Value Date   INSULIN 8.4 03/03/2022   INSULIN 19.1 03/26/2021   Lab Results  Component Value Date   TSH 0.701 05/12/2022   CBC    Component Value Date/Time   WBC 8.4 03/03/2022 1003   WBC 5.1 04/03/2019 1005   RBC 4.68 03/03/2022 1003   RBC 4.36 04/03/2019 1005   HGB 13.6 03/03/2022 1003   HCT 41.1 03/03/2022 1003   PLT 429 03/03/2022 1003  MCV 88 03/03/2022 1003   MCH 29.1 03/03/2022 1003   MCH 28.8 06/17/2017 0443   MCHC 33.1 03/03/2022 1003   MCHC 33.6 04/03/2019 1005   RDW 12.6 03/03/2022 1003   Iron Studies    Component Value Date/Time   IRON 106 10/30/2012 1137   IRONPCTSAT 28.6 10/30/2012 1137   Lipid Panel     Component Value Date/Time   CHOL 164 03/03/2022 1003   TRIG 53 03/03/2022 1003   HDL 47 03/03/2022 1003   CHOLHDL 3 05/28/2020 0831   VLDL 11.4 05/28/2020 0831   LDLCALC 106 (H) 03/03/2022 1003   Hepatic Function Panel     Component Value Date/Time   PROT 7.0 05/12/2022 1138   ALBUMIN 4.2 05/12/2022 1138   AST 16 05/12/2022 1138   ALT 20 05/12/2022 1138   ALKPHOS 56 05/12/2022  1138   BILITOT 0.3 05/12/2022 1138   BILIDIR 0.1 04/03/2019 1005      Component Value Date/Time   TSH 0.701 05/12/2022 1138   Nutritional Lab Results  Component Value Date   VD25OH 83.1 03/03/2022   VD25OH 72.4 08/05/2021   VD25OH 25.8 (L) 03/26/2021     ASSESSMENT AND PLAN  TREATMENT PLAN FOR OBESITY:  Recommended Dietary Goals  Amber Stephens is currently in the action stage of change. As such, her goal is to continue weight management plan. She has agreed to the Category 2 Plan.  Behavioral Intervention  We discussed the following Behavioral Modification Strategies today: increasing lean protein intake, decreasing simple carbohydrates , increasing vegetables, increasing lower glycemic fruits, increasing water intake, work on meal planning and preparation, reading food labels , keeping healthy foods at home, continue to practice mindfulness when eating, and planning for success.  Additional resources provided today: NA  Recommended Physical Activity Goals  Amber Stephens has been advised to work up to 150 minutes of moderate intensity aerobic activity a week and strengthening exercises 2-3 times per week for cardiovascular health, weight loss maintenance and preservation of muscle mass.   She has agreed to Continue current level of physical activity    Pharmacotherapy We discussed various medication options to help Amber Stephens with her weight loss efforts and we both agreed to increase Lomaira 8mg  BID.  Side effects discussed.  Will continue to monitor BP.    ASSOCIATED CONDITIONS ADDRESSED TODAY  Action/Plan  Elevated creatine kinase Looks better.  Will continue to monitor.    Abnormal craving Continue Lomaira 8mg -increase to BID.    Generalized obesity -     Lomaira; Take 1 tablet (8 mg total) by mouth 2 (two) times daily.  Dispense: 60 tablet; Refill: 0         Return in about 3 weeks (around 10/18/2022).Marland Kitchen She was informed of the importance of frequent follow up visits  to maximize her success with intensive lifestyle modifications for her multiple health conditions.   ATTESTASTION STATEMENTS:  Reviewed by clinician on day of visit: allergies, medications, problem list, medical history, surgical history, family history, social history, and previous encounter notes.    Theodis Sato. Calirose Mccance FNP-C

## 2022-10-20 ENCOUNTER — Ambulatory Visit: Payer: 59 | Admitting: Nurse Practitioner

## 2022-10-20 ENCOUNTER — Encounter: Payer: Self-pay | Admitting: Nurse Practitioner

## 2022-10-20 VITALS — BP 118/82 | HR 64 | Temp 98.2°F | Ht 62.0 in | Wt 181.0 lb

## 2022-10-20 DIAGNOSIS — E669 Obesity, unspecified: Secondary | ICD-10-CM

## 2022-10-20 DIAGNOSIS — R632 Polyphagia: Secondary | ICD-10-CM | POA: Diagnosis not present

## 2022-10-20 DIAGNOSIS — Z6833 Body mass index (BMI) 33.0-33.9, adult: Secondary | ICD-10-CM

## 2022-10-20 MED ORDER — LOMAIRA 8 MG PO TABS
1.0000 | ORAL_TABLET | Freq: Two times a day (BID) | ORAL | 0 refills | Status: DC
Start: 2022-10-20 — End: 2022-12-15

## 2022-10-20 NOTE — Progress Notes (Signed)
Office: [phone]  /  Fax: [phone]  WEIGHT SUMMARY AND BIOMETRICS  Weight Lost Since Last Visit: 1lb  Weight Gained Since Last Visit: 0lb   Vitals Temp: 98.2 F (36.8 C) BP: 118/82 Pulse Rate: 64 SpO2: 99 %   Anthropometric Measurements Height: 5\' 2"  (1.575 m) Weight: 181 lb (82.1 kg) BMI (Calculated): 33.1 Weight at Last Visit: 182lb Weight Lost Since Last Visit: 1lb Weight Gained Since Last Visit: 0lb Starting Weight: 205lb Total Weight Loss (lbs): 24 lb (10.9 kg)   Body Composition  Body Fat %: 33 % Fat Mass (lbs): 59.8 lbs Muscle Mass (lbs): 115.4 lbs Total Body Water (lbs): 74.2 lbs Visceral Fat Rating : 8   Other Clinical Data Fasting: No Labs: No Today's Visit #: 31 Starting Date: 03/26/22     HPI  Chief Complaint: OBESITY  Amber Stephens is here to discuss her progress with her obesity treatment plan. She is on the the Category 2 Plan and states she is following her eating plan approximately 50 % of the time. She states she is exercising 30-35 minutes 4 days per week-cardio.   Interval History:  Since last office visit she has lost 1 pound.  Over the past 5 days she started replacing a protein shake for 2 meals due to plateauing.  She is tracking using the loseitapp and has been averaging around 1200 calories, 95-128 grams carbs and 44-80 grams of protein. She is meal prepping on days she goes to work.  She is drinking daily.  Denies sugary drinks      Pharmacotherapy for weight loss: She is currently taking  Lomaira 8mg  BID and medications  for medical weight loss and Wellbutrin SR 200mg  for cravings.  Denies side effects.  Has helped with polyphagia and cravings.    Bariatric surgery:  Patient has not had bariatric surgery    PHYSICAL EXAM:  Blood pressure 118/82, pulse 64, temperature 98.2 F (36.8 C), height 5\' 2"  (1.575 m), weight 181 lb (82.1 kg), last menstrual period 06/04/2017, SpO2 99%. Body mass index is 33.11 kg/m.  General:  She is overweight, cooperative, alert, well developed, and in no acute distress. PSYCH: Has normal mood, affect and thought process.   Extremities: No edema.  Neurologic: No gross sensory or motor deficits. No tremors or fasciculations noted.    DIAGNOSTIC DATA REVIEWED:  BMET    Component Value Date/Time   NA 137 08/17/2022 1626   K 4.2 08/17/2022 1626   CL 100 08/17/2022 1626   CO2 24 08/17/2022 1626   GLUCOSE 76 08/17/2022 1626   GLUCOSE 89 05/28/2020 0831   BUN 15 08/17/2022 1626   CREATININE 0.92 08/17/2022 1626   CALCIUM 9.9 08/17/2022 1626   GFRNONAA >60 06/07/2017 0915   GFRAA >60 06/07/2017 0915   Lab Results  Component Value Date   HGBA1C 5.6 03/03/2022   HGBA1C 5.7 (H) 03/26/2021   Lab Results  Component Value Date   INSULIN 8.4 03/03/2022   INSULIN 19.1 03/26/2021   Lab Results  Component Value Date   TSH 0.701 05/12/2022   CBC    Component Value Date/Time   WBC 8.4 03/03/2022 1003   WBC 5.1 04/03/2019 1005   RBC 4.68 03/03/2022 1003   RBC 4.36 04/03/2019 1005   HGB 13.6 03/03/2022 1003   HCT 41.1 03/03/2022 1003   PLT 429 03/03/2022 1003   MCV 88 03/03/2022 1003   MCH 29.1 03/03/2022 1003   MCH 28.8 06/17/2017 0443   MCHC 33.1 03/03/2022 1003  MCHC 33.6 04/03/2019 1005   RDW 12.6 03/03/2022 1003   Iron Studies    Component Value Date/Time   IRON 106 10/30/2012 1137   IRONPCTSAT 28.6 10/30/2012 1137   Lipid Panel     Component Value Date/Time   CHOL 164 03/03/2022 1003   TRIG 53 03/03/2022 1003   HDL 47 03/03/2022 1003   CHOLHDL 3 05/28/2020 0831   VLDL 11.4 05/28/2020 0831   LDLCALC 106 (H) 03/03/2022 1003   Hepatic Function Panel     Component Value Date/Time   PROT 7.0 05/12/2022 1138   ALBUMIN 4.2 05/12/2022 1138   AST 16 05/12/2022 1138   ALT 20 05/12/2022 1138   ALKPHOS 56 05/12/2022 1138   BILITOT 0.3 05/12/2022 1138   BILIDIR 0.1 04/03/2019 1005      Component Value Date/Time   TSH 0.701 05/12/2022 1138    Nutritional Lab Results  Component Value Date   VD25OH 83.1 03/03/2022   VD25OH 72.4 08/05/2021   VD25OH 25.8 (L) 03/26/2021     ASSESSMENT AND PLAN  TREATMENT PLAN FOR OBESITY:  Recommended Dietary Goals  Amber Stephens is currently in the action stage of change. As such, her goal is to continue weight management plan. She has agreed to the Category 2 Plan.  On work out day:  1400 calories and 75+ grams of protein:  On non work out days:  1100 calories and 75+ grams of protein.   Behavioral Intervention  We discussed the following Behavioral Modification Strategies today: increasing lean protein intake, decreasing simple carbohydrates , increasing vegetables, increasing lower glycemic fruits, increasing water intake, work on meal planning and preparation, work on tracking and journaling calories using tracking application, reading food labels , keeping healthy foods at home, continue to practice mindfulness when eating, and planning for success.  Additional resources provided today: NA  Recommended Physical Activity Goals  Amber Stephens has been advised to work up to 150 minutes of moderate intensity aerobic activity a week and strengthening exercises 2-3 times per week for cardiovascular health, weight loss maintenance and preservation of muscle mass.   She has agreed to Continue current level of physical activity    Pharmacotherapy We discussed various medication options to help Amber Stephens with her weight loss efforts and we both agreed to continue Lomaira 8mg  BID and Wellbutrin SR 200mg .  Side effects discussed.   ASSOCIATED CONDITIONS ADDRESSED TODAY  Action/Plan  Polyphagia -     Lomaira; Take 1 tablet (8 mg total) by mouth 2 (two) times daily.  Dispense: 60 tablet; Refill: 0.  Side effects discussed.  Will continue to monitor BP.    Generalized obesity -     Lomaira; Take 1 tablet (8 mg total) by mouth 2 (two) times daily.  Dispense: 60 tablet; Refill: 0  BMI  33.0-33.9,adult     IC and BMP at next visit    Return in about 4 weeks (around 11/17/2022).Marland Kitchen She was informed of the importance of frequent follow up visits to maximize her success with intensive lifestyle modifications for her multiple health conditions.   ATTESTASTION STATEMENTS:  Reviewed by clinician on day of visit: allergies, medications, problem list, medical history, surgical history, family history, social history, and previous encounter notes.     Theodis Sato. Keric Zehren FNP-C

## 2022-11-11 ENCOUNTER — Other Ambulatory Visit: Payer: Self-pay | Admitting: Family

## 2022-11-17 ENCOUNTER — Encounter: Payer: Self-pay | Admitting: Nurse Practitioner

## 2022-11-17 ENCOUNTER — Ambulatory Visit: Payer: 59 | Admitting: Nurse Practitioner

## 2022-11-17 VITALS — BP 123/83 | HR 70 | Temp 98.5°F | Ht 62.0 in | Wt 179.0 lb

## 2022-11-17 DIAGNOSIS — E669 Obesity, unspecified: Secondary | ICD-10-CM

## 2022-11-17 DIAGNOSIS — R638 Other symptoms and signs concerning food and fluid intake: Secondary | ICD-10-CM | POA: Diagnosis not present

## 2022-11-17 DIAGNOSIS — Z6832 Body mass index (BMI) 32.0-32.9, adult: Secondary | ICD-10-CM

## 2022-11-17 DIAGNOSIS — R0602 Shortness of breath: Secondary | ICD-10-CM | POA: Diagnosis not present

## 2022-11-17 NOTE — Progress Notes (Signed)
Office: [phone]  /  Fax: [phone]  WEIGHT SUMMARY AND BIOMETRICS  Weight Lost Since Last Visit: 2lb  Weight Gained Since Last Visit: 0lb   Vitals Temp: 98.5 F (36.9 C) BP: 123/83 Pulse Rate: 70 SpO2: 99 %   Anthropometric Measurements Height: 5\' 2"  (1.575 m) Weight: 179 lb (81.2 kg) BMI (Calculated): 32.73 Weight at Last Visit: 181lb Weight Lost Since Last Visit: 2lb Weight Gained Since Last Visit: 0lb Starting Weight: 205lb Total Weight Loss (lbs): 26 lb (11.8 kg)   Body Composition  Body Fat %: 33.6 % Fat Mass (lbs): 60.4 lbs Muscle Mass (lbs): 113.2 lbs Total Body Water (lbs): 73.8 lbs Visceral Fat Rating : 8   Other Clinical Data RMR: [id number] Fasting: Yes Labs: No Today's Visit #: 32 Starting Date: 03/26/22     HPI  Chief Complaint: OBESITY  Eldana is here to discuss her progress with her obesity treatment plan. She is on the the Category 2 Plan and states she is following her eating plan approximately 40 % of the time. She states she is exercising 30 minutes 3-4 days per week-treadmill.   Interval History:  Since last office visit she has lost 2 pounds. She has gotten off track some due to stopping Lomaira and increased hunger/cravings since stopping Lomaira.  She started substituting a protein shake this past week for 2 meals. She plans to continue substituting a protein shake for breakfast. She is walking in the am.     Pharmacotherapy for weight loss: She is not currently taking medications  for medical weight loss. Has been off Lomaira for 3 weeks due to difficulties obtaining from the pharmacy. She is also taking Wellbutrin 200 mg SR once daily.  Struggles with remembering to take the second dose.  Notes some polyphagia and cravings since stopping Lomaira especially in the evenings.    Shortness of breath IC was 1786 on 03/26/21 IC was 1627 on 11/11/21 Today IC was 1627    PHYSICAL EXAM:  Blood pressure 123/83, pulse 70,  temperature 98.5 F (36.9 C), height 5\' 2"  (1.575 m), weight 179 lb (81.2 kg), last menstrual period 06/04/2017, SpO2 99%. Body mass index is 32.74 kg/m.  General: She is overweight, cooperative, alert, well developed, and in no acute distress. PSYCH: Has normal mood, affect and thought process.   Extremities: No edema.  Neurologic: No gross sensory or motor deficits. No tremors or fasciculations noted.    DIAGNOSTIC DATA REVIEWED:  BMET    Component Value Date/Time   NA 137 08/17/2022 1626   K 4.2 08/17/2022 1626   CL 100 08/17/2022 1626   CO2 24 08/17/2022 1626   GLUCOSE 76 08/17/2022 1626   GLUCOSE 89 05/28/2020 0831   BUN 15 08/17/2022 1626   CREATININE 0.92 08/17/2022 1626   CALCIUM 9.9 08/17/2022 1626   GFRNONAA >60 06/07/2017 0915   GFRAA >60 06/07/2017 0915   Lab Results  Component Value Date   HGBA1C 5.6 03/03/2022   HGBA1C 5.7 (H) 03/26/2021   Lab Results  Component Value Date   INSULIN 8.4 03/03/2022   INSULIN 19.1 03/26/2021   Lab Results  Component Value Date   TSH 0.701 05/12/2022   CBC    Component Value Date/Time   WBC 8.4 03/03/2022 1003   WBC 5.1 04/03/2019 1005   RBC 4.68 03/03/2022 1003   RBC 4.36 04/03/2019 1005   HGB 13.6 03/03/2022 1003   HCT 41.1 03/03/2022 1003   PLT 429 03/03/2022 1003   MCV 88  03/03/2022 1003   MCH 29.1 03/03/2022 1003   MCH 28.8 06/17/2017 0443   MCHC 33.1 03/03/2022 1003   MCHC 33.6 04/03/2019 1005   RDW 12.6 03/03/2022 1003   Iron Studies    Component Value Date/Time   IRON 106 10/30/2012 1137   IRONPCTSAT 28.6 10/30/2012 1137   Lipid Panel     Component Value Date/Time   CHOL 164 03/03/2022 1003   TRIG 53 03/03/2022 1003   HDL 47 03/03/2022 1003   CHOLHDL 3 05/28/2020 0831   VLDL 11.4 05/28/2020 0831   LDLCALC 106 (H) 03/03/2022 1003   Hepatic Function Panel     Component Value Date/Time   PROT 7.0 05/12/2022 1138   ALBUMIN 4.2 05/12/2022 1138   AST 16 05/12/2022 1138   ALT 20 05/12/2022  1138   ALKPHOS 56 05/12/2022 1138   BILITOT 0.3 05/12/2022 1138   BILIDIR 0.1 04/03/2019 1005      Component Value Date/Time   TSH 0.701 05/12/2022 1138   Nutritional Lab Results  Component Value Date   VD25OH 83.1 03/03/2022   VD25OH 72.4 08/05/2021   VD25OH 25.8 (L) 03/26/2021     ASSESSMENT AND PLAN  TREATMENT PLAN FOR OBESITY:  Recommended Dietary Goals  Greidys is currently in the action stage of change. As such, her goal is to continue weight management plan. She has agreed to the Category 2 Plan.  Behavioral Intervention  We discussed the following Behavioral Modification Strategies today: continue to work on maintaining a reduced calorie state, getting the recommended amount of protein, incorporating whole foods, making healthy choices, staying well hydrated and practicing mindfulness when eating..  Additional resources provided today: NA  Recommended Physical Activity Goals  Lucrezia has been advised to work up to 150 minutes of moderate intensity aerobic activity a week and strengthening exercises 2-3 times per week for cardiovascular health, weight loss maintenance and preservation of muscle mass.   She has agreed to Continue current level of physical activity  and Start strengthening exercises with a goal of 2-3 sessions a week    Pharmacotherapy We discussed various medication options to help Ingris with her weight loss efforts and we both agreed to not restart Lomaira and to take Wellbutrin at lunch time to see if will help with evening cravings.  ASSOCIATED CONDITIONS ADDRESSED TODAY  Action/Plan  SOB (shortness of breath) Same Continue working on increasing water intake, protein intake and exercising  Abnormal craving to take Wellbutrin at lunch time to see if will help with evening cravings.  Generalized obesity  BMI 32.0-32.9,adult     Will check BMP at next visit    Return in about 4 weeks (around 12/15/2022).Marland Kitchen She was informed of the  importance of frequent follow up visits to maximize her success with intensive lifestyle modifications for her multiple health conditions.   ATTESTASTION STATEMENTS:  Reviewed by clinician on day of visit: allergies, medications, problem list, medical history, surgical history, family history, social history, and previous encounter notes.   Time spent on visit including pre-visit chart review and post-visit care and charting was 30 minutes.    Theodis Sato. Olesya Wike FNP-C

## 2022-12-15 ENCOUNTER — Ambulatory Visit: Payer: 59 | Admitting: Nurse Practitioner

## 2022-12-15 ENCOUNTER — Encounter: Payer: Self-pay | Admitting: Nurse Practitioner

## 2022-12-15 VITALS — BP 132/84 | HR 70 | Temp 98.4°F | Ht 62.0 in | Wt 181.0 lb

## 2022-12-15 DIAGNOSIS — Z6833 Body mass index (BMI) 33.0-33.9, adult: Secondary | ICD-10-CM

## 2022-12-15 DIAGNOSIS — R748 Abnormal levels of other serum enzymes: Secondary | ICD-10-CM

## 2022-12-15 DIAGNOSIS — E669 Obesity, unspecified: Secondary | ICD-10-CM

## 2022-12-15 DIAGNOSIS — R632 Polyphagia: Secondary | ICD-10-CM

## 2022-12-15 MED ORDER — LOMAIRA 8 MG PO TABS
1.0000 | ORAL_TABLET | Freq: Two times a day (BID) | ORAL | 0 refills | Status: DC
Start: 2022-12-15 — End: 2023-02-16

## 2022-12-15 NOTE — Progress Notes (Signed)
Office: [phone]  /  Fax: [phone]  WEIGHT SUMMARY AND BIOMETRICS  Weight Lost Since Last Visit: 0lb  Weight Gained Since Last Visit: 2lb   Vitals Temp: 98.4 F (36.9 C) BP: 132/84 Pulse Rate: 70 SpO2: 100 %   Anthropometric Measurements Height: 5\' 2"  (1.575 m) Weight: 181 lb (82.1 kg) BMI (Calculated): 33.1 Weight at Last Visit: 179lb Weight Lost Since Last Visit: 0lb Weight Gained Since Last Visit: 2lb Starting Weight: 205lb Total Weight Loss (lbs): 24 lb (10.9 kg)   Body Composition  Body Fat %: 33.7 % Fat Mass (lbs): 61 lbs Muscle Mass (lbs): 114.2 lbs Total Body Water (lbs): 73.2 lbs Visceral Fat Rating : 8   Other Clinical Data Fasting: Yes Labs: No Today's Visit #: 68 Starting Date: 03/26/22     HPI  Chief Complaint: OBESITY  Amber Stephens is here to discuss her progress with her obesity treatment plan. She is on the the Category 2 Plan and states she is following her eating plan approximately 30-40 % of the time. She states she is exercising 30 minutes 2-3 days per week.   Interval History:  Since last office visit she has gained 2 pounds.  She finds she has been struggling due to recently sinusitis.     Pharmacotherapy for weight loss: She is currently taking Wellbutrin SR 200mg  at breakfast for cravings.  Can't remember to take BID or at lunchtime.  Struggling with cravings/polyphgaie in the evenings.  Denies side effects.    Previous pharmacotherapy for medical weight loss:  Lomaira  Bariatric surgery:  Patient has not had bariatric surgery.    PHYSICAL EXAM:  Blood pressure 132/84, pulse 70, temperature 98.4 F (36.9 C), height 5\' 2"  (1.575 m), weight 181 lb (82.1 kg), last menstrual period 06/04/2017, SpO2 100%. Body mass index is 33.11 kg/m.  General: She is overweight, cooperative, alert, well developed, and in no acute distress. PSYCH: Has normal mood, affect and thought process.   Extremities: No edema.  Neurologic: No  gross sensory or motor deficits. No tremors or fasciculations noted.    DIAGNOSTIC DATA REVIEWED:  BMET    Component Value Date/Time   NA 137 08/17/2022 1626   K 4.2 08/17/2022 1626   CL 100 08/17/2022 1626   CO2 24 08/17/2022 1626   GLUCOSE 76 08/17/2022 1626   GLUCOSE 89 05/28/2020 0831   BUN 15 08/17/2022 1626   CREATININE 0.92 08/17/2022 1626   CALCIUM 9.9 08/17/2022 1626   GFRNONAA >60 06/07/2017 0915   GFRAA >60 06/07/2017 0915   Lab Results  Component Value Date   HGBA1C 5.6 03/03/2022   HGBA1C 5.7 (H) 03/26/2021   Lab Results  Component Value Date   INSULIN 8.4 03/03/2022   INSULIN 19.1 03/26/2021   Lab Results  Component Value Date   TSH 0.701 05/12/2022   CBC    Component Value Date/Time   WBC 8.4 03/03/2022 1003   WBC 5.1 04/03/2019 1005   RBC 4.68 03/03/2022 1003   RBC 4.36 04/03/2019 1005   HGB 13.6 03/03/2022 1003   HCT 41.1 03/03/2022 1003   PLT 429 03/03/2022 1003   MCV 88 03/03/2022 1003   MCH 29.1 03/03/2022 1003   MCH 28.8 06/17/2017 0443   MCHC 33.1 03/03/2022 1003   MCHC 33.6 04/03/2019 1005   RDW 12.6 03/03/2022 1003   Iron Studies    Component Value Date/Time   IRON 106 10/30/2012 1137   IRONPCTSAT 28.6 10/30/2012 1137   Lipid Panel  Component Value Date/Time   CHOL 164 03/03/2022 1003   TRIG 53 03/03/2022 1003   HDL 47 03/03/2022 1003   CHOLHDL 3 05/28/2020 0831   VLDL 11.4 05/28/2020 0831   LDLCALC 106 (H) 03/03/2022 1003   Hepatic Function Panel     Component Value Date/Time   PROT 7.0 05/12/2022 1138   ALBUMIN 4.2 05/12/2022 1138   AST 16 05/12/2022 1138   ALT 20 05/12/2022 1138   ALKPHOS 56 05/12/2022 1138   BILITOT 0.3 05/12/2022 1138   BILIDIR 0.1 04/03/2019 1005      Component Value Date/Time   TSH 0.701 05/12/2022 1138   Nutritional Lab Results  Component Value Date   VD25OH 83.1 03/03/2022   VD25OH 72.4 08/05/2021   VD25OH 25.8 (L) 03/26/2021     ASSESSMENT AND PLAN  TREATMENT PLAN FOR  OBESITY:  Recommended Dietary Goals  Amber Stephens is currently in the action stage of change. As such, her goal is to continue weight management plan. She has agreed to the Category 2 Plan.  Behavioral Intervention  We discussed the following Behavioral Modification Strategies today: work on meal planning and preparation, reading food labels , keeping healthy foods at home, planning for success, better snacking choices, staying on track while traveling and vacationing, and continue to work on maintaining a reduced calorie state, getting the recommended amount of protein, incorporating whole foods, making healthy choices, staying well hydrated and practicing mindfulness when eating..  Additional resources provided today: NA  Recommended Physical Activity Goals  Amber Stephens has been advised to work up to 150 minutes of moderate intensity aerobic activity a week and strengthening exercises 2-3 times per week for cardiovascular health, weight loss maintenance and preservation of muscle mass.   She has agreed to Think about enjoyable ways to increase daily physical activity and overcoming barriers to exercise, Increase physical activity in their day and reduce sedentary time (increase NEAT)., Increase the intensity, frequency or duration of strengthening exercises , and Increase the intensity, frequency or duration of aerobic exercises     Pharmacotherapy We discussed various medication options to help Amber Stephens with her weight loss efforts and we both agreed to restart Lomaira over the holiays.  ASSOCIATED CONDITIONS ADDRESSED TODAY  Action/Plan  Elevated creatine kinase Will recheck at next visit  Polyphagia -     Lomaira; Take 1 tablet (8 mg total) by mouth 2 (two) times daily.  Dispense: 60 tablet; Refill: 0. Side effects discussed.  Will continue to monitor BP while taking Lomaira   Generalized obesity -     Lomaira; Take 1 tablet (8 mg total) by mouth 2 (two) times daily.  Dispense: 60  tablet; Refill: 0  BMI 33.0-33.9,adult      Patient is taking OTC meds for sinusitis and recently finished Amoxicillin.    Will check BMP and fasting labs at next visit.    Return in about 3 weeks (around 01/05/2023).Marland Kitchen She was informed of the importance of frequent follow up visits to maximize her success with intensive lifestyle modifications for her multiple health conditions.   ATTESTASTION STATEMENTS:  Reviewed by clinician on day of visit: allergies, medications, problem list, medical history, surgical history, family history, social history, and previous encounter notes.     Theodis Sato. Amber Hautala FNP-C

## 2022-12-22 ENCOUNTER — Ambulatory Visit: Payer: 59 | Admitting: Family

## 2023-01-05 ENCOUNTER — Encounter: Payer: Self-pay | Admitting: Nurse Practitioner

## 2023-01-05 ENCOUNTER — Ambulatory Visit: Payer: 59 | Admitting: Nurse Practitioner

## 2023-01-05 VITALS — BP 136/85 | HR 84 | Temp 98.4°F | Ht 62.0 in | Wt 181.0 lb

## 2023-01-05 DIAGNOSIS — R748 Abnormal levels of other serum enzymes: Secondary | ICD-10-CM | POA: Diagnosis not present

## 2023-01-05 DIAGNOSIS — E669 Obesity, unspecified: Secondary | ICD-10-CM

## 2023-01-05 DIAGNOSIS — Z6833 Body mass index (BMI) 33.0-33.9, adult: Secondary | ICD-10-CM | POA: Diagnosis not present

## 2023-01-05 NOTE — Progress Notes (Signed)
Office: [phone]  /  Fax: [phone]  WEIGHT SUMMARY AND BIOMETRICS  Weight Lost Since Last Visit: 0lb  Weight Gained Since Last Visit: 0lb   Vitals Temp: 98.4 F (36.9 C) BP: 136/85 Pulse Rate: 84 SpO2: 99 %   Anthropometric Measurements Height: 5\' 2"  (1.575 m) Weight: 181 lb (82.1 kg) BMI (Calculated): 33.1 Weight at Last Visit: 181lb Weight Lost Since Last Visit: 0lb Weight Gained Since Last Visit: 0lb Starting Weight: 205lb Total Weight Loss (lbs): 24 lb (10.9 kg)   Body Composition  Body Fat %: 34 % Fat Mass (lbs): 61.6 lbs Muscle Mass (lbs): 113.8 lbs Total Body Water (lbs): 73.4 lbs Visceral Fat Rating : 8   Other Clinical Data Fasting: Yes Labs: Yes Today's Visit #: 34 Starting Date: 03/26/22     HPI  Chief Complaint: OBESITY  Amber Stephens is here to discuss her progress with her obesity treatment plan. She is on the the Category 2 Plan and states she is following her eating plan approximately 80 % of the time. She states she is exercising 30 minutes 2-3 days per week.   Interval History:  Since last office visit she has maintained her weight.  She notes she has gotten off track since her last visit.  She has multiple celebrations coming up for Christmas.  Notes cravings especially for sweets.  Drinking a protein shake for breakfast.  Sometimes skips meals.    Pharmacotherapy for weight loss: She is currently taking Wellbutrin SR 200mg  at breakfast for cravings and Lomaira 8mg  in the afternoon.  Denies side effects.  Helps with polyphagia.     Previous pharmacotherapy for medical weight loss:  Lomaira   Bariatric surgery:  Patient has not had bariatric surgery.    PHYSICAL EXAM:  Blood pressure 136/85, pulse 84, temperature 98.4 F (36.9 C), height 5\' 2"  (1.575 m), weight 181 lb (82.1 kg), last menstrual period 06/04/2017, SpO2 99%. Body mass index is 33.11 kg/m.  General: She is overweight, cooperative, alert, well developed, and in  no acute distress. PSYCH: Has normal mood, affect and thought process.   Extremities: No edema.  Neurologic: No gross sensory or motor deficits. No tremors or fasciculations noted.    DIAGNOSTIC DATA REVIEWED:  BMET    Component Value Date/Time   NA 137 08/17/2022 1626   K 4.2 08/17/2022 1626   CL 100 08/17/2022 1626   CO2 24 08/17/2022 1626   GLUCOSE 76 08/17/2022 1626   GLUCOSE 89 05/28/2020 0831   BUN 15 08/17/2022 1626   CREATININE 0.92 08/17/2022 1626   CALCIUM 9.9 08/17/2022 1626   GFRNONAA >60 06/07/2017 0915   GFRAA >60 06/07/2017 0915   Lab Results  Component Value Date   HGBA1C 5.6 03/03/2022   HGBA1C 5.7 (H) 03/26/2021   Lab Results  Component Value Date   INSULIN 8.4 03/03/2022   INSULIN 19.1 03/26/2021   Lab Results  Component Value Date   TSH 0.701 05/12/2022   CBC    Component Value Date/Time   WBC 8.4 03/03/2022 1003   WBC 5.1 04/03/2019 1005   RBC 4.68 03/03/2022 1003   RBC 4.36 04/03/2019 1005   HGB 13.6 03/03/2022 1003   HCT 41.1 03/03/2022 1003   PLT 429 03/03/2022 1003   MCV 88 03/03/2022 1003   MCH 29.1 03/03/2022 1003   MCH 28.8 06/17/2017 0443   MCHC 33.1 03/03/2022 1003   MCHC 33.6 04/03/2019 1005   RDW 12.6 03/03/2022 1003   Iron Studies  Component Value Date/Time   IRON 106 10/30/2012 1137   IRONPCTSAT 28.6 10/30/2012 1137   Lipid Panel     Component Value Date/Time   CHOL 164 03/03/2022 1003   TRIG 53 03/03/2022 1003   HDL 47 03/03/2022 1003   CHOLHDL 3 05/28/2020 0831   VLDL 11.4 05/28/2020 0831   LDLCALC 106 (H) 03/03/2022 1003   Hepatic Function Panel     Component Value Date/Time   PROT 7.0 05/12/2022 1138   ALBUMIN 4.2 05/12/2022 1138   AST 16 05/12/2022 1138   ALT 20 05/12/2022 1138   ALKPHOS 56 05/12/2022 1138   BILITOT 0.3 05/12/2022 1138   BILIDIR 0.1 04/03/2019 1005      Component Value Date/Time   TSH 0.701 05/12/2022 1138   Nutritional Lab Results  Component Value Date   VD25OH 83.1  03/03/2022   VD25OH 72.4 08/05/2021   VD25OH 25.8 (L) 03/26/2021     ASSESSMENT AND PLAN  TREATMENT PLAN FOR OBESITY:  Recommended Dietary Goals  Amber Stephens is currently in the action stage of change. As such, her goal is to continue weight management plan. She has agreed to keeping a food journal and adhering to recommended goals of 1200 calories and 75+ protein.  Behavioral Intervention  We discussed the following Behavioral Modification Strategies today: work on tracking and journaling calories using tracking application, celebration eating strategies, and continue to work on maintaining a reduced calorie state, getting the recommended amount of protein, incorporating whole foods, making healthy choices, staying well hydrated and practicing mindfulness when eating..  Additional resources provided today: NA  Recommended Physical Activity Goals  Amber Stephens has been advised to work up to 150 minutes of moderate intensity aerobic activity a week and strengthening exercises 2-3 times per week for cardiovascular health, weight loss maintenance and preservation of muscle mass.   She has agreed to Continue current level of physical activity    Pharmacotherapy We discussed various medication options to help Amber Stephens with her weight loss efforts and we both agreed to continue Lomaira 8mg  daily and Wellbutrin for cravings daily.  Can increase Lomaira to BID.  Side effects discussed.    ASSOCIATED CONDITIONS ADDRESSED TODAY  Action/Plan  Elevated creatine kinase -     Basic metabolic panel  Generalized obesity  BMI 33.0-33.9,adult         Return in about 4 weeks (around 02/02/2023).Marland Kitchen She was informed of the importance of frequent follow up visits to maximize her success with intensive lifestyle modifications for her multiple health conditions.   ATTESTASTION STATEMENTS:  Reviewed by clinician on day of visit: allergies, medications, problem list, medical history, surgical history,  family history, social history, and previous encounter notes.   Time spent on visit including pre-visit chart review and post-visit care and charting was 30 minutes.    Theodis Sato. Astin Sayre FNP-C

## 2023-01-06 LAB — BASIC METABOLIC PANEL
BUN/Creatinine Ratio: 15 (ref 9–23)
BUN: 15 mg/dL (ref 6–24)
CO2: 25 mmol/L (ref 20–29)
Calcium: 9.6 mg/dL (ref 8.7–10.2)
Chloride: 104 mmol/L (ref 96–106)
Creatinine, Ser: 1.01 mg/dL — ABNORMAL HIGH (ref 0.57–1.00)
Glucose: 86 mg/dL (ref 70–99)
Potassium: 4.5 mmol/L (ref 3.5–5.2)
Sodium: 141 mmol/L (ref 134–144)
eGFR: 69 mL/min/{1.73_m2} (ref >59)

## 2023-01-19 ENCOUNTER — Ambulatory Visit: Payer: 59 | Admitting: Family

## 2023-01-24 ENCOUNTER — Ambulatory Visit: Payer: 59 | Admitting: Family

## 2023-01-24 VITALS — BP 129/80 | HR 74 | Temp 98.3°F | Resp 16 | Ht 62.0 in | Wt 188.0 lb

## 2023-01-24 DIAGNOSIS — E66811 Obesity, class 1: Secondary | ICD-10-CM

## 2023-01-24 DIAGNOSIS — I10 Essential (primary) hypertension: Secondary | ICD-10-CM

## 2023-01-24 DIAGNOSIS — Z90710 Acquired absence of both cervix and uterus: Secondary | ICD-10-CM

## 2023-01-24 DIAGNOSIS — J4520 Mild intermittent asthma, uncomplicated: Secondary | ICD-10-CM

## 2023-01-24 DIAGNOSIS — R7303 Prediabetes: Secondary | ICD-10-CM

## 2023-01-24 NOTE — Assessment & Plan Note (Signed)
  Blood pressure controlled on Amlodipine 5mg  daily. No reported side effects. -Continue Amlodipine 5mg  daily.

## 2023-01-24 NOTE — Assessment & Plan Note (Signed)
  Active participation in the healthy weight and wellness program. Occasional use of Phentermine. No recent A1c, but weight loss since last normal A1c suggests likely improvement. -Continue with healthy weight and wellness program.

## 2023-01-24 NOTE — Progress Notes (Signed)
Subjective:     Patient ID: Amber Stephens, female    DOB: 10/27/1975, 47 y.o.   MRN: [id number]  Chief Complaint  Patient presents with   Hypertension    Here for follow up    Hypertension    Discussed the use of AI scribe software for clinical note transcription with the patient, who gave verbal consent to proceed.  History of Present Illness   The patient, with a history of hypertension, asthma, and obesity, presents for a routine follow-up. She has been working closely with the healthy weight and wellness team and reports that it is going "beautiful." She occasionally takes phentermine, prescribed by the wellness team, and has lost weight since her last visit. Her blood pressure is well-controlled on amlodipine 5mg  daily, and she denies any associated ankle swelling.  Her asthma has been stable, although she has needed her albuterol inhaler a few times in the past few days due to exposure to allergens at Christmas parties. She is allergic to pets and has been in environments with cats recently.  She is also on an estradiol patch, prescribed by her gynecologist, for menopausal symptoms following a hysterectomy. This has been effective in managing her hot flashes. She also takes Wellbutrin and phentermine, which have been somewhat helpful for appetite suppression.          Health Maintenance Due  Topic Date Due   COVID-19 Vaccine (5 - 2024-25 season) 10/03/2022    Past Medical History:  Diagnosis Date   Allergic rhinitis    Anemia    borderline, prior to hysterectomy   Asthma    Fatigue    History of pneumonia age 25 and agin   my mom said at age 29 and again at age 85    Hypertension 05/28/2020   Multiple food allergies    Shortness of breath    Shortness of breath on exertion     Past Surgical History:  Procedure Laterality Date   ABDOMINAL HYSTERECTOMY N/A 06/16/2017   Procedure: HYSTERECTOMY ABDOMINAL TOTAL;  Surgeon: Freddy Finner, MD;  Location: [facility];  Service: Gynecology;  Laterality: N/A;   BILATERAL SALPINGECTOMY Bilateral 06/16/2017   Procedure: BILATERAL SALPINGECTOMY;  Surgeon: Freddy Finner, MD;  Location: [facility];  Service: Gynecology;  Laterality: Bilateral;   BTL  11/2008   Dr Rana Snare; bilateral tubal ligation    CESAREAN SECTION     x4 , with each child , has 4 children    DILATION AND CURETTAGE OF UTERUS  07/02/2012   MYOMECTOMY  2001   Dr Varney Baas at [facility]     Family History  Problem Relation Age of Onset   Alzheimer's disease Mother        died at 70 (diagnosed at 39)   Stroke Mother    Hypertension Father    Stroke Maternal Grandmother    Diabetes Maternal Grandmother    Diabetes Maternal Grandfather        (had bilateral amputation and blindness)   Colon cancer Maternal Grandfather 65   AAA (abdominal aortic aneurysm) Maternal Grandfather    Stomach cancer Neg Hx    Esophageal cancer Neg Hx     Social History   Socioeconomic History   Marital status: Married    Spouse name: marcus   Number of children: 4   Years of education: Not on file   Highest education level: Master's degree (e.g., MA, MS, MEng, MEd, MSW, MBA)  Occupational  History   Occupation: Doctor, general practice: [employer]  Tobacco Use   Smoking status: Never   Smokeless tobacco: Never  Vaping Use   Vaping status: Never Used  Substance and Sexual Activity   Alcohol use: Never   Drug use: No   Sexual activity: Yes    Partners: Male  Other Topics Concern   Not on file  Social History Narrative   4 kids (G4P3)      2002- son, [name].   2003- Madia Purse   2005-[name]   2010- [name]       [employer] faculty member (healthcare administration)         Also teaches Medical reception at [employer]      Enjoys reading, shopping, cooking      Married 21 years    Social Drivers of [employer]: Low Risk  (01/24/2023)   Overall  Financial Resource Strain (CARDIA)    Difficulty of Paying Living Expenses: Not hard at all  Food Insecurity: No Food Insecurity (01/24/2023)   Hunger Vital Sign    Worried About Running Out of Food in the Last Year: Never true    Ran Out of Food in the Last Year: Never true  Transportation Needs: No Transportation Needs (01/24/2023)   PRAPARE - Administrator, Civil Service (Medical): No    Lack of Transportation (Non-Medical): No  Physical Activity: Insufficiently Active (01/24/2023)   Exercise Vital Sign    Days of Exercise per Week: 3 days    Minutes of Exercise per Session: 30 min  Stress: No Stress Concern Present (01/24/2023)   [employer] of Occupational Health - Occupational Stress Questionnaire    Feeling of Stress : Not at all  Social Connections: Socially Integrated (01/24/2023)   Social Connection and Isolation Panel [NHANES]    Frequency of Communication with Friends and Family: More than three times a week    Frequency of Social Gatherings with Friends and Family: More than three times a week    Attends Religious Services: More than 4 times per year    Active Member of [employer] or Organizations: Yes    Attends Banker Meetings: More than 4 times per year    Marital Status: Married  Catering manager Violence: Unknown (05/07/2021)   Received from [facility]   HITS    Physically Hurt: Not on file    Insult or Talk Down To: Not on file    Threaten Physical Harm: Not on file    Scream or Curse: Not on file    Outpatient Medications Prior to Visit  Medication Sig Dispense Refill   albuterol (PROAIR HFA) 108 (90 Base) MCG/ACT inhaler USE 1-2 PUFFS BY MOUTH EVERY 4 HOURS AS NEEDED wheezing and shortness of breath 3 each 0   amLODipine (NORVASC) 5 MG tablet TAKE 1 TABLET BY MOUTH EVERY DAY 90 tablet 1   buPROPion (WELLBUTRIN SR) 200 MG 12 hr tablet TAKE 1 TABLET BY MOUTH TWICE A DAY 180 tablet 0   estradiol (VIVELLE-DOT) 0.1  MG/24HR patch Place 1 patch onto the skin 2 (two) times a week.     Phentermine HCl (LOMAIRA) 8 MG TABS Take 1 tablet (8 mg total) by mouth 2 (two) times daily. 60 tablet 0   No facility-administered medications prior to visit.    Allergies  Allergen Reactions   Grass Pollen(K-O-R-T-Swt Vern) Shortness Of Breath   Phenol Shortness Of Breath  Other     Almonds    ROS    See HPI Objective:    Physical Exam Constitutional:      Appearance: She is well-developed.  Cardiovascular:     Rate and Rhythm: Normal rate and regular rhythm.     Heart sounds: Normal heart sounds. No murmur heard. Pulmonary:     Effort: Pulmonary effort is normal. No respiratory distress.     Breath sounds: Normal breath sounds. No wheezing.  Psychiatric:        Behavior: Behavior normal.        Thought Content: Thought content normal.        Judgment: Judgment normal.      BP 129/80 (BP Location: Right Arm, Patient Position: Sitting, Cuff Size: Large)   Pulse 74   Temp 98.3 F (36.8 C) (Oral)   Resp 16   Ht 5\' 2"  (1.575 m)   Wt 188 lb (85.3 kg)   LMP 06/04/2017 Comment: "saw some spotting "   SpO2 100%   BMI 34.39 kg/m  Wt Readings from Last 3 Encounters:  01/24/23 188 lb (85.3 kg)  01/05/23 181 lb (82.1 kg)  12/15/22 181 lb (82.1 kg)       Assessment & Plan:   Problem List Items Addressed This Visit       Unprioritized   S/P TAH (total abdominal hysterectomy)    Stable on Estradiol patch prescribed by gynecologist. -Continue Estradiol patch as prescribed.      Prediabetes - Primary   Lab Results  Component Value Date   HGBA1C 5.6 03/03/2022         Obesity (BMI 30.0-34.9)    Active participation in the healthy weight and wellness program. Occasional use of Phentermine. No recent A1c, but weight loss since last normal A1c suggests likely improvement. -Continue with healthy weight and wellness program.       Hypertension    Blood pressure controlled on Amlodipine  5mg  daily. No reported side effects. -Continue Amlodipine 5mg  daily.      Asthma    Recent use of Albuterol inhaler due to exposure to triggers at social events. No wheezing on exam. -Continue current asthma management plan.        I am having Tehilla Debera Lat maintain her estradiol, albuterol, buPROPion, amLODipine, and Lomaira.  No orders of the defined types were placed in this encounter.

## 2023-01-24 NOTE — Assessment & Plan Note (Addendum)
  Recent use of Albuterol inhaler due to exposure to triggers at social events. No wheezing on exam. -Continue current asthma management plan.

## 2023-01-24 NOTE — Assessment & Plan Note (Signed)
Lab Results  Component Value Date   HGBA1C 5.6 03/03/2022

## 2023-01-24 NOTE — Assessment & Plan Note (Signed)
  Stable on Estradiol patch prescribed by gynecologist. -Continue Estradiol patch as prescribed.

## 2023-01-24 NOTE — Patient Instructions (Signed)
VISIT SUMMARY:  During your routine follow-up visit, we discussed your progress with weight management, hypertension, asthma, and menopausal symptoms. You have been doing well with the healthy weight and wellness program, and your blood pressure is well-controlled. Your asthma has been stable, though you needed your inhaler recently due to allergen exposure. Your menopausal symptoms are well-managed with the estradiol patch. We also discussed the importance of getting the latest COVID-19 booster.  YOUR PLAN:  -WEIGHT MANAGEMENT: You are actively participating in the healthy weight and wellness program and occasionally using Phentermine. Your weight loss since the last visit is a positive sign. Continue with the current program.  -HYPERTENSION: Your blood pressure is well-controlled with Amlodipine 5mg  daily, and you have no side effects. Continue taking Amlodipine as prescribed.  -ASTHMA: Your asthma has been stable, but you needed your Albuterol inhaler recently due to allergen exposure. Continue with your current asthma management plan.  -MENOPAUSAL SYMPTOMS: Your menopausal symptoms are well-managed with the Estradiol patch prescribed by your gynecologist. Continue using the Estradiol patch as directed.  -COVID-19 VACCINATION: You have not received the latest COVID-19 booster. It is recommended to get the booster at your local pharmacy.  -GENERAL HEALTH MAINTENANCE: We will plan for a physical exam in six months and check your blood pressure at the next visit.  INSTRUCTIONS:  Please schedule a physical exam in six months and ensure you get your blood pressure checked at your next visit. Additionally, obtain the latest COVID-19 booster at your local pharmacy.

## 2023-02-16 ENCOUNTER — Ambulatory Visit: Payer: 59 | Admitting: Nurse Practitioner

## 2023-02-16 ENCOUNTER — Encounter: Payer: Self-pay | Admitting: Nurse Practitioner

## 2023-02-16 VITALS — BP 121/82 | HR 70 | Temp 98.2°F | Ht 62.0 in | Wt 184.0 lb

## 2023-02-16 DIAGNOSIS — R638 Other symptoms and signs concerning food and fluid intake: Secondary | ICD-10-CM

## 2023-02-16 DIAGNOSIS — Z6833 Body mass index (BMI) 33.0-33.9, adult: Secondary | ICD-10-CM | POA: Diagnosis not present

## 2023-02-16 DIAGNOSIS — R632 Polyphagia: Secondary | ICD-10-CM

## 2023-02-16 DIAGNOSIS — E66811 Obesity, class 1: Secondary | ICD-10-CM

## 2023-02-16 DIAGNOSIS — E669 Obesity, unspecified: Secondary | ICD-10-CM

## 2023-02-16 MED ORDER — LOMAIRA 8 MG PO TABS
1.0000 | ORAL_TABLET | Freq: Two times a day (BID) | ORAL | 0 refills | Status: DC
Start: 2023-02-16 — End: 2023-03-16

## 2023-02-16 MED ORDER — BUPROPION HCL ER (SR) 150 MG PO TB12: 150.0000 mg | ORAL_TABLET | Freq: Every day | ORAL | 0 refills | Status: DC

## 2023-02-16 NOTE — Progress Notes (Signed)
 Office: [phone]  /  Fax: [phone]  WEIGHT SUMMARY AND BIOMETRICS  Weight Lost Since Last Visit: 0lb  Weight Gained Since Last Visit: 3lb   Vitals Temp: 98.2 F (36.8 C) BP: 121/82 Pulse Rate: 70 SpO2: 98 %   Anthropometric Measurements Height: 5\' 2"  (1.575 m) Weight: 184 lb (83.5 kg) BMI (Calculated): 33.65 Weight at Last Visit: 181lb Weight Lost Since Last Visit: 0lb Weight Gained Since Last Visit: 3lb Starting Weight: 205lb Total Weight Loss (lbs): 21 lb (9.526 kg)   Body Composition  Body Fat %: 34.4 % Fat Mass (lbs): 63.2 lbs Muscle Mass (lbs): 114.6 lbs Total Body Water (lbs): 74.4 lbs Visceral Fat Rating : 8   Other Clinical Data Fasting: Yes Labs: No Today's Visit #: 35 Starting Date: 03/26/22     HPI  Chief Complaint: OBESITY  Amber Stephens is here to discuss her progress with her obesity treatment plan. She is on the the Category 2 Plan and states she is following her eating plan approximately 10 % of the time. She states she is exercising 30-40 minutes 1 days per week.   Interval History:  Since last office visit she has gained 3 pounds.  She was out of work for a month due to the holidays and got off track. Has been eating more "junk food".  She has started back to work and is going to school herself. Feels like she is going to be able to get back on track and getting back into a routine since the holidays are over.  She got a bike for Christmas and has a treadmill at home.  She is drinking water but not enough.     No upcoming traveling or celebrations.   Pharmacotherapy for weight loss: She is currently taking Wellbutrin  SR 200mg  at breakfast for cravings and Lomaira  8mg  in the afternoon.  Reports side effect of tremors in her hands-more on left then right.  Helps with polyphagia.     Previous pharmacotherapy for medical weight loss:  Lomaira    Bariatric surgery:  Patient has not had bariatric surgery.   PHYSICAL EXAM:  Blood  pressure 121/82, pulse 70, temperature 98.2 F (36.8 C), height 5\' 2"  (1.575 m), weight 184 lb (83.5 kg), last menstrual period 06/04/2017, SpO2 98%. Body mass index is 33.65 kg/m.  General: She is overweight, cooperative, alert, well developed, and in no acute distress. PSYCH: Has normal mood, affect and thought process.   Extremities: No edema.  Neurologic: No gross sensory or motor deficits. No tremors or fasciculations noted.    DIAGNOSTIC DATA REVIEWED:  BMET    Component Value Date/Time   NA 141 01/05/2023 1042   K 4.5 01/05/2023 1042   CL 104 01/05/2023 1042   CO2 25 01/05/2023 1042   GLUCOSE 86 01/05/2023 1042   GLUCOSE 89 05/28/2020 0831   BUN 15 01/05/2023 1042   CREATININE 1.01 (H) 01/05/2023 1042   CALCIUM 9.6 01/05/2023 1042   GFRNONAA >60 06/07/2017 0915   GFRAA >60 06/07/2017 0915   Lab Results  Component Value Date   HGBA1C 5.6 03/03/2022   HGBA1C 5.7 (H) 03/26/2021   Lab Results  Component Value Date   INSULIN  8.4 03/03/2022   INSULIN  19.1 03/26/2021   Lab Results  Component Value Date   TSH 0.701 05/12/2022   CBC    Component Value Date/Time   WBC 8.4 03/03/2022 1003   WBC 5.1 04/03/2019 1005   RBC 4.68 03/03/2022 1003   RBC 4.36 04/03/2019 1005  HGB 13.6 03/03/2022 1003   HCT 41.1 03/03/2022 1003   PLT 429 03/03/2022 1003   MCV 88 03/03/2022 1003   MCH 29.1 03/03/2022 1003   MCH 28.8 06/17/2017 0443   MCHC 33.1 03/03/2022 1003   MCHC 33.6 04/03/2019 1005   RDW 12.6 03/03/2022 1003   Iron Studies    Component Value Date/Time   IRON 106 10/30/2012 1137   IRONPCTSAT 28.6 10/30/2012 1137   Lipid Panel     Component Value Date/Time   CHOL 164 03/03/2022 1003   TRIG 53 03/03/2022 1003   HDL 47 03/03/2022 1003   CHOLHDL 3 05/28/2020 0831   VLDL 11.4 05/28/2020 0831   LDLCALC 106 (H) 03/03/2022 1003   Hepatic Function Panel     Component Value Date/Time   PROT 7.0 05/12/2022 1138   ALBUMIN 4.2 05/12/2022 1138   AST 16  05/12/2022 1138   ALT 20 05/12/2022 1138   ALKPHOS 56 05/12/2022 1138   BILITOT 0.3 05/12/2022 1138   BILIDIR 0.1 04/03/2019 1005      Component Value Date/Time   TSH 0.701 05/12/2022 1138   Nutritional Lab Results  Component Value Date   VD25OH 83.1 03/03/2022   VD25OH 72.4 08/05/2021   VD25OH 25.8 (L) 03/26/2021     ASSESSMENT AND PLAN  TREATMENT PLAN FOR OBESITY:  Recommended Dietary Goals  Amber Stephens is currently in the action stage of change. As such, her goal is to continue weight management plan. She has agreed to keeping a food journal and adhering to recommended goals of 1200 calories and 80+ protein.  Behavioral Intervention  We discussed the following Behavioral Modification Strategies today: increasing lean protein intake to established goals, decreasing simple carbohydrates , increasing vegetables, increasing water intake , work on meal planning and preparation, work on tracking and journaling calories using tracking application, reading food labels , keeping healthy foods at home, continue to work on implementation of reduced calorie nutritional plan, continue to practice mindfulness when eating, planning for success, and continue to work on maintaining a reduced calorie state, getting the recommended amount of protein, incorporating whole foods, making healthy choices, staying well hydrated and practicing mindfulness when eating..  Additional resources provided today: NA  Recommended Physical Activity Goals  Amber Stephens has been advised to work up to 150 minutes of moderate intensity aerobic activity a week and strengthening exercises 2-3 times per week for cardiovascular health, weight loss maintenance and preservation of muscle mass.   She has agreed to Think about enjoyable ways to increase daily physical activity and overcoming barriers to exercise, Increase physical activity in their day and reduce sedentary time (increase NEAT)., Increase the intensity, frequency  or duration of strengthening exercises , and Increase the intensity, frequency or duration of aerobic exercises     Pharmacotherapy We discussed various medication options to help Amber Stephens with her weight loss efforts and we both agreed to decrease Wellbutrin  to 150mg  (to see if will help with tremor) and continue Lomaira  8mg  1-2 times daily.  Side effects discussed.   ASSOCIATED CONDITIONS ADDRESSED TODAY  Action/Plan  Polyphagia -     Lomaira ; Take 1 tablet (8 mg total) by mouth 2 (two) times daily.  Dispense: 60 tablet; Refill: 0  Abnormal craving -     buPROPion  HCl ER (SR); Take 1 tablet (150 mg total) by mouth daily.  Dispense: 30 tablet; Refill: 0  Generalized obesity -     Lomaira ; Take 1 tablet (8 mg total) by mouth 2 (two) times daily.  Dispense: 60 tablet; Refill: 0  Obesity (BMI 30.0-34.9)      Will obtain fasting labs at next visit   Return in about 4 weeks (around 03/16/2023).Aaron Aas She was informed of the importance of frequent follow up visits to maximize her success with intensive lifestyle modifications for her multiple health conditions.   ATTESTASTION STATEMENTS:  Reviewed by clinician on day of visit: allergies, medications, problem list, medical history, surgical history, family history, social history, and previous encounter notes.     Crist Dominion. Mina Babula FNP-C

## 2023-03-10 ENCOUNTER — Other Ambulatory Visit: Payer: Self-pay | Admitting: Nurse Practitioner

## 2023-03-10 DIAGNOSIS — R638 Other symptoms and signs concerning food and fluid intake: Secondary | ICD-10-CM

## 2023-03-16 ENCOUNTER — Other Ambulatory Visit: Payer: Self-pay | Admitting: Nurse Practitioner

## 2023-03-16 ENCOUNTER — Ambulatory Visit: Payer: 59 | Admitting: Nurse Practitioner

## 2023-03-16 ENCOUNTER — Encounter: Payer: Self-pay | Admitting: Nurse Practitioner

## 2023-03-16 VITALS — BP 121/83 | HR 70 | Temp 98.0°F | Ht 62.0 in | Wt 183.0 lb

## 2023-03-16 DIAGNOSIS — R638 Other symptoms and signs concerning food and fluid intake: Secondary | ICD-10-CM

## 2023-03-16 DIAGNOSIS — Z79899 Other long term (current) drug therapy: Secondary | ICD-10-CM

## 2023-03-16 DIAGNOSIS — I10 Essential (primary) hypertension: Secondary | ICD-10-CM

## 2023-03-16 DIAGNOSIS — E559 Vitamin D deficiency, unspecified: Secondary | ICD-10-CM

## 2023-03-16 DIAGNOSIS — Z6833 Body mass index (BMI) 33.0-33.9, adult: Secondary | ICD-10-CM

## 2023-03-16 DIAGNOSIS — R7303 Prediabetes: Secondary | ICD-10-CM

## 2023-03-16 DIAGNOSIS — E7849 Other hyperlipidemia: Secondary | ICD-10-CM | POA: Diagnosis not present

## 2023-03-16 DIAGNOSIS — R632 Polyphagia: Secondary | ICD-10-CM

## 2023-03-16 DIAGNOSIS — E66811 Obesity, class 1: Secondary | ICD-10-CM

## 2023-03-16 MED ORDER — LOMAIRA 8 MG PO TABS: 1.0000 | ORAL_TABLET | Freq: Two times a day (BID) | ORAL | 0 refills | Status: DC

## 2023-03-16 MED ORDER — BUPROPION HCL ER (SR) 150 MG PO TB12: 150.0000 mg | ORAL_TABLET | Freq: Every day | ORAL | 0 refills | Status: DC

## 2023-03-16 NOTE — Progress Notes (Signed)
Office: [phone]  /  Fax: [phone]  WEIGHT SUMMARY AND BIOMETRICS  Weight Lost Since Last Visit: 1lb  Weight Gained Since Last Visit: 0lb   Vitals Temp: 98 F (36.7 C) BP: 121/83 Pulse Rate: 70 SpO2: 100 %   Anthropometric Measurements Height: 5\' 2"  (1.575 m) Weight: 183 lb (83 kg) BMI (Calculated): 33.46 Weight at Last Visit: 184lb Weight Lost Since Last Visit: 1lb Weight Gained Since Last Visit: 0lb Starting Weight: 205lb Total Weight Loss (lbs): 22 lb (9.979 kg)   Body Composition  Body Fat %: 34.6 % Fat Mass (lbs): 63.4 lbs Muscle Mass (lbs): 113.8 lbs Total Body Water (lbs): 73.4 lbs Visceral Fat Rating : 8   Other Clinical Data Fasting: No Labs: No Today's Visit #: 36 Starting Date: 03/26/22     HPI  Chief Complaint: OBESITY  Amber Stephens is here to discuss her progress with her obesity treatment plan. She is on the the Category 2 Plan and states she is following her eating plan approximately 60 % of the time. She states she is exercising 0 minutes 0 days per week.   Interval History:  Since last office visit she has lost 1 pound.  She is substituting a protein shake for breakfast and lunch. She is snacking on fruit and cheese and is drinking water daily. She is eating a protein, 2 vegetables and dessert for dinner.  She is averaging around 1200 calories and 100 grams of protein.  Got off track on superbowl Sunday.  She has been cooking more at home.   She plans to start exercising.    Pharmacotherapy for weight loss: She is currently taking Wellbutrin SR 150 mg at breakfast (7am) for cravings and Lomaira 8mg  daily (2pm).  She did take Lomaira BID once last week and did well-forgets to take BID.  Rare side effects of tremors with Lomaira BID with Wellbutrin.  Helps with polyphagia.     Previous pharmacotherapy for medical weight loss:  Lomaira   Bariatric surgery:  Patient has not had bariatric surgery.    Hypertension Hypertension stable.   Medication(s): Norvasc 5mg .  Denies side effects.  Denies chest pain, palpitations and SOB.  BP Readings from Last 3 Encounters:  03/16/23 121/83  02/16/23 121/82  01/24/23 129/80   Lab Results  Component Value Date   CREATININE 1.01 (H) 01/05/2023   CREATININE 0.92 08/17/2022   CREATININE 1.02 (H) 05/12/2022    Hyperlipidemia Medication(s): None.   Lab Results  Component Value Date   CHOL 164 03/03/2022   HDL 47 03/03/2022   LDLCALC 106 (H) 03/03/2022   TRIG 53 03/03/2022   CHOLHDL 3 05/28/2020   Lab Results  Component Value Date   ALT 20 05/12/2022   AST 16 05/12/2022   ALKPHOS 56 05/12/2022   BILITOT 0.3 05/12/2022   The 10-year ASCVD risk score (Arnett DK, et al., 2019) is: 2.5%   Values used to calculate the score:     Age: 48 years     Sex: Female     Is Non-Hispanic African American: Yes     Diabetic: No     Tobacco smoker: No     Systolic Blood Pressure: 121 mmHg     Is BP treated: Yes     HDL Cholesterol: 47 mg/dL     Total Cholesterol: 164 mg/dL   Vit D deficiency  She is taking Vit D 50,000 IU weekly.  Denies side effects.  Denies nausea, vomiting or muscle weakness.    Lab  Results  Component Value Date   VD25OH 83.1 03/03/2022   VD25OH 72.4 08/05/2021   VD25OH 25.8 (L) 03/26/2021     PHYSICAL EXAM:  Blood pressure 121/83, pulse 70, temperature 98 F (36.7 C), height 5\' 2"  (1.575 m), weight 183 lb (83 kg), last menstrual period 06/04/2017, SpO2 100%. Body mass index is 33.47 kg/m.  General: She is overweight, cooperative, alert, well developed, and in no acute distress. PSYCH: Has normal mood, affect and thought process.   Extremities: No edema.  Neurologic: No gross sensory or motor deficits. No tremors or fasciculations noted.    DIAGNOSTIC DATA REVIEWED:  BMET    Component Value Date/Time   NA 141 01/05/2023 1042   K 4.5 01/05/2023 1042   CL 104 01/05/2023 1042   CO2 25 01/05/2023 1042   GLUCOSE 86 01/05/2023 1042   GLUCOSE  89 05/28/2020 0831   BUN 15 01/05/2023 1042   CREATININE 1.01 (H) 01/05/2023 1042   CALCIUM 9.6 01/05/2023 1042   GFRNONAA >60 06/07/2017 0915   GFRAA >60 06/07/2017 0915   Lab Results  Component Value Date   HGBA1C 5.6 03/03/2022   HGBA1C 5.7 (H) 03/26/2021   Lab Results  Component Value Date   INSULIN 8.4 03/03/2022   INSULIN 19.1 03/26/2021   Lab Results  Component Value Date   TSH 0.701 05/12/2022   CBC    Component Value Date/Time   WBC 8.4 03/03/2022 1003   WBC 5.1 04/03/2019 1005   RBC 4.68 03/03/2022 1003   RBC 4.36 04/03/2019 1005   HGB 13.6 03/03/2022 1003   HCT 41.1 03/03/2022 1003   PLT 429 03/03/2022 1003   MCV 88 03/03/2022 1003   MCH 29.1 03/03/2022 1003   MCH 28.8 06/17/2017 0443   MCHC 33.1 03/03/2022 1003   MCHC 33.6 04/03/2019 1005   RDW 12.6 03/03/2022 1003   Iron Studies    Component Value Date/Time   IRON 106 10/30/2012 1137   IRONPCTSAT 28.6 10/30/2012 1137   Lipid Panel     Component Value Date/Time   CHOL 164 03/03/2022 1003   TRIG 53 03/03/2022 1003   HDL 47 03/03/2022 1003   CHOLHDL 3 05/28/2020 0831   VLDL 11.4 05/28/2020 0831   LDLCALC 106 (H) 03/03/2022 1003   Hepatic Function Panel     Component Value Date/Time   PROT 7.0 05/12/2022 1138   ALBUMIN 4.2 05/12/2022 1138   AST 16 05/12/2022 1138   ALT 20 05/12/2022 1138   ALKPHOS 56 05/12/2022 1138   BILITOT 0.3 05/12/2022 1138   BILIDIR 0.1 04/03/2019 1005      Component Value Date/Time   TSH 0.701 05/12/2022 1138   Nutritional Lab Results  Component Value Date   VD25OH 83.1 03/03/2022   VD25OH 72.4 08/05/2021   VD25OH 25.8 (L) 03/26/2021     ASSESSMENT AND PLAN  TREATMENT PLAN FOR OBESITY:  Recommended Dietary Goals  Amber Stephens is currently in the action stage of change. As such, her goal is to continue weight management plan. She has agreed to the Category 2 Plan.  Increase protein intake.    Behavioral Intervention  We discussed the following  Behavioral Modification Strategies today: increasing lean protein intake to established goals, decreasing simple carbohydrates , increasing vegetables, increasing water intake , work on meal planning and preparation, reading food labels , keeping healthy foods at home, continue to work on implementation of reduced calorie nutritional plan, continue to practice mindfulness when eating, planning for success, and continue to work  on maintaining a reduced calorie state, getting the recommended amount of protein, incorporating whole foods, making healthy choices, staying well hydrated and practicing mindfulness when eating..  Additional resources provided today: NA  Recommended Physical Activity Goals  Aarian has been advised to work up to 150 minutes of moderate intensity aerobic activity a week and strengthening exercises 2-3 times per week for cardiovascular health, weight loss maintenance and preservation of muscle mass.   She has agreed to Think about enjoyable ways to increase daily physical activity and overcoming barriers to exercise, Increase physical activity in their day and reduce sedentary time (increase NEAT)., and Work on scheduling and tracking physical activity.    Pharmacotherapy We discussed various medication options to help Solenne with her weight loss efforts and we both agreed to take Lomaira 8mg  BID and Wellbutrin SR 150mg .   ASSOCIATED CONDITIONS ADDRESSED TODAY  Action/Plan  Prediabetes -     Hemoglobin A1c -     Insulin, random  Primary hypertension Continue to follow-up with PCP.  Continue medications as directed.  Other hyperlipidemia -     Lipid Panel With LDL/HDL Ratio  Cardiovascular risk and specific lipid/LDL goals reviewed.  We discussed several lifestyle modifications today and Tanaysia will continue to work on diet, exercise and weight loss efforts. Orders and follow up as documented in patient record.   Counseling Intensive lifestyle modifications are  the first line treatment for this issue. Dietary changes: Increase soluble fiber. Decrease simple carbohydrates. Exercise changes: Moderate to vigorous-intensity aerobic activity 150 minutes per week if tolerated. Lipid-lowering medications: see documented in medical record.   Vitamin D deficiency -     VITAMIN D 25 Hydroxy (Vit-D Deficiency, Fractures)  Low Vitamin D level contributes to fatigue and are associated with obesity, breast, and colon cancer. She agrees to continue to take prescription Vitamin D @50 ,000 IU every week and will follow-up for routine testing of Vitamin D, at least 2-3 times per year to avoid over-replacement.   Abnormal craving -     buPROPion HCl ER (SR); Take 1 tablet (150 mg total) by mouth daily.  Dispense: 30 tablet; Refill: 0.  Side effects discussed.  Polyphagia Lasandra Beech; Take 1 tablet (8 mg total) by mouth 2 (two) times daily.  Dispense: 60 tablet; Refill: 0  Medication management -     Comprehensive metabolic panel -     TSH -     CBC with Differential/Platelet  Class 1 obesity due to excess calories without serious comorbidity with body mass index (BMI) of 33.0 to 33.9 in adult -     TSH -     Lomaira; Take 1 tablet (8 mg total) by mouth 2 (two) times daily.  Dispense: 60 tablet; Refill: 0     Options discussed today Continue Lomaira 8mg  once daily and Wellbutin SR 150mg  Take Lomaira 8mg  BID (twice per day) and Wellbutin SR 150mg -monitor for worsening of tremors Take Lomaira 8mg  BID (twice per day) and decrease Wellbutrin SR 100mg  if notices an increase in tremors Take Lomaira 8mg  BID and change to Wellbutrin XL (extended release) 150mg  daily.   To send me a message via mychart in 2 weeks to let me know how she is doing.   Return in about 4 weeks (around 04/13/2023).Marland Kitchen She was informed of the importance of frequent follow up visits to maximize her success with intensive lifestyle modifications for her multiple health  conditions.   ATTESTASTION STATEMENTS:  Reviewed by clinician on day of  visit: allergies, medications, problem list, medical history, surgical history, family history, social history, and previous encounter notes.     Theodis Sato. Wissam Resor FNP-C

## 2023-03-17 LAB — COMPREHENSIVE METABOLIC PANEL WITH GFR
ALT: 15 IU/L (ref 0–32)
AST: 18 IU/L (ref 0–40)
Albumin: 4.4 g/dL (ref 3.9–4.9)
Alkaline Phosphatase: 56 IU/L (ref 44–121)
BUN/Creatinine Ratio: 14 (ref 9–23)
BUN: 16 mg/dL (ref 6–24)
Bilirubin Total: 0.4 mg/dL (ref 0.0–1.2)
CO2: 23 mmol/L (ref 20–29)
Calcium: 9.4 mg/dL (ref 8.7–10.2)
Chloride: 101 mmol/L (ref 96–106)
Creatinine, Ser: 1.13 mg/dL — ABNORMAL HIGH (ref 0.57–1.00)
Globulin, Total: 2.8 g/dL (ref 1.5–4.5)
Glucose: 91 mg/dL (ref 70–99)
Potassium: 4.6 mmol/L (ref 3.5–5.2)
Sodium: 139 mmol/L (ref 134–144)
Total Protein: 7.2 g/dL (ref 6.0–8.5)
eGFR: 60 mL/min/1.73

## 2023-03-17 LAB — CBC WITH DIFFERENTIAL/PLATELET
Basophils Absolute: 0.1 x10E3/uL (ref 0.0–0.2)
Basos: 1 %
EOS (ABSOLUTE): 0.2 x10E3/uL (ref 0.0–0.4)
Eos: 4 %
Hematocrit: 41.7 % (ref 34.0–46.6)
Hemoglobin: 13.9 g/dL (ref 11.1–15.9)
Immature Grans (Abs): 0 x10E3/uL (ref 0.0–0.1)
Immature Granulocytes: 0 %
Lymphocytes Absolute: 2.2 x10E3/uL (ref 0.7–3.1)
Lymphs: 37 %
MCH: 30 pg (ref 26.6–33.0)
MCHC: 33.3 g/dL (ref 31.5–35.7)
MCV: 90 fL (ref 79–97)
Monocytes Absolute: 0.4 x10E3/uL (ref 0.1–0.9)
Monocytes: 8 %
Neutrophils Absolute: 2.9 x10E3/uL (ref 1.4–7.0)
Neutrophils: 50 %
Platelets: 366 x10E3/uL (ref 150–450)
RBC: 4.64 x10E6/uL (ref 3.77–5.28)
RDW: 12.4 % (ref 11.7–15.4)
WBC: 5.8 x10E3/uL (ref 3.4–10.8)

## 2023-03-17 LAB — LIPID PANEL WITH LDL/HDL RATIO
Cholesterol, Total: 160 mg/dL (ref 100–199)
HDL: 53 mg/dL (ref >39)
LDL Chol Calc (NIH): 94 mg/dL (ref 0–99)
LDL/HDL Ratio: 1.8 {ratio} (ref 0.0–3.2)
Triglycerides: 69 mg/dL (ref 0–149)
VLDL Cholesterol Cal: 13 mg/dL (ref 5–40)

## 2023-03-17 LAB — INSULIN, RANDOM: INSULIN: 8.3 u[IU]/mL (ref 2.6–24.9)

## 2023-03-17 LAB — HEMOGLOBIN A1C
Est. average glucose Bld gHb Est-mCnc: 120 mg/dL
Hgb A1c MFr Bld: 5.8 % — ABNORMAL HIGH (ref 4.8–5.6)

## 2023-03-17 LAB — TSH: TSH: 0.741 u[IU]/mL (ref 0.450–4.500)

## 2023-03-17 LAB — VITAMIN D 25 HYDROXY (VIT D DEFICIENCY, FRACTURES): Vit D, 25-Hydroxy: 39 ng/mL (ref 30.0–100.0)

## 2023-04-13 ENCOUNTER — Ambulatory Visit: Payer: 59 | Admitting: Nurse Practitioner

## 2023-04-13 ENCOUNTER — Encounter: Payer: Self-pay | Admitting: Nurse Practitioner

## 2023-04-13 VITALS — BP 116/80 | HR 70 | Temp 98.4°F | Ht 62.0 in | Wt 185.0 lb

## 2023-04-13 DIAGNOSIS — E6609 Other obesity due to excess calories: Secondary | ICD-10-CM

## 2023-04-13 DIAGNOSIS — R638 Other symptoms and signs concerning food and fluid intake: Secondary | ICD-10-CM

## 2023-04-13 DIAGNOSIS — R7303 Prediabetes: Secondary | ICD-10-CM

## 2023-04-13 DIAGNOSIS — E559 Vitamin D deficiency, unspecified: Secondary | ICD-10-CM

## 2023-04-13 DIAGNOSIS — R632 Polyphagia: Secondary | ICD-10-CM

## 2023-04-13 DIAGNOSIS — R748 Abnormal levels of other serum enzymes: Secondary | ICD-10-CM

## 2023-04-13 DIAGNOSIS — E66811 Obesity, class 1: Secondary | ICD-10-CM

## 2023-04-13 DIAGNOSIS — Z6833 Body mass index (BMI) 33.0-33.9, adult: Secondary | ICD-10-CM

## 2023-04-13 MED ORDER — LOMAIRA 8 MG PO TABS: 1.0000 | ORAL_TABLET | Freq: Two times a day (BID) | ORAL | 0 refills | Status: DC

## 2023-04-13 NOTE — Progress Notes (Signed)
 Office: [phone]  /  Fax: [phone]  WEIGHT SUMMARY AND BIOMETRICS  Weight Lost Since Last Visit: 0lb  Weight Gained Since Last Visit: 2lb   Vitals Temp: 98.4 F (36.9 C) BP: 116/80 Pulse Rate: 70 SpO2: 99 %   Anthropometric Measurements Height: 5\' 2"  (1.575 m) Weight: 185 lb (83.9 kg) BMI (Calculated): 33.83 Weight at Last Visit: 183lb Weight Lost Since Last Visit: 0lb Weight Gained Since Last Visit: 2lb Starting Weight: 205lb Total Weight Loss (lbs): 20 lb (9.072 kg)   Body Composition  Body Fat %: 35 % Fat Mass (lbs): 64.8 lbs Muscle Mass (lbs): 114.2 lbs Total Body Water (lbs): 75.2 lbs Visceral Fat Rating : 9   Other Clinical Data Fasting: No Labs: No Today's Visit #: 37 Starting Date: 03/26/22     HPI  Chief Complaint: OBESITY  Amber Stephens is here to discuss her progress with her obesity treatment plan. She is on the the Category 2 Plan and states she is following her eating plan approximately 80 % of the time. She states she is exercising 30 minutes 3 days per week.   Interval History:  Since last office visit she has gained 2 pounds. She stopped sugar for lent.  She is drinking water, protein shakes and occ diet soda.  She started walking more this week-has been walking the past 3 days on her treadmill.    She is going on a cruise in May  Pharmacotherapy for weight loss: She is currently taking Wellbutrin SR 150 mg at breakfast (7am) for cravings and Lomaira 8mg  daily (2pm)-tried to increase to BID and stopped due to tremors.  She feels with Lomaira BID helps more with polyphagia and cravings/snacking then if she takes it once daily.      Previous pharmacotherapy for medical weight loss:  Lomaira   Bariatric surgery:  Patient has not had bariatric surgery.    Prediabetes Last A1c was 5.8-worsening Medication(s): None Polyphagia:Yes Lab Results  Component Value Date   HGBA1C 5.8 (H) 03/16/2023   HGBA1C 5.6 03/03/2022   HGBA1C 5.5  08/05/2021   HGBA1C 5.7 (H) 03/26/2021   Lab Results  Component Value Date   INSULIN 8.3 03/16/2023   INSULIN 8.4 03/03/2022   INSULIN 11.3 08/05/2021   INSULIN 19.1 03/26/2021    Vit D deficiency  She is not taking Vit D   Lab Results  Component Value Date   VD25OH 39.0 03/16/2023   VD25OH 83.1 03/03/2022   VD25OH 72.4 08/05/2021    Elevated creat Last creat level was 1.13.  took some OTC meds prior to labs.  Denies alcohol intake.   PHYSICAL EXAM:  Blood pressure 116/80, pulse 70, temperature 98.4 F (36.9 C), height 5\' 2"  (1.575 m), weight 185 lb (83.9 kg), last menstrual period 06/04/2017, SpO2 99%. Body mass index is 33.84 kg/m.  General: She is overweight, cooperative, alert, well developed, and in no acute distress. PSYCH: Has normal mood, affect and thought process.   Extremities: No edema.  Neurologic: No gross sensory or motor deficits. No tremors or fasciculations noted.    DIAGNOSTIC DATA REVIEWED:  BMET    Component Value Date/Time   NA 139 03/16/2023 1050   K 4.6 03/16/2023 1050   CL 101 03/16/2023 1050   CO2 23 03/16/2023 1050   GLUCOSE 91 03/16/2023 1050   GLUCOSE 89 05/28/2020 0831   BUN 16 03/16/2023 1050   CREATININE 1.13 (H) 03/16/2023 1050   CALCIUM 9.4 03/16/2023 1050   GFRNONAA >60 06/07/2017 0915  GFRAA >60 06/07/2017 0915   Lab Results  Component Value Date   HGBA1C 5.8 (H) 03/16/2023   HGBA1C 5.7 (H) 03/26/2021   Lab Results  Component Value Date   INSULIN 8.3 03/16/2023   INSULIN 19.1 03/26/2021   Lab Results  Component Value Date   TSH 0.741 03/16/2023   CBC    Component Value Date/Time   WBC 5.8 03/16/2023 1050   WBC 5.1 04/03/2019 1005   RBC 4.64 03/16/2023 1050   RBC 4.36 04/03/2019 1005   HGB 13.9 03/16/2023 1050   HCT 41.7 03/16/2023 1050   PLT 366 03/16/2023 1050   MCV 90 03/16/2023 1050   MCH 30.0 03/16/2023 1050   MCH 28.8 06/17/2017 0443   MCHC 33.3 03/16/2023 1050   MCHC 33.6 04/03/2019 1005   RDW  12.4 03/16/2023 1050   Iron Studies    Component Value Date/Time   IRON 106 10/30/2012 1137   IRONPCTSAT 28.6 10/30/2012 1137   Lipid Panel     Component Value Date/Time   CHOL 160 03/16/2023 1050   TRIG 69 03/16/2023 1050   HDL 53 03/16/2023 1050   CHOLHDL 3 05/28/2020 0831   VLDL 11.4 05/28/2020 0831   LDLCALC 94 03/16/2023 1050   Hepatic Function Panel     Component Value Date/Time   PROT 7.2 03/16/2023 1050   ALBUMIN 4.4 03/16/2023 1050   AST 18 03/16/2023 1050   ALT 15 03/16/2023 1050   ALKPHOS 56 03/16/2023 1050   BILITOT 0.4 03/16/2023 1050   BILIDIR 0.1 04/03/2019 1005      Component Value Date/Time   TSH 0.741 03/16/2023 1050   Nutritional Lab Results  Component Value Date   VD25OH 39.0 03/16/2023   VD25OH 83.1 03/03/2022   VD25OH 72.4 08/05/2021     ASSESSMENT AND PLAN  TREATMENT PLAN FOR OBESITY:  Recommended Dietary Goals  Amber Stephens is currently in the action stage of change. As such, her goal is to continue weight management plan. She has agreed to track and will send me a message via mychart in one week with macros-protein, carbs, fat and calories.   Behavioral Intervention  We discussed the following Behavioral Modification Strategies today: increasing lean protein intake to established goals, decreasing simple carbohydrates , increasing vegetables, increasing water intake , work on meal planning and preparation, work on tracking and journaling calories using tracking application, reading food labels , keeping healthy foods at home, continue to work on implementation of reduced calorie nutritional plan, continue to practice mindfulness when eating, planning for success, and continue to work on maintaining a reduced calorie state, getting the recommended amount of protein, incorporating whole foods, making healthy choices, staying well hydrated and practicing mindfulness when eating..  Additional resources provided today: NA  Recommended Physical  Activity Goals  Amber Stephens has been advised to work up to 150 minutes of moderate intensity aerobic activity a week and strengthening exercises 2-3 times per week for cardiovascular health, weight loss maintenance and preservation of muscle mass.   She has agreed to Think about enjoyable ways to increase daily physical activity and overcoming barriers to exercise, Increase physical activity in their day and reduce sedentary time (increase NEAT)., Increase the intensity, frequency or duration of strengthening exercises , and Increase the intensity, frequency or duration of aerobic exercises     Pharmacotherapy We discussed various medication options to help Dimple with her weight loss efforts and we both agreed to stop Wellbutrin and start Lomaira 8mg  BID.  Side effects discussed.  ASSOCIATED CONDITIONS ADDRESSED TODAY  Action/Plan  Prediabetes Will continue to monitor  Katey will continue to work on weight loss, exercise, and decreasing simple carbohydrates to help decrease the risk of diabetes.    Vitamin D deficiency Start Vit D OTC  Elevated creatine kinase Offered to recheck labs today.  Patient would like to wait to recheck labs until next visit.  Abnormal craving Stop Wellbutrin.  Will see how she does with Lomaira 8mg  BID.    Polyphagia Lasandra Beech; Take 1 tablet (8 mg total) by mouth 2 (two) times daily.  Dispense: 60 tablet; Refill: 0  Class 1 obesity due to excess calories without serious comorbidity with body mass index (BMI) of 33.0 to 33.9 in adult -     Lomaira; Take 1 tablet (8 mg total) by mouth 2 (two) times daily.  Dispense: 60 tablet; Refill: 0     Options discussed today Continue Lomaira 8mg  once daily and Wellbutin SR 150mg  Take Lomaira 8mg  BID (twice per day) and Wellbutin SR 150mg -monitor for worsening of tremors Take Lomaira 8mg  BID (twice per day) and decrease Wellbutrin SR 100mg  if notices an increase in tremors Take Lomaira 8mg  BID and change to  Wellbutrin XL (extended release) 150mg  daily.  Take Lomaira 8mg  BID and stop Wellbutrin-to send me a message via mychart to let me know how she is doing.   Will check BMP at next visit.   Return in about 4 weeks (around 05/11/2023).Marland Kitchen She was informed of the importance of frequent follow up visits to maximize her success with intensive lifestyle modifications for her multiple health conditions.   ATTESTASTION STATEMENTS:  Reviewed by clinician on day of visit: allergies, medications, problem list, medical history, surgical history, family history, social history, and previous encounter notes.   Time spent on visit including pre-visit chart review and post-visit care and charting was 30 minutes.    Theodis Sato. Josephmichael Lisenbee FNP-C

## 2023-04-16 ENCOUNTER — Other Ambulatory Visit: Payer: Self-pay | Admitting: Nurse Practitioner

## 2023-04-16 DIAGNOSIS — R638 Other symptoms and signs concerning food and fluid intake: Secondary | ICD-10-CM

## 2023-04-21 ENCOUNTER — Encounter: Payer: Self-pay | Admitting: Nurse Practitioner

## 2023-05-04 ENCOUNTER — Encounter: Payer: Self-pay | Admitting: Nurse Practitioner

## 2023-05-04 ENCOUNTER — Ambulatory Visit: Admitting: Nurse Practitioner

## 2023-05-04 VITALS — BP 112/76 | HR 57 | Temp 98.0°F | Ht 62.0 in | Wt 180.0 lb

## 2023-05-04 DIAGNOSIS — R748 Abnormal levels of other serum enzymes: Secondary | ICD-10-CM

## 2023-05-04 DIAGNOSIS — R632 Polyphagia: Secondary | ICD-10-CM | POA: Diagnosis not present

## 2023-05-04 DIAGNOSIS — R7303 Prediabetes: Secondary | ICD-10-CM

## 2023-05-04 DIAGNOSIS — E66811 Obesity, class 1: Secondary | ICD-10-CM

## 2023-05-04 DIAGNOSIS — I10 Essential (primary) hypertension: Secondary | ICD-10-CM | POA: Diagnosis not present

## 2023-05-04 DIAGNOSIS — Z6833 Body mass index (BMI) 33.0-33.9, adult: Secondary | ICD-10-CM

## 2023-05-04 MED ORDER — LOMAIRA 8 MG PO TABS: 1.0000 | ORAL_TABLET | Freq: Two times a day (BID) | ORAL | 0 refills | Status: DC

## 2023-05-04 NOTE — Progress Notes (Signed)
 Office: [phone]  /  Fax: [phone]  WEIGHT SUMMARY AND BIOMETRICS  Weight Lost Since Last Visit: 5lb  Weight Gained Since Last Visit: 0lb   Vitals Temp: 98 F (36.7 C) BP: 112/76 Pulse Rate: (!) 57 SpO2: 98 %   Anthropometric Measurements Height: 5\' 2"  (1.575 m) Weight: 180 lb (81.6 kg) BMI (Calculated): 32.91 Weight at Last Visit: 185lb Weight Lost Since Last Visit: 5lb Weight Gained Since Last Visit: 0lb Starting Weight: 205lb Total Weight Loss (lbs): 25 lb (11.3 kg)   Body Composition  Body Fat %: 34.8 % Fat Mass (lbs): 63 lbs Muscle Mass (lbs): 111.8 lbs Total Body Water (lbs): 73.8 lbs Visceral Fat Rating : 8   Other Clinical Data Fasting: Yes Labs: No Today's Visit #: 90 Starting Date: 03/26/22     HPI  Chief Complaint: OBESITY  Linnae is here to discuss her progress with her obesity treatment plan. She is on the the Category 2 Plan and states she is following her eating plan approximately 85-90 % of the time. She states she is exercising 30 minutes 5 days per week.   Interval History:  Since last office visit she has lost 5 pounds.  She has been avoiding sweets. She is tracking and averaging around 1150-1450 calories, 80 grams of protein, 86 carbs and 28 fats.  She is exercising 5 days per week.  She is drinking a protein shake daily since I've seen her. She is also drinking hot tea and water   She is going on a cruise on May 24th.     Pharmacotherapy for weight loss: She is currently taking taking Lomaira 8mg  BID (10 am & 4-5 pm).  Denies side effects. Has helped with polyphagia and cravings.  Doing well without Wellbutrin at this time.  Stopped after last visit due to tremors while taking with Lomaira.     Previous pharmacotherapy for medical weight loss:  Lomaira   Bariatric surgery:  Patient has not had bariatric surgery.   Elevated creat Last creat level was 1.13.  Took Benadryl and cough meds over the past 3-4 days.  Denies  alcohol intake.  Will recheck labs today.     Prediabetes Last A1c was 5.8  Medication(s): None Polyphagia:Yes Lab Results  Component Value Date   HGBA1C 5.8 (H) 03/16/2023   HGBA1C 5.6 03/03/2022   HGBA1C 5.5 08/05/2021   HGBA1C 5.7 (H) 03/26/2021   Lab Results  Component Value Date   INSULIN 8.3 03/16/2023   INSULIN 8.4 03/03/2022   INSULIN 11.3 08/05/2021   INSULIN 19.1 03/26/2021    Hypertension Hypertension stable.  Medication(s): Norvasc 5mg . Denies side effects. Goal is to stop Norvasc as she continues to lose weight.   Denies chest pain, palpitations and SOB.  BP Readings from Last 3 Encounters:  05/04/23 112/76  04/13/23 116/80  03/16/23 121/83   Lab Results  Component Value Date   CREATININE 1.13 (H) 03/16/2023   CREATININE 1.01 (H) 01/05/2023   CREATININE 0.92 08/17/2022     PHYSICAL EXAM:  Blood pressure 112/76, pulse (!) 57, temperature 98 F (36.7 C), height 5\' 2"  (1.575 m), weight 180 lb (81.6 kg), last menstrual period 06/04/2017, SpO2 98%. Body mass index is 32.92 kg/m.  General: She is overweight, cooperative, alert, well developed, and in no acute distress. PSYCH: Has normal mood, affect and thought process.   Extremities: No edema.  Neurologic: No gross sensory or motor deficits. No tremors or fasciculations noted.    DIAGNOSTIC DATA REVIEWED:  BMET    Component Value Date/Time   NA 139 03/16/2023 1050   K 4.6 03/16/2023 1050   CL 101 03/16/2023 1050   CO2 23 03/16/2023 1050   GLUCOSE 91 03/16/2023 1050   GLUCOSE 89 05/28/2020 0831   BUN 16 03/16/2023 1050   CREATININE 1.13 (H) 03/16/2023 1050   CALCIUM 9.4 03/16/2023 1050   GFRNONAA >60 06/07/2017 0915   GFRAA >60 06/07/2017 0915   Lab Results  Component Value Date   HGBA1C 5.8 (H) 03/16/2023   HGBA1C 5.7 (H) 03/26/2021   Lab Results  Component Value Date   INSULIN 8.3 03/16/2023   INSULIN 19.1 03/26/2021   Lab Results  Component Value Date   TSH 0.741 03/16/2023    CBC    Component Value Date/Time   WBC 5.8 03/16/2023 1050   WBC 5.1 04/03/2019 1005   RBC 4.64 03/16/2023 1050   RBC 4.36 04/03/2019 1005   HGB 13.9 03/16/2023 1050   HCT 41.7 03/16/2023 1050   PLT 366 03/16/2023 1050   MCV 90 03/16/2023 1050   MCH 30.0 03/16/2023 1050   MCH 28.8 06/17/2017 0443   MCHC 33.3 03/16/2023 1050   MCHC 33.6 04/03/2019 1005   RDW 12.4 03/16/2023 1050   Iron Studies    Component Value Date/Time   IRON 106 10/30/2012 1137   IRONPCTSAT 28.6 10/30/2012 1137   Lipid Panel     Component Value Date/Time   CHOL 160 03/16/2023 1050   TRIG 69 03/16/2023 1050   HDL 53 03/16/2023 1050   CHOLHDL 3 05/28/2020 0831   VLDL 11.4 05/28/2020 0831   LDLCALC 94 03/16/2023 1050   Hepatic Function Panel     Component Value Date/Time   PROT 7.2 03/16/2023 1050   ALBUMIN 4.4 03/16/2023 1050   AST 18 03/16/2023 1050   ALT 15 03/16/2023 1050   ALKPHOS 56 03/16/2023 1050   BILITOT 0.4 03/16/2023 1050   BILIDIR 0.1 04/03/2019 1005      Component Value Date/Time   TSH 0.741 03/16/2023 1050   Nutritional Lab Results  Component Value Date   VD25OH 39.0 03/16/2023   VD25OH 83.1 03/03/2022   VD25OH 72.4 08/05/2021     ASSESSMENT AND PLAN  TREATMENT PLAN FOR OBESITY:  Recommended Dietary Goals  Mega is currently in the action stage of change. As such, her goal is to continue weight management plan. She has agreed to the Category 2 Plan.  Behavioral Intervention  We discussed the following Behavioral Modification Strategies today: increasing lean protein intake to established goals, decreasing simple carbohydrates , increasing vegetables, increasing fiber rich foods, increasing water intake , reading food labels , keeping healthy foods at home, and continue to work on maintaining a reduced calorie state, getting the recommended amount of protein, incorporating whole foods, making healthy choices, staying well hydrated and practicing mindfulness when  eating..  Additional resources provided today: NA  Recommended Physical Activity Goals  Levy has been advised to work up to 150 minutes of moderate intensity aerobic activity a week and strengthening exercises 2-3 times per week for cardiovascular health, weight loss maintenance and preservation of muscle mass.   She has agreed to Think about enjoyable ways to increase daily physical activity and overcoming barriers to exercise, Increase physical activity in their day and reduce sedentary time (increase NEAT)., Increase the intensity, frequency or duration of strengthening exercises , and Increase the intensity, frequency or duration of aerobic exercises     Pharmacotherapy We discussed various medication options  to help Zeriah with her weight loss efforts and we both agreed to continue Lomaira 8mg  BID.  Side effects discussed.  ASSOCIATED CONDITIONS ADDRESSED TODAY  Action/Plan  Elevated creatine kinase -     Basic metabolic panel with GFR  Prediabetes Jaala will continue to work on weight loss, exercise, and decreasing simple carbohydrates to help decrease the risk of diabetes.    Primary hypertension Continue to follow up with PCP.  Continue meds as directed.    Polyphagia Lasandra Beech; Take 1 tablet (8 mg total) by mouth 2 (two) times daily.  Dispense: 60 tablet; Refill: 0  Class 1 obesity due to excess calories without serious comorbidity with body mass index (BMI) of 33.0 to 33.9 in adult -     Lomaira; Take 1 tablet (8 mg total) by mouth 2 (two) times daily.  Dispense: 60 tablet; Refill: 0         Return in about 4 weeks (around 06/01/2023).Marland Kitchen She was informed of the importance of frequent follow up visits to maximize her success with intensive lifestyle modifications for her multiple health conditions.   ATTESTASTION STATEMENTS:  Reviewed by clinician on day of visit: allergies, medications, problem list, medical history, surgical history, family history,  social history, and previous encounter notes.     Theodis Sato. Juanya Villavicencio FNP-C

## 2023-05-05 ENCOUNTER — Encounter: Payer: Self-pay | Admitting: Nurse Practitioner

## 2023-05-05 LAB — BASIC METABOLIC PANEL WITH GFR
BUN/Creatinine Ratio: 13 (ref 9–23)
BUN: 13 mg/dL (ref 6–24)
CO2: 25 mmol/L (ref 20–29)
Calcium: 9.5 mg/dL (ref 8.7–10.2)
Chloride: 102 mmol/L (ref 96–106)
Creatinine, Ser: 1 mg/dL (ref 0.57–1.00)
Glucose: 82 mg/dL (ref 70–99)
Potassium: 4.7 mmol/L (ref 3.5–5.2)
Sodium: 139 mmol/L (ref 134–144)
eGFR: 69 mL/min/{1.73_m2} (ref >59)

## 2023-05-06 ENCOUNTER — Other Ambulatory Visit: Payer: Self-pay | Admitting: Family

## 2023-05-23 ENCOUNTER — Other Ambulatory Visit: Payer: Self-pay | Admitting: Obstetrics and Gynecology

## 2023-05-23 DIAGNOSIS — R928 Other abnormal and inconclusive findings on diagnostic imaging of breast: Secondary | ICD-10-CM

## 2023-05-25 ENCOUNTER — Encounter: Payer: Self-pay | Admitting: Nurse Practitioner

## 2023-05-25 ENCOUNTER — Ambulatory Visit: Admitting: Nurse Practitioner

## 2023-05-25 VITALS — BP 127/82 | HR 61 | Temp 98.2°F | Ht 62.0 in | Wt 177.0 lb

## 2023-05-25 DIAGNOSIS — I10 Essential (primary) hypertension: Secondary | ICD-10-CM

## 2023-05-25 DIAGNOSIS — R748 Abnormal levels of other serum enzymes: Secondary | ICD-10-CM

## 2023-05-25 DIAGNOSIS — E66811 Obesity, class 1: Secondary | ICD-10-CM | POA: Diagnosis not present

## 2023-05-25 DIAGNOSIS — Z6832 Body mass index (BMI) 32.0-32.9, adult: Secondary | ICD-10-CM

## 2023-05-25 DIAGNOSIS — R632 Polyphagia: Secondary | ICD-10-CM | POA: Diagnosis not present

## 2023-05-25 MED ORDER — LOMAIRA 8 MG PO TABS: 1.0000 | ORAL_TABLET | Freq: Two times a day (BID) | ORAL | 0 refills | Status: DC

## 2023-05-25 NOTE — Progress Notes (Signed)
 Office: [phone]  /  Fax: [phone]  WEIGHT SUMMARY AND BIOMETRICS  Weight Lost Since Last Visit: 3lb  Weight Gained Since Last Visit: 0lb   Vitals Temp: 98.2 F (36.8 C) BP: 127/82 Pulse Rate: 61 SpO2: 97 %   Anthropometric Measurements Height: 5\' 2"  (1.575 m) Weight: 177 lb (80.3 kg) BMI (Calculated): 32.37 Weight at Last Visit: 180lb Weight Lost Since Last Visit: 3lb Weight Gained Since Last Visit: 0lb Starting Weight: 205lb Total Weight Loss (lbs): 28 lb (12.7 kg)   Body Composition  Body Fat %: 33.6 % Fat Mass (lbs): 59.6 lbs Muscle Mass (lbs): 111.8 lbs Total Body Water (lbs): 74.8 lbs Visceral Fat Rating : 8   Other Clinical Data Fasting: Yes Labs: No Today's Visit #: 51 Starting Date: 03/26/21     HPI  Chief Complaint: OBESITY  Amber Stephens is here to discuss her progress with her obesity treatment plan. She is on the the Category 2 Plan and states she is following her eating plan approximately 95 % of the time. She states she is exercising 60 minutes 5 days per week.   Interval History:  Since last office visit she has lost 3 pounds. She has been making healthier choices and watching her portion sizes. She is aiming to eat more protein.  She is averaging around 1300-1400 calories and is unsure of protein.  She has been "working out harder".  She is riding her bike and walking on her treadmill for 1 hour 5 days per week.    Goal weight:  165 lbs  She is going on a cruise on May 24th.     Pharmacotherapy for weight loss: She is currently taking taking Lomaira  8mg  BID (10 am & 4-5 pm).  Denies side effects. Has helped with polyphagia and cravings.    Previous pharmacotherapy for medical weight loss:  Lomaira  & Wellbutrin  (stopped due to tremors)   Bariatric surgery:  Patient has not had bariatric surgery.    Elevated creat Last BMP on 05/04/23 was 1.00-Improved  Hypertension Hypertension stable. BP at home 107/75-135/70s Medication(s):  Norvasc  5mg . Denies side effects. Her goal is to stop Norvasc  as she continues to lose weight.  She will skip doses occasionally based upon her BP at home.    Denies chest pain, palpitations and SOB.  BP Readings from Last 3 Encounters:  05/25/23 127/82  05/04/23 112/76  04/13/23 116/80   Lab Results  Component Value Date   CREATININE 1.00 05/04/2023   CREATININE 1.13 (H) 03/16/2023   CREATININE 1.01 (H) 01/05/2023    PHYSICAL EXAM:  Blood pressure 127/82, pulse 61, temperature 98.2 F (36.8 C), height 5\' 2"  (1.575 m), weight 177 lb (80.3 kg), last menstrual period 06/04/2017, SpO2 97%. Body mass index is 32.37 kg/m.  General: She is overweight, cooperative, alert, well developed, and in no acute distress. PSYCH: Has normal mood, affect and thought process.   Extremities: No edema.  Neurologic: No gross sensory or motor deficits. No tremors or fasciculations noted.    DIAGNOSTIC DATA REVIEWED:  BMET    Component Value Date/Time   NA 139 05/04/2023 1218   K 4.7 05/04/2023 1218   CL 102 05/04/2023 1218   CO2 25 05/04/2023 1218   GLUCOSE 82 05/04/2023 1218   GLUCOSE 89 05/28/2020 0831   BUN 13 05/04/2023 1218   CREATININE 1.00 05/04/2023 1218   CALCIUM 9.5 05/04/2023 1218   GFRNONAA >60 06/07/2017 0915   GFRAA >60 06/07/2017 0915   Lab Results  Component  Value Date   HGBA1C 5.8 (H) 03/16/2023   HGBA1C 5.7 (H) 03/26/2021   Lab Results  Component Value Date   INSULIN  8.3 03/16/2023   INSULIN  19.1 03/26/2021   Lab Results  Component Value Date   TSH 0.741 03/16/2023   CBC    Component Value Date/Time   WBC 5.8 03/16/2023 1050   WBC 5.1 04/03/2019 1005   RBC 4.64 03/16/2023 1050   RBC 4.36 04/03/2019 1005   HGB 13.9 03/16/2023 1050   HCT 41.7 03/16/2023 1050   PLT 366 03/16/2023 1050   MCV 90 03/16/2023 1050   MCH 30.0 03/16/2023 1050   MCH 28.8 06/17/2017 0443   MCHC 33.3 03/16/2023 1050   MCHC 33.6 04/03/2019 1005   RDW 12.4 03/16/2023 1050   Iron  Studies    Component Value Date/Time   IRON 106 10/30/2012 1137   IRONPCTSAT 28.6 10/30/2012 1137   Lipid Panel     Component Value Date/Time   CHOL 160 03/16/2023 1050   TRIG 69 03/16/2023 1050   HDL 53 03/16/2023 1050   CHOLHDL 3 05/28/2020 0831   VLDL 11.4 05/28/2020 0831   LDLCALC 94 03/16/2023 1050   Hepatic Function Panel     Component Value Date/Time   PROT 7.2 03/16/2023 1050   ALBUMIN 4.4 03/16/2023 1050   AST 18 03/16/2023 1050   ALT 15 03/16/2023 1050   ALKPHOS 56 03/16/2023 1050   BILITOT 0.4 03/16/2023 1050   BILIDIR 0.1 04/03/2019 1005      Component Value Date/Time   TSH 0.741 03/16/2023 1050   Nutritional Lab Results  Component Value Date   VD25OH 39.0 03/16/2023   VD25OH 83.1 03/03/2022   VD25OH 72.4 08/05/2021     ASSESSMENT AND PLAN  TREATMENT PLAN FOR OBESITY:  Recommended Dietary Goals  Amber Stephens is currently in the action stage of change. As such, her goal is to continue weight management plan. She has agreed to keeping a food journal and adhering to recommended goals of 1300-1400 calories and 100+ grams protein.  Behavioral Intervention  We discussed the following Behavioral Modification Strategies today: increasing lean protein intake to established goals, decreasing simple carbohydrates , increasing vegetables, increasing fiber rich foods, increasing water intake , reading food labels , keeping healthy foods at home, and continue to work on maintaining a reduced calorie state, getting the recommended amount of protein, incorporating whole foods, making healthy choices, staying well hydrated and practicing mindfulness when eating..  Additional resources provided today: NA  Recommended Physical Activity Goals  Amber Stephens has been advised to work up to 150 minutes of moderate intensity aerobic activity a week and strengthening exercises 2-3 times per week for cardiovascular health, weight loss maintenance and preservation of muscle mass.    She has agreed to Continue current level of physical activity    Pharmacotherapy We discussed various medication options to help Amber Stephens with her weight loss efforts and we both agreed to continue Lomaira  8mg  BID.  Side effects discussed.  ASSOCIATED CONDITIONS ADDRESSED TODAY  Action/Plan  Elevated creatine kinase Improved-will continue to monitor-last BMP reviewed with patient.   Primary hypertension Will take 1/2 tablet Norvasc .  To continue to monitor BP at home.  If BP increases, will increase Norvasc  back to 5mg .    Polyphagia -     Lomaira ; Take 1 tablet (8 mg total) by mouth 2 (two) times daily.  Dispense: 60 tablet; Refill: 0.  Side effects discussed.   Class 1 obesity due to excess calories without  serious comorbidity with body mass index (BMI) of 32.0 to 32.9 in adult -     Lomaira ; Take 1 tablet (8 mg total) by mouth 2 (two) times daily.  Dispense: 60 tablet; Refill: 0         Return in about 4 weeks (around 06/22/2023).Amber Stephens She was informed of the importance of frequent follow up visits to maximize her success with intensive lifestyle modifications for her multiple health conditions.   ATTESTASTION STATEMENTS:  Reviewed by clinician on day of visit: allergies, medications, problem list, medical history, surgical history, family history, social history, and previous encounter notes.    Amber Stephens. Maverik Foot FNP-C

## 2023-06-02 ENCOUNTER — Other Ambulatory Visit: Payer: Self-pay | Admitting: Obstetrics and Gynecology

## 2023-06-02 ENCOUNTER — Ambulatory Visit
Admission: RE | Admit: 2023-06-02 | Discharge: 2023-06-02 | Disposition: A | Discharge status: Home / Self Care | Source: Ambulatory Visit | Attending: Obstetrics and Gynecology | Admitting: Obstetrics and Gynecology

## 2023-06-02 DIAGNOSIS — R928 Other abnormal and inconclusive findings on diagnostic imaging of breast: Secondary | ICD-10-CM

## 2023-06-02 DIAGNOSIS — N6320 Unspecified lump in the left breast, unspecified quadrant: Secondary | ICD-10-CM

## 2023-06-06 ENCOUNTER — Ambulatory Visit
Admission: RE | Admit: 2023-06-06 | Discharge: 2023-06-06 | Disposition: A | Discharge status: Home / Self Care | Source: Ambulatory Visit | Attending: Obstetrics and Gynecology | Admitting: Obstetrics and Gynecology

## 2023-06-06 DIAGNOSIS — N6320 Unspecified lump in the left breast, unspecified quadrant: Secondary | ICD-10-CM

## 2023-06-06 HISTORY — PX: BREAST BIOPSY: SHX20

## 2023-06-07 ENCOUNTER — Telehealth: Payer: Self-pay | Admitting: *Deleted

## 2023-06-07 ENCOUNTER — Encounter: Payer: Self-pay | Admitting: *Deleted

## 2023-06-07 LAB — SURGICAL PATHOLOGY

## 2023-06-07 NOTE — Telephone Encounter (Signed)
 Confirmed [facility] for 06/15/23 at 8:30am .  Instructions and contact information given.

## 2023-06-13 ENCOUNTER — Encounter: Payer: Self-pay | Admitting: *Deleted

## 2023-06-13 DIAGNOSIS — C50412 Malignant neoplasm of upper-outer quadrant of left female breast: Secondary | ICD-10-CM | POA: Insufficient documentation

## 2023-06-14 NOTE — Progress Notes (Signed)
 Radiation Oncology         [phone] ________________________________  Initial Outpatient Consultation  Name: Amber Stephens MRN: [id number]  Date: 06/15/2023  DOB: Apr 02, 1975  CC:O'Sullivan, Lovett Ruck, NP  Lockie Rima, MD   REFERRING PHYSICIAN: Lockie Rima, MD  DIAGNOSIS: No diagnosis found.   Cancer Staging  No matching staging information was found for the patient.   Stage *** Left Breast UOQ, Invasive and in situ ductal carcinoma, ER+ / PR+ / Her2-, Grade 2  CHIEF COMPLAINT: Here to discuss management of left breast cancer  HISTORY OF PRESENT ILLNESS::Amber Melven Stable Stephens is a 48 y.o. female who presented with a possible left breast abnormality on the following imaging: bilateral screening mammogram on the date of 05/18/23. No symptoms, if any, were reported at that time. A left breast diagnostic mammogram and left breast ultrasound was subsequently performed which revealed: an irregular 1.5 cm mass in the 12 o'clock left breast located 5 cmfn, suspicious for malignancy. No evidence of left axillary lymphadenopathy was demonstrated.   Biopsy of the 12 o'clock left breast (5 cmfn) on date of 06/06/23 showed grade 2 invasive mammary/ductal carcinoma measuring 7 mm in the greatest linear extent of the sample with intermediate grade DCIS; positive for LVI.  ER status: 100% positive and PR status 100% positive, both with strong staining intensity; Proliferation marker Ki67 at 1%; Her2 status negative; Grade 2. No lymph nodes were examined.   ***  PREVIOUS RADIATION THERAPY: No  PAST MEDICAL HISTORY:  has a past medical history of Allergic rhinitis, Anemia, Asthma, Fatigue, History of pneumonia (age 26 and agin), Hypertension (05/28/2020), Multiple food allergies, Shortness of breath, and Shortness of breath on exertion.    PAST SURGICAL HISTORY: Past Surgical History:  Procedure Laterality Date   ABDOMINAL HYSTERECTOMY N/A 06/16/2017   Procedure: HYSTERECTOMY ABDOMINAL  TOTAL;  Surgeon: Leontine Rana, MD;  Location: [facility];  Service: Gynecology;  Laterality: N/A;   BILATERAL SALPINGECTOMY Bilateral 06/16/2017   Procedure: BILATERAL SALPINGECTOMY;  Surgeon: Leontine Rana, MD;  Location: [facility];  Service: Gynecology;  Laterality: Bilateral;   BREAST BIOPSY Left 06/06/2023   US  LT BREAST BX W LOC DEV 1ST LESION IMG BX SPEC US  GUIDE 06/06/2023 [facility] M[facility]MO[facility]   BTL  11/2008   Dr Asencion Blacksmith; bilateral tubal ligation    CESAREAN SECTION     x4 , with each child , has 4 children    DILATION AND CURETTAGE OF UTERUS  07/02/2012   MYOMECTOMY  2001   [facility] Sabino Crafts [facility]     FAMILY HISTORY: family history includes AAA (abdominal aortic aneurysm) in her maternal grandfather; Alzheimer's disease in her mother; Breast cancer in her mother; Colon cancer (age of onset: 105) in her maternal grandfather; Diabetes in her maternal grandfather and maternal grandmother; Hypertension in her father; Stroke in her maternal grandmother and mother.  SOCIAL HISTORY:  reports that she has never smoked. She has never used smokeless tobacco. She reports that she does not drink alcohol and does not use drugs.  ALLERGIES: Grass pollen([id number]-swt vern), Phenol, and Other  MEDICATIONS:  Current Outpatient Medications  Medication Sig Dispense Refill   albuterol  (PROAIR  HFA) 108 (90 Base) MCG/ACT inhaler USE 1-2 PUFFS BY MOUTH EVERY 4 HOURS AS NEEDED wheezing and shortness of breath 3 each 0   amLODipine  (NORVASC ) 5 MG tablet TAKE 1 TABLET BY MOUTH EVERY DAY 90 tablet 1   estradiol (VIVELLE-DOT) 0.1 MG/24HR patch Place 1 patch onto  the skin 2 (two) times a week.     Phentermine  HCl (LOMAIRA ) 8 MG TABS Take 1 tablet (8 mg total) by mouth 2 (two) times daily. 60 tablet 0   No current facility-administered medications for this encounter.    REVIEW OF SYSTEMS: As above in HPI.   PHYSICAL EXAM:  vitals were not taken for this visit.   General:  Alert and oriented, in no acute distress HEENT: Head is normocephalic. Extraocular movements are intact.  Heart: Regular in rate and rhythm with no murmurs, rubs, or gallops. Chest: Clear to auscultation bilaterally, with no rhonchi, wheezes, or rales. Abdomen: Soft, nontender, nondistended, with no rigidity or guarding. Extremities: No cyanosis or edema. Skin: No concerning lesions. Musculoskeletal: symmetric strength and muscle tone throughout. Neurologic: Cranial nerves II through XII are grossly intact. No obvious focalities. Speech is fluent. Coordination is intact. Psychiatric: Judgment and insight are intact. Affect is appropriate. Breasts: *** . No other palpable masses appreciated in the breasts or axillae *** .    ECOG = ***  0 - Asymptomatic (Fully active, able to carry on all predisease activities without restriction)  1 - Symptomatic but completely ambulatory (Restricted in physically strenuous activity but ambulatory and able to carry out work of a light or sedentary nature. For example, light housework, office work)  2 - Symptomatic, <50% in bed during the day (Ambulatory and capable of all self care but unable to carry out any work activities. Up and about more than 50% of waking hours)  3 - Symptomatic, >50% in bed, but not bedbound (Capable of only limited self-care, confined to bed or chair 50% or more of waking hours)  4 - Bedbound (Completely disabled. Cannot carry on any self-care. Totally confined to bed or chair)  5 - Death   Aurea Blossom MM, Creech RH, Tormey DC, et al. [phone]). "Toxicity and response criteria of the [facility]". Am. Hillard Lowes. Oncol. 5 (6): 649-55   LABORATORY DATA:   CBC    Component Value Date/Time   WBC 5.8 03/16/2023 1050   WBC 5.1 04/03/2019 1005   RBC 4.64 03/16/2023 1050   RBC 4.36 04/03/2019 1005   HGB 13.9 03/16/2023 1050   HCT 41.7 03/16/2023 1050   PLT 366 03/16/2023 1050   MCV 90 03/16/2023 1050   MCH 30.0  03/16/2023 1050   MCH 28.8 06/17/2017 0443   MCHC 33.3 03/16/2023 1050   MCHC 33.6 04/03/2019 1005   RDW 12.4 03/16/2023 1050   LYMPHSABS 2.2 03/16/2023 1050   MONOABS 0.5 04/03/2019 1005   EOSABS 0.2 03/16/2023 1050   BASOSABS 0.1 03/16/2023 1050    CMP     Component Value Date/Time   NA 139 05/04/2023 1218   K 4.7 05/04/2023 1218   CL 102 05/04/2023 1218   CO2 25 05/04/2023 1218   GLUCOSE 82 05/04/2023 1218   GLUCOSE 89 05/28/2020 0831   BUN 13 05/04/2023 1218   CREATININE 1.00 05/04/2023 1218   CALCIUM 9.5 05/04/2023 1218   PROT 7.2 03/16/2023 1050   ALBUMIN 4.4 03/16/2023 1050   AST 18 03/16/2023 1050   ALT 15 03/16/2023 1050   ALKPHOS 56 03/16/2023 1050   BILITOT 0.4 03/16/2023 1050   GFR 74.08 05/28/2020 0831   EGFR 69 05/04/2023 1218   GFRNONAA >60 06/07/2017 0915      RADIOGRAPHY: US  LT BREAST BX W LOC DEV 1ST LESION IMG BX SPEC US  GUIDE Addendum Date: 06/07/2023 ADDENDUM REPORT: 06/07/2023 12:39 ADDENDUM: Pathology revealed  GRADE II INVASIVE MAMMARY CARCINOMA, MAMMARY CARCINOMA IN SITU, SOLID, INTERMEDIATE NUCLEAR GRADE, CALCIFICATIONS: NOT IDENTIFIED of the LEFT breast, 12 o'clock, 5cmfn, (coil clip). This was found to be concordant by Dr. Amanda Jungling. Pathology results were discussed with the patient by telephone. The patient reported doing well after the biopsy with tenderness and slight bleeding at the site. Post biopsy instructions and care were reviewed and questions were answered. The patient was encouraged to call [facility] for any additional concerns. My direct phone number was provided. The patient was referred to [facility] on Jun 15, 2023. Pathology results reported by Kraig Peru, RN on 06/07/2023. Electronically Signed   By: Amanda Jungling M.D.   On: 06/07/2023 12:39   Result Date: 06/07/2023 CLINICAL DATA:  Patient presents for ultrasound-guided core  needle biopsy of a left breast mass. EXAM: ULTRASOUND GUIDED LEFT BREAST CORE NEEDLE BIOPSY COMPARISON:  Previous exam(s). PROCEDURE: I met with the patient and we discussed the procedure of ultrasound-guided biopsy, including benefits and alternatives. We discussed the high likelihood of a successful procedure. We discussed the risks of the procedure, including infection, bleeding, tissue injury, clip migration, and inadequate sampling. Informed written consent was given. The usual time-out protocol was performed immediately prior to the procedure. Lesion quadrant: Upper outer quadrant: Near 12 o'clock, 5 cm from the nipple. Using sterile technique and 1% Lidocaine  as local anesthetic, under direct ultrasound visualization, a 12 gauge spring-loaded device was used to perform biopsy of the ill-defined, hypoechoic shadowing mass at 12 o'clock using a lateral approach. At the conclusion of the procedure a coil shaped tissue marker clip was deployed into the biopsy cavity. Follow up 2 view mammogram was performed and dictated separately. IMPRESSION: Ultrasound guided biopsy of a left breast mass. No apparent complications. Electronically Signed: By: Amanda Jungling M.D. On: 06/06/2023 08:10   MM CLIP PLACEMENT LEFT Result Date: 06/06/2023 CLINICAL DATA:  Status post ultrasound-guided core needle biopsy of a left breast mass. EXAM: 3D DIAGNOSTIC LEFT MAMMOGRAM POST ULTRASOUND BIOPSY COMPARISON:  Previous exam(s). ACR Breast Density Category b: There are scattered areas of fibroglandular density. FINDINGS: 3D Mammographic images were obtained following ultrasound guided biopsy of a left breast mass. The biopsy marking clip is in expected position at the site of biopsy. IMPRESSION: Appropriate positioning of the coil shaped biopsy marking clip at the site of biopsy in the within the mass in the slightly upper, central left breast. Final Assessment: Post Procedure Mammograms for Marker Placement Electronically Signed   By:  Amanda Jungling M.D.   On: 06/06/2023 08:25   MM 3D DIAGNOSTIC MAMMOGRAM UNILATERAL LEFT BREAST Result Date: 06/02/2023 CLINICAL DATA:  48 year old female recalled for possible focal asymmetry and distortion in the left breast seen on recent screening mammogram. EXAM: DIGITAL DIAGNOSTIC UNILATERAL LEFT MAMMOGRAM WITH TOMOSYNTHESIS AND CAD; ULTRASOUND LEFT BREAST LIMITED TECHNIQUE: Left digital diagnostic mammography and breast tomosynthesis was performed. The images were evaluated with computer-aided detection. ; Targeted ultrasound examination of the left breast was performed. COMPARISON:  Previous exam(s). ACR Breast Density Category b: There are scattered areas of fibroglandular density. FINDINGS: Additional views performed today including spot compression tomosynthesis views demonstrate a 1.3 cm irregular mass with associated architectural distortion in the left breast upper inner aspect middle depth approximately 6 cm from the nipple. Ultrasound of the left breast is recommended for further evaluation. Targeted ultrasound of the upper left breast demonstrates a hypoechoic 0.8  by 1.5 x 1 cm irregular mass with a hyperechoic rim in the 12 o'clock position 5 cm from the nipple. This mass demonstrates posterior acoustic shadowing. There is adjacent blood flow. Targeted ultrasound of the left axilla demonstrates 2 normal appearing lymph nodes. IMPRESSION: Irregular 1.5 cm mass in the left breast at 12 o'clock 5 cm from the nipple is suspicious for malignancy. RECOMMENDATION: Ultrasound-guided core biopsy of left breast mass is recommended. I have discussed the findings and recommendations with the patient. If applicable, a reminder letter will be sent to the patient regarding the next appointment. BI-RADS CATEGORY  5: Highly suggestive of malignancy. Electronically Signed   By: Dru Georges M.D.   On: 06/02/2023 16:33   US  LIMITED ULTRASOUND INCLUDING AXILLA LEFT BREAST  Result Date: 06/02/2023 CLINICAL DATA:   48 year old female recalled for possible focal asymmetry and distortion in the left breast seen on recent screening mammogram. EXAM: DIGITAL DIAGNOSTIC UNILATERAL LEFT MAMMOGRAM WITH TOMOSYNTHESIS AND CAD; ULTRASOUND LEFT BREAST LIMITED TECHNIQUE: Left digital diagnostic mammography and breast tomosynthesis was performed. The images were evaluated with computer-aided detection. ; Targeted ultrasound examination of the left breast was performed. COMPARISON:  Previous exam(s). ACR Breast Density Category b: There are scattered areas of fibroglandular density. FINDINGS: Additional views performed today including spot compression tomosynthesis views demonstrate a 1.3 cm irregular mass with associated architectural distortion in the left breast upper inner aspect middle depth approximately 6 cm from the nipple. Ultrasound of the left breast is recommended for further evaluation. Targeted ultrasound of the upper left breast demonstrates a hypoechoic 0.8 by 1.5 x 1 cm irregular mass with a hyperechoic rim in the 12 o'clock position 5 cm from the nipple. This mass demonstrates posterior acoustic shadowing. There is adjacent blood flow. Targeted ultrasound of the left axilla demonstrates 2 normal appearing lymph nodes. IMPRESSION: Irregular 1.5 cm mass in the left breast at 12 o'clock 5 cm from the nipple is suspicious for malignancy. RECOMMENDATION: Ultrasound-guided core biopsy of left breast mass is recommended. I have discussed the findings and recommendations with the patient. If applicable, a reminder letter will be sent to the patient regarding the next appointment. BI-RADS CATEGORY  5: Highly suggestive of malignancy. Electronically Signed   By: Dru Georges M.D.   On: 06/02/2023 16:33      IMPRESSION/PLAN: ***   It was a pleasure meeting the patient today. We discussed the risks, benefits, and side effects of radiotherapy. I recommend radiotherapy to the *** to reduce her risk of locoregional recurrence by  2/3.  We discussed that radiation would take approximately *** weeks to complete and that I would give the patient a few weeks to heal following surgery before starting treatment planning. *** If chemotherapy were to be given, this would precede radiotherapy. We spoke about acute effects including skin irritation and fatigue as well as much less common late effects including internal organ injury or irritation. We spoke about the latest technology that is used to minimize the risk of late effects for patients undergoing radiotherapy to the breast or chest wall. No guarantees of treatment were given. The patient is enthusiastic about proceeding with treatment. I look forward to participating in the patient's care.  I will await her referral back to me for postoperative follow-up and eventual CT simulation/treatment planning.  On date of service, in total, I spent *** minutes on this encounter. Patient was seen in person.   __________________________________________   Colie Dawes, MD  This document serves as a record  of services personally performed by Colie Dawes, MD. It was created on her behalf by Aleta Anda, a trained medical scribe. The creation of this record is based on the scribe's personal observations and the provider's statements to them. This document has been checked and approved by the attending provider.

## 2023-06-15 ENCOUNTER — Ambulatory Visit
Admission: RE | Admit: 2023-06-15 | Discharge: 2023-06-15 | Disposition: A | Discharge status: Home / Self Care | Source: Ambulatory Visit | Attending: Radiation Oncology | Admitting: Radiation Oncology

## 2023-06-15 ENCOUNTER — Ambulatory Visit: Attending: General Surgery | Admitting: Physical Therapy

## 2023-06-15 ENCOUNTER — Inpatient Hospital Stay: Attending: Licensed Clinical Social Worker | Admitting: Licensed Clinical Social Worker

## 2023-06-15 ENCOUNTER — Encounter: Payer: Self-pay | Admitting: *Deleted

## 2023-06-15 ENCOUNTER — Other Ambulatory Visit: Payer: Self-pay

## 2023-06-15 ENCOUNTER — Other Ambulatory Visit: Payer: Self-pay | Admitting: General Surgery

## 2023-06-15 ENCOUNTER — Inpatient Hospital Stay: Admitting: Hematology and Oncology

## 2023-06-15 ENCOUNTER — Encounter: Payer: Self-pay | Admitting: Genetic Counselor

## 2023-06-15 ENCOUNTER — Encounter: Payer: Self-pay | Admitting: Radiation Oncology

## 2023-06-15 ENCOUNTER — Inpatient Hospital Stay

## 2023-06-15 ENCOUNTER — Inpatient Hospital Stay (HOSPITAL_BASED_OUTPATIENT_CLINIC_OR_DEPARTMENT_OTHER): Admitting: Genetic Counselor

## 2023-06-15 ENCOUNTER — Encounter: Payer: Self-pay | Admitting: Physical Therapy

## 2023-06-15 VITALS — BP 148/85 | HR 68 | Temp 98.9°F | Resp 17 | Ht 62.0 in | Wt 180.9 lb

## 2023-06-15 DIAGNOSIS — C50412 Malignant neoplasm of upper-outer quadrant of left female breast: Secondary | ICD-10-CM | POA: Insufficient documentation

## 2023-06-15 DIAGNOSIS — Z170 Estrogen receptor positive status [ER+]: Secondary | ICD-10-CM | POA: Diagnosis present

## 2023-06-15 DIAGNOSIS — Z8051 Family history of malignant neoplasm of kidney: Secondary | ICD-10-CM

## 2023-06-15 DIAGNOSIS — Z803 Family history of malignant neoplasm of breast: Secondary | ICD-10-CM

## 2023-06-15 DIAGNOSIS — Z800 Family history of malignant neoplasm of digestive organs: Secondary | ICD-10-CM | POA: Diagnosis not present

## 2023-06-15 DIAGNOSIS — R293 Abnormal posture: Secondary | ICD-10-CM | POA: Diagnosis present

## 2023-06-15 LAB — CMP (CANCER CENTER ONLY)
ALT: 18 U/L (ref 0–44)
AST: 18 U/L (ref 15–41)
Albumin: 4.5 g/dL (ref 3.5–5.0)
Alkaline Phosphatase: 48 U/L (ref 38–126)
Anion gap: 5 (ref 5–15)
BUN: 20 mg/dL (ref 6–20)
CO2: 29 mmol/L (ref 22–32)
Calcium: 9.6 mg/dL (ref 8.9–10.3)
Chloride: 106 mmol/L (ref 98–111)
Creatinine: 0.94 mg/dL (ref 0.44–1.00)
GFR, Estimated: >60 mL/min (ref >60)
Glucose, Bld: 96 mg/dL (ref 70–99)
Potassium: 4.2 mmol/L (ref 3.5–5.1)
Sodium: 140 mmol/L (ref 135–145)
Total Bilirubin: 0.4 mg/dL (ref 0.0–1.2)
Total Protein: 7.7 g/dL (ref 6.5–8.1)

## 2023-06-15 LAB — RESEARCH LABS

## 2023-06-15 LAB — CBC WITH DIFFERENTIAL (CANCER CENTER ONLY)
Abs Immature Granulocytes: 0.01 10*3/uL (ref 0.00–0.07)
Basophils Absolute: 0.1 10*3/uL (ref 0.0–0.1)
Basophils Relative: 1 %
Eosinophils Absolute: 0.3 10*3/uL (ref 0.0–0.5)
Eosinophils Relative: 5 %
HCT: 40.8 % (ref 36.0–46.0)
Hemoglobin: 13.7 g/dL (ref 12.0–15.0)
Immature Granulocytes: 0 %
Lymphocytes Relative: 36 %
Lymphs Abs: 2.2 10*3/uL (ref 0.7–4.0)
MCH: 29.1 pg (ref 26.0–34.0)
MCHC: 33.6 g/dL (ref 30.0–36.0)
MCV: 86.8 fL (ref 80.0–100.0)
Monocytes Absolute: 0.5 10*3/uL (ref 0.1–1.0)
Monocytes Relative: 8 %
Neutro Abs: 3.2 10*3/uL (ref 1.7–7.7)
Neutrophils Relative %: 50 %
Platelet Count: 357 10*3/uL (ref 150–400)
RBC: 4.7 MIL/uL (ref 3.87–5.11)
RDW: 13.3 % (ref 11.5–15.5)
WBC Count: 6.2 10*3/uL (ref 4.0–10.5)
nRBC: 0 % (ref 0.0–0.2)

## 2023-06-15 LAB — GENETIC SCREENING ORDER

## 2023-06-15 NOTE — Assessment & Plan Note (Signed)
 06/06/2023:Screening mammogram detected left breast asymmetry and distortion at 12:00 spiculated mass by ultrasound measuring 1.5 cm, biopsy grade 2 IDC ER 100% PR 100% Ki67 1%, HER2 negative  Pathology and radiology counseling:Discussed with the patient, the details of pathology including the type of breast cancer,the clinical staging, the significance of ER, PR and HER-2/neu receptors and the implications for treatment. After reviewing the pathology in detail, we proceeded to discuss the different treatment options between surgery, radiation, chemotherapy, antiestrogen therapies.  Recommendations: 1. Breast conserving surgery followed by 2. Oncotype DX testing to determine if chemotherapy would be of any benefit followed by 3. Adjuvant radiation therapy followed by 4. Adjuvant antiestrogen therapy 5.  Genetic testing  Oncotype counseling: I discussed Oncotype DX test. I explained to the patient that this is a 21 gene panel to evaluate patient tumors DNA to calculate recurrence score. This would help determine whether patient has high risk or low risk breast cancer. She understands that if her tumor was found to be high risk, she would benefit from systemic chemotherapy. If low risk, no need of chemotherapy.  Return to clinic after surgery to discuss final pathology report and then determine if Oncotype DX testing will need to be sent.

## 2023-06-15 NOTE — Research (Signed)
 [employer] 2021-05 - Specimen Collection Study to Evaluate Biomarkers in Subjects with Cancer   This Nurse has reviewed this patient's inclusion and exclusion criteria as a second review and confirms Amber Stephens is eligible for study participation.  Patient may continue with enrollment.  Ramonia Burns BSN RN Clinical Research Nurse [facility] Direct Dial: [phone] 06/15/2023  11:38 AM

## 2023-06-15 NOTE — Research (Signed)
 [employer] 2021-05 - Specimen Collection Study to Evaluate Biomarkers in Subjects with Cancer     Patient Amber Stephens was identified by Dr. Gudena as a potential candidate for the above listed study.  This Clinical Research Coordinator met with HURLEY BOSEN, [id number], on 06/15/23 in a manner and location that ensures patient privacy to discuss participation in the above listed research study.  Patient is Accompanied by husband.  A copy of the informed consent document with embedded HIPAA language was provided to the patient.  Patient reads, speaks, and understands [place].    Patient was provided with the business card of this Coordinator and encouraged to contact the research team with any questions.  Patient was provided the option of taking informed consent documents home to review and was encouraged to review at their convenience with their support network, including other care providers. Patient is comfortable with making a decision regarding study participation today.  As outlined in the informed consent form, this Coordinator and Lilyannah ZAELEE BIDDIX discussed the purpose of the research study, the investigational nature of the study, study procedures and requirements for study participation, potential risks and benefits of study participation, as well as alternatives to participation. This study is not blinded. The patient understands participation is voluntary and they may withdraw from study participation at any time.  This study does not involve randomization.  This study does not involve an investigational drug or device. This study does not involve a placebo. Patient understands enrollment is pending full eligibility review.   Confidentiality and how the patient's information will be used as part of study participation were discussed.  Patient was informed there is reimbursement provided for their time and effort spent on trial participation.  The patient is encouraged to  discuss research study participation with their insurance provider to determine what costs they may incur as part of study participation, including research related injury.    All questions were answered to patient's satisfaction.  The informed consent with embedded HIPAA language was reviewed page by page.  The patient's mental and emotional status is appropriate to provide informed consent, and the patient verbalizes an understanding of study participation.  Patient has agreed to participate in the above listed research study and has voluntarily signed the informed consent [employer] Approved version 15 Feb 2020 Revised 02 Mar 2021 with embedded HIPAA version 15 Feb 2020 Revised 02 Mar 2021 on 06/15/23 at 11:30 AM.  The patient was provided with a copy of the signed informed consent form with embedded HIPAA language for their reference.  No study specific procedures were obtained prior to the signing of the informed consent document.  Approximately 15 minutes were spent with the patient reviewing the informed consent documents.  Patient was not requested to complete a Release of Information form.   Eligibility: Eligibility criteria reviewed with patient. This nurse/coordinator has reviewed this patient's inclusion and exclusion criteria and confirmed patient is eligible for study participation. Eligibility confirmed by treating investigator, who also agrees that patient should proceed with enrollment.  Patient will continue with enrollment.  Data Collection: Patient was interviewed to collect the following information.  Medical History:  High Blood Pressure  Yes Coronary Artery Disease No Lupus    No Rheumatoid Arthritis  No Diabetes   No       Lynch Syndrome  No  Is the patient currently taking a magnesium supplement?   No   Does the patient have a personal history of cancer (greater than  5 years ago)?  No  Does the patient have a family history of cancer in 1st or 2nd degree relatives?  Yes If yes, Relationship(s) and Cancer type(s)? Dad- Kidney; Aunt-breast  Does the patient have history of alcohol consumption? No     Does the patient have history of cigarette, cigar, pipe, or chewing tobacco use?  No    Blood Collection: Research blood obtained by fresh venipuncture with Fidel Huddle escorting patient. Patient tolerated well without any adverse events.  Gift Card: $50 gift card given to patient for her participation in this study by [employer] Specialist  Patient was thanked for their participation in this study.  Pinkey Brier, MPH  Clinical Research Coordinator

## 2023-06-15 NOTE — Progress Notes (Signed)
 REFERRING PROVIDER: Cameron Cea, MD  PRIMARY PROVIDER:  Dorrene Gaucher, NP  PRIMARY REASON FOR VISIT:  Encounter Diagnoses  Name Primary?   Malignant neoplasm of upper-outer quadrant of left breast in female, estrogen receptor positive (HCC) Yes   Family history of breast cancer    Family history of colon cancer    Family history of kidney cancer    HISTORY OF PRESENT ILLNESS:   [name], a 48 y.o. female, was seen for a [place] cancer genetics consultation during the breast multidisciplinary clinic at the request of Dr. Lee Public due to a personal and family history of cancer.  [name] presents to clinic today to discuss the possibility of a hereditary predisposition to cancer, to discuss genetic testing, and to further clarify her future cancer risks, as well as potential cancer risks for family members.   CANCER HISTORY:  In May 2025, at the age of 35, Ms. Belous was diagnosed with invasive ductal carcinoma of the left breast (ER/PR positive, HER2 negative). The treatment plan includes a lumpectomy followed by oncotype testing, adjuvant radiation, and antiestrogen therapy.   Past Medical History:  Diagnosis Date   Allergic rhinitis    Anemia    borderline, prior to hysterectomy   Asthma    Fatigue    History of pneumonia age 29 and agin   my mom said at age 40 and again at age 64    Hypertension 05/28/2020   Multiple food allergies    Shortness of breath    Shortness of breath on exertion     Past Surgical History:  Procedure Laterality Date   ABDOMINAL HYSTERECTOMY N/A 06/16/2017   Procedure: HYSTERECTOMY ABDOMINAL TOTAL;  Surgeon: Leontine Rana, MD;  Location: [facility];  Service: Gynecology;  Laterality: N/A;   BILATERAL SALPINGECTOMY Bilateral 06/16/2017   Procedure: BILATERAL SALPINGECTOMY;  Surgeon: Leontine Rana, MD;  Location: [facility];  Service: Gynecology;  Laterality: Bilateral;   BREAST BIOPSY Left 06/06/2023    US  LT BREAST BX W LOC DEV 1ST LESION IMG BX SPEC US  GUIDE 06/06/2023 [facility] MAMMOGRAPHY   BTL  11/2008   Dr Asencion Blacksmith; bilateral tubal ligation    CESAREAN SECTION     x4 , with each child , has 4 children    DILATION AND CURETTAGE OF UTERUS  07/02/2012   MYOMECTOMY  2001   Dr Sabino Crafts at [facility]     Social History   Socioeconomic History   Marital status: Married    Spouse name: marcus   Number of children: 4   Years of education: Not on file   Highest education level: Master's degree (e.g., MA, MS, MEng, MEd, MSW, MBA)  Occupational History   Occupation: Doctor, general practice: [employer]  Tobacco Use   Smoking status: Never   Smokeless tobacco: Never  Vaping Use   Vaping status: Never Used  Substance and Sexual Activity   Alcohol use: Never   Drug use: No   Sexual activity: Yes    Partners: Male  Other Topics Concern   Not on file  Social History Narrative   4 kids (G4P3)      2002- son, Marrianne Six.   2003- Ayshia Saint   2005-[name]   2010- [name]       [employer] faculty member (healthcare administration)         Also teaches Medical reception at [employer]      Enjoys reading, shopping, cooking  Married 21 years    Social Drivers of Corporate investment banker Strain: Low Risk  (01/24/2023)   Overall Financial Resource Strain (CARDIA)    Difficulty of Paying Living Expenses: Not hard at all  Food Insecurity: No Food Insecurity (01/24/2023)   Hunger Vital Sign    Worried About Running Out of Food in the Last Year: Never true    Ran Out of Food in the Last Year: Never true  Transportation Needs: No Transportation Needs (01/24/2023)   PRAPARE - Administrator, Civil Service (Medical): No    Lack of Transportation (Non-Medical): No  Physical Activity: Insufficiently Active (01/24/2023)   Exercise Vital Sign    Days of Exercise per Week: 3 days    Minutes of Exercise per Session: 30 min  Stress: No  Stress Concern Present (01/24/2023)   [employer] of Occupational Health - Occupational Stress Questionnaire    Feeling of Stress : Not at all  Social Connections: Socially Integrated (01/24/2023)   Social Connection and Isolation Panel [NHANES]    Frequency of Communication with Friends and Family: More than three times a week    Frequency of Social Gatherings with Friends and Family: More than three times a week    Attends Religious Services: More than 4 times per year    Active Member of [employer] or Organizations: Yes    Attends Engineer, structural: More than 4 times per year    Marital Status: Married     FAMILY HISTORY:  We obtained a detailed, 4-generation family history.  Significant diagnoses are listed below: Encounter Diagnoses  Name Primary?   Malignant neoplasm of upper-outer quadrant of left breast in female, estrogen receptor positive (HCC) Yes   Family history of breast cancer    Family history of colon cancer    Family history of kidney cancer      Ms. Hazelrigg is unaware of previous family history of genetic testing for hereditary cancer risks. There is no reported Ashkenazi Jewish ancestry.   GENETIC COUNSELING ASSESSMENT: Ms. Fosnaugh is a 48 y.o. female with a personal and family history of cancer which is somewhat suggestive of a hereditary cancer syndrome and predisposition to cancer given her young age at diagnosis. We, therefore, discussed and recommended the following at today's visit.   DISCUSSION: We discussed that 5 - 10% of cancer is hereditary, with most cases of hereditary breast cancer associated with mutations in BRCA1/2.  There are other genes that can be associated with hereditary breast cancer syndromes. Type of cancer risk and level of risk are gene-specific. We discussed that testing is beneficial for several reasons including knowing how to follow individuals after completing their treatment, identifying whether potential treatment  options would be beneficial, and understanding if other family members could be at risk for cancer and allowing them to undergo genetic testing.   We reviewed the characteristics, features and inheritance patterns of hereditary cancer syndromes. We also discussed genetic testing, including the appropriate family members to test, the process of testing, insurance coverage and turn-around-time for results. We discussed the implications of a negative, positive and/or variant of uncertain significant result. In order to get genetic test results in a timely manner so that Ms. Savko can use these genetic test results for surgical decisions, we recommended Ms. Sweet pursue genetic testing for the BRCAplus. Once complete, we recommend Ms. Mccutchen pursue reflex genetic testing to a more comprehensive gene panel.   Ms. Shane elected to have Rendell Carrel  CancerNext-Expanded Panel. The CancerNext-Expanded gene panel offered by [facility] and includes sequencing, rearrangement, and RNA analysis for the following 77 genes: AIP, ALK, APC, ATM, AXIN2, BAP1, BARD1, BMPR1A, BRCA1, BRCA2, BRIP1, CDC73, CDH1, CDK4, CDKN1B, CDKN2A, CEBPA, CHEK2, CTNNA1, DDX41, DICER1, ETV6, FH, FLCN, GATA2, LZTR1, MAX, MBD4, MEN1, MET, MLH1, MSH2, MSH3, MSH6, MUTYH, NF1, NF2, NTHL1, PALB2, PHOX2B, PMS2, POT1, PRKAR1A, PTCH1, PTEN, RAD51C, RAD51D, RB1, RET, RPS20, RUNX1, SDHA, SDHAF2, SDHB, SDHC, SDHD, SMAD4, SMARCA4, SMARCB1, SMARCE1, STK11, SUFU, TMEM127, TP53, TSC1, TSC2, VHL, and WT1 (sequencing and deletion/duplication); EGFR, HOXB13, KIT, MITF, PDGFRA, POLD1, and POLE (sequencing only); EPCAM and GREM1 (deletion/duplication only).   Based on Ms. Sacra's personal and family history of cancer, she meets medical criteria for genetic testing. Despite that she meets criteria, she may still have an out of pocket cost. We discussed that if her out of pocket cost for testing is over $100, the laboratory should contact them to discuss  self-pay prices, patient pay assistance programs, if applicable, and other billing options.   PLAN: After considering the risks, benefits, and limitations, Ms. Granat provided informed consent to pursue genetic testing and the blood sample was sent to [facility] for analysis of the CancerNext-Expanded Panel. Results should be available within approximately 1-2 weeks' time, at which point they will be disclosed by telephone to Ms. Pfohl, as will any additional recommendations warranted by these results. Ms. Stroebel will receive a summary of her genetic counseling visit and a copy of her results once available. This information will also be available in Epic.   Ms. Griebel questions were answered to her satisfaction today. Our contact information was provided should additional questions or concerns arise. Thank you for the referral and allowing us  to share in the care of your patient.   Latoi Giraldo, MS, [facility] Genetic Counselor [email or web address] (P) [phone]  40 minutes were spent on the date of the encounter in service to the patient including preparation, face-to-face consultation, documentation and care coordination.  The patient brought her husband. Drs. Gudena and/or Maryalice Smaller were available to discuss this case as needed.  _______________________________________________________________________ For Office Staff:  Number of people involved in session: 2 Was an Intern/ student involved with case: no

## 2023-06-15 NOTE — Therapy (Signed)
 OUTPATIENT PHYSICAL THERAPY BREAST CANCER BASELINE EVALUATION   Patient Name: Amber Stephens MRN: [id number] DOB:[birth year], 48 y.o., female Today's Date: 06/15/2023  END OF SESSION:  PT End of Session - 06/15/23 1251     Visit Number 1    Number of Visits 2    Date for PT Re-Evaluation 08/10/23    PT Start Time 0922    PT Stop Time 0951    PT Time Calculation (min) 29 min    Activity Tolerance Patient tolerated treatment well    Behavior During Therapy [facility] for tasks assessed/performed             Past Medical History:  Diagnosis Date   Allergic rhinitis    Anemia    borderline, prior to hysterectomy   Asthma    Fatigue    History of pneumonia age 56 and agin   my mom said at age 62 and again at age 56    Hypertension 05/28/2020   Multiple food allergies    Shortness of breath    Shortness of breath on exertion    Past Surgical History:  Procedure Laterality Date   ABDOMINAL HYSTERECTOMY N/A 06/16/2017   Procedure: HYSTERECTOMY ABDOMINAL TOTAL;  Surgeon: Leontine Rana, MD;  Location: [facility];  Service: Gynecology;  Laterality: N/A;   BILATERAL SALPINGECTOMY Bilateral 06/16/2017   Procedure: BILATERAL SALPINGECTOMY;  Surgeon: Leontine Rana, MD;  Location: [facility];  Service: Gynecology;  Laterality: Bilateral;   BREAST BIOPSY Left 06/06/2023   US  LT BREAST BX W LOC DEV 1ST LESION IMG BX SPEC US  GUIDE 06/06/2023 [facility] MAMMOGRAPHY   BTL  11/2008   Dr Asencion Blacksmith; bilateral tubal ligation    CESAREAN SECTION     x4 , with each child , has 4 children    DILATION AND CURETTAGE OF UTERUS  07/02/2012   MYOMECTOMY  2001   Dr Sabino Crafts at [facility]    Patient Active Problem List   Diagnosis Date Noted   Malignant neoplasm of upper-outer quadrant of left breast in female, estrogen receptor positive (HCC) 06/13/2023   Generalized obesity 03/03/2022   Abnormal craving 10/05/2021   Other hyperlipidemia 08/05/2021   Prediabetes  03/30/2021   Vitamin D  insufficiency 03/30/2021   Obesity (BMI 30.0-34.9) 12/03/2020   Hypertension 05/28/2020   S/P TAH (total abdominal hysterectomy) 06/16/2017   Physical exam, annual 10/30/2012   Allergic rhinitis 05/15/2007   Asthma 05/15/2007    REFERRING PROVIDER: Dr. Lockie Rima  REFERRING DIAG: Left breast cancer  THERAPY DIAG:  Malignant neoplasm of upper-outer quadrant of left breast in female, estrogen receptor positive (HCC)  Abnormal posture  Rationale for Evaluation and Treatment: Rehabilitation  ONSET DATE: 05/18/2023  SUBJECTIVE:  SUBJECTIVE STATEMENT: Patient reports she is here today to be seen by her medical team for her newly diagnosed left breast cancer.   PERTINENT HISTORY:  Patient was diagnosed on 05/18/2023 with left grade 2 invasive ductal carcinoma breast cancer. It measures 1.5 cm and is located in the upper outer quadrant. It is ER/PR positive and HER2 negative with a Ki67 of 1%.   PATIENT GOALS:   reduce lymphedema risk and learn post op HEP.   PAIN:  Are you having pain? No  PRECAUTIONS: Active CA   RED FLAGS: None   HAND DOMINANCE: right  WEIGHT BEARING RESTRICTIONS: No  FALLS:  Has patient fallen in last 6 months? No  LIVING ENVIRONMENT: Patient lives with: her husband and 4 kids ages 27-23 Lives in: House/apartment Has following equipment at home: None  OCCUPATION: Human resources officer at [employer]: She walks for 1 hour 5 days per week  PRIOR LEVEL OF FUNCTION: Independent   OBJECTIVE: Note: Objective measures were completed at Evaluation unless otherwise noted.  COGNITION: Overall cognitive status: Within functional limits for tasks assessed    POSTURE:  Forward head and rounded shoulders posture  UPPER  EXTREMITY AROM/PROM:  A/PROM RIGHT   eval   Shoulder extension 52  Shoulder flexion 141  Shoulder abduction 167  Shoulder internal rotation 74  Shoulder external rotation 90    (Blank rows = not tested)  A/PROM LEFT   eval  Shoulder extension 53  Shoulder flexion 143  Shoulder abduction 150  Shoulder internal rotation 69  Shoulder external rotation 77    (Blank rows = not tested)  CERVICAL AROM: All within normal limits  UPPER EXTREMITY STRENGTH: WNL  LYMPHEDEMA ASSESSMENTS (in cm):   LANDMARK RIGHT   eval  10 cm proximal to olecranon process 33.4  Olecranon process 26.7  10 cm proximal to ulnar styloid process 25  Just proximal to ulnar styloid process 14.9  Across hand at thumb web space 19.3  At base of 2nd digit 6.2  (Blank rows = not tested)  LANDMARK LEFT   eval  10 cm proximal to olecranon process 32.9  Olecranon process 26.2  10 cm proximal to ulnar styloid process 24  Just proximal to ulnar styloid process 14.9  Across hand at thumb web space 17.3  At base of 2nd digit 6  (Blank rows = not tested)  L-DEX LYMPHEDEMA SCREENING:  The patient was assessed using the L-Dex machine today to produce a lymphedema index baseline score. The patient will be reassessed on a regular basis (typically every 3 months) to obtain new L-Dex scores. If the score is > 6.5 points away from his/her baseline score indicating onset of subclinical lymphedema, it will be recommended to wear a compression garment for 4 weeks, 12 hours per day and then be reassessed. If the score continues to be > 6.5 points from baseline at reassessment, we will initiate lymphedema treatment. Assessing in this manner has a 95% rate of preventing clinically significant lymphedema.   L-DEX FLOWSHEETS - 06/15/23 1200       L-DEX LYMPHEDEMA SCREENING   Measurement Type Unilateral    L-DEX MEASUREMENT EXTREMITY Upper Extremity    POSITION  Standing    DOMINANT SIDE Right    At Risk Side Left     BASELINE SCORE (UNILATERAL) 4.2             QUICK DASH SURVEY:  Amber Stephens - 06/15/23 0001     Open a tight or  new jar No difficulty    Do heavy household chores (wash walls, wash floors) No difficulty    Carry a shopping bag or briefcase No difficulty    Wash your back No difficulty    Use a knife to cut food No difficulty    Recreational activities in which you take some force or impact through your arm, shoulder, or hand (golf, hammering, tennis) No difficulty    During the past week, to what extent has your arm, shoulder or hand problem interfered with your normal social activities with family, friends, neighbors, or groups? Not at all    During the past week, to what extent has your arm, shoulder or hand problem limited your work or other regular daily activities Not at all    Arm, shoulder, or hand pain. None    Tingling (pins and needles) in your arm, shoulder, or hand None    Difficulty Sleeping No difficulty    DASH Score 0 %              PATIENT EDUCATION:  Education details: Time spent educating patient on aspects of self-care to maximize post op recovery. Patient was educated on where and how to get a post op compression bra to use to reduce post op edema. Patient was also educated on the use of SOZO screenings and surveillance principles for early identification of lymphedema onset. She was instructed to use the post op pillow in the axilla for pressure and pain relief. Patient educated on lymphedema risk reduction and post op shoulder/posture HEP. Person educated: Patient Education method: Explanation, Demonstration, Handout Education comprehension: Patient verbalized understanding and returned demonstration  HOME EXERCISE PROGRAM: Patient was instructed today in a home exercise program today for post op shoulder range of motion. These included active assist shoulder flexion in sitting, scapular retraction, wall walking with shoulder abduction, and hands behind  head external rotation.  She was encouraged to do these twice a day, holding 3 seconds and repeating 5 times when permitted by her physician.   ASSESSMENT:  CLINICAL IMPRESSION: Patient was diagnosed on 05/18/2023 with left grade 2 invasive ductal carcinoma breast cancer. It measures 1.5 cm and is located in the upper outer quadrant. It is ER/PR positive and HER2 negative with a Ki67 of 1%. Her multidisciplinary medical team met prior to her assessments to determine a recommended treatment plan. She is planning to have a left lumpectomy and sentinel node biopsy followed by Oncotype testing, radiation, and anti-estrogen therapy. She will benefit from a post op PT reassessment to determine needs and from L-Dex screens every 3 months for 2 years to detect subclinical lymphedema.  Pt will benefit from skilled therapeutic intervention to improve on the following deficits: Decreased knowledge of precautions, impaired UE functional use, pain, decreased ROM, postural dysfunction.   PT treatment/interventions: ADL/self-care home management, pt/family education, therapeutic exercise  REHAB POTENTIAL: Excellent  CLINICAL DECISION MAKING: Stable/uncomplicated  EVALUATION COMPLEXITY: Low   GOALS: Goals reviewed with patient? YES  LONG TERM GOALS: (STG=LTG)    Name Target Date Goal status  1 Pt will be able to verbalize understanding of pertinent lymphedema risk reduction practices relevant to her dx specifically related to skin care.  Baseline:  No knowledge 06/15/2023 Achieved at eval  2 Pt will be able to return demo and/or verbalize understanding of the post op HEP related to regaining shoulder ROM. Baseline:  No knowledge 06/15/2023 Achieved at eval  3 Pt will be able to verbalize understanding of the importance  of viewing the post op After Breast CA Class video for further lymphedema risk reduction education and therapeutic exercise.  Baseline:  No knowledge 06/15/2023 Achieved at eval  4 Pt will  demo she has regained full shoulder ROM and function post operatively compared to baselines.  Baseline: See objective measurements taken today. 08/10/2023     PLAN:  PT FREQUENCY/DURATION: EVAL and 1 follow up appointment.   PLAN FOR NEXT SESSION: will reassess 3-4 weeks post op to determine needs.   Patient will follow up at outpatient cancer rehab 3-4 weeks following surgery.  If the patient requires physical therapy at that time, a specific plan will be dictated and sent to the referring physician for approval. The patient was educated today on appropriate basic range of motion exercises to begin post operatively and the importance of viewing the After Breast Cancer class video following surgery.  Patient was educated today on lymphedema risk reduction practices as it pertains to recommendations that will benefit the patient immediately following surgery.  She verbalized good understanding.    Physical Therapy Information for After Breast Cancer Surgery/Treatment:  Lymphedema is a swelling condition that you may be at risk for in your arm if you have lymph nodes removed from the armpit area.  After a sentinel node biopsy, the risk is approximately 5-9% and is higher after an axillary node dissection.  There is treatment available for this condition and it is not life-threatening.  Contact your physician or physical therapist with concerns. You may begin the 4 shoulder/posture exercises (see additional sheet) when permitted by your physician (typically a week after surgery).  If you have drains, you may need to wait until those are removed before beginning range of motion exercises.  A general recommendation is to not lift your arms above shoulder height until drains are removed.  These exercises should be done to your tolerance and gently.  This is not a "no pain/no gain" type of recovery so listen to your body and stretch into the range of motion that you can tolerate, stopping if you have pain.   If you are having immediate reconstruction, ask your plastic surgeon about doing exercises as he or she may want you to wait. We encourage you to view the After Breast Cancer class video following surgery.  You will learn information related to lymphedema risk, prevention and treatment and additional exercises to regain mobility following surgery.   While undergoing any medical procedure or treatment, try to avoid blood pressure being taken or needle sticks from occurring on the arm on the side of cancer.   This recommendation begins after surgery and continues for the rest of your life.  This may help reduce your risk of getting lymphedema (swelling in your arm). An excellent resource for those seeking information on lymphedema is the National Lymphedema Network's web site. It can be accessed at www.lymphnet.org If you notice swelling in your hand, arm or breast at any time following surgery (even if it is many years from now), please contact your doctor or physical therapist to discuss this.  Lymphedema can be treated at any time but it is easier for you if it is treated early on.  If you feel like your shoulder motion is not returning to normal in a reasonable amount of time, please contact your surgeon or physical therapist.  [facility] [phone]. [address], [address], [place] [address]  [facility] CLASS After Breast Cancer Class  After Breast Cancer  Class is a specially designed exercise class video to assist you in a safe recover after having breast cancer surgery.  In this video you will learn how to get back to full function whether your drains were just removed or if you had surgery a month ago. The video can be viewed on this page: [email or web address].boyd-meyer.org/ or on YouTube here: https://[email or web address].  Class Goals  Understand specific stretches to improve the flexibility of you chest and  shoulder. Learn ways to safely strengthen your upper body and improve your posture. Understand the warning signs of infection and why you may be at risk for an arm infection. Learn about Lymphedema and prevention.  ** You do not need to view this video until after surgery.  Drains should be removed to participate in the recommended exercises on the video.  Patient was instructed today in a home exercise program today for post op shoulder range of motion. These included active assist shoulder flexion in sitting, scapular retraction, wall walking with shoulder abduction, and hands behind head external rotation.  She was encouraged to do these twice a day, holding 3 seconds and repeating 5 times when permitted by her physician.  Rollin Clock, [place] 06/15/23 1:17 PM

## 2023-06-15 NOTE — Progress Notes (Signed)
 [facility] Clinical Social Work  Initial Assessment   Amber Stephens is a 48 y.o. year old female accompanied by husband. Clinical Social Work was referred by [facility] for assessment of psychosocial needs.   SDOH (Social Determinants of Health) assessments performed: Yes SDOH Interventions    Flowsheet Row Clinical Support from 06/15/2023 in [facility] - A Dept Of [place]. [facility]  SDOH Interventions   Food Insecurity Interventions Intervention Not Indicated  Housing Interventions Intervention Not Indicated  Transportation Interventions Intervention Not Indicated  Utilities Interventions Intervention Not Indicated       SDOH Screenings   Food Insecurity: No Food Insecurity (06/15/2023)  Housing: Low Risk  (06/15/2023)  Transportation Needs: No Transportation Needs (06/15/2023)  Utilities: Not At Risk (06/15/2023)  Depression (PHQ2-9): Low Risk  (06/16/2022)  Financial Resource Strain: Low Risk  (01/24/2023)  Physical Activity: Insufficiently Active (01/24/2023)  Social Connections: Socially Integrated (01/24/2023)  Stress: No Stress Concern Present (01/24/2023)  Tobacco Use: Low Risk  (06/15/2023)     Distress Screen completed: No     No data to display            Family/Social Information:  Housing Arrangement: patient lives with husband and their 4 children Marrianne Six- 23, Stewart 21, [name] 19 & [name] 14) Family members/support persons in your life? 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  Income source: Employment Financial concerns: No Type of concern: None Food access concerns: no Religious or spiritual practice: Yes-strong connection to her faith and is part of a faith community Advanced directives: No Services Currently in place:  [facility]  Coping/ Adjustment to diagnosis: Patient understands treatment  plan and what happens next? yes, feels good after meeting with her medical team and learning more about diagnosis and treatment Concerns about diagnosis and/or treatment: Not especially worried about treatment. Only worry is telling her daughter since her daughter has a friend whose mom died from cancer last year Patient reported stressors: Adjusting to my illness Patient enjoys reading, time with family/ friends, and shopping, travel Current coping skills/ strengths: Ability for insight , Manufacturing systems engineer , Motivation for treatment/growth , Religious Affiliation , Special hobby/interest , and Supportive family/friends     SUMMARY: Current SDOH Barriers:  No major barriers identified today  Clinical Social Work Clinical Goal(s):  No clinical social work goals at this time  Interventions: Discussed common feeling and emotions when being diagnosed with cancer, and the importance of support during treatment Informed patient of the support team roles and support services at [facility] Provided CSW contact information and encouraged patient to call with any questions or concerns   Follow Up Plan: Patient will contact CSW with any support or resource needs Patient verbalizes understanding of plan: Yes    Neal Trulson E Shirel Mallis, LCSW Clinical Social Worker [facility]

## 2023-06-15 NOTE — Progress Notes (Signed)
 [facility] CONSULT NOTE  Patient Care Team: Dorrene Gaucher, NP as PCP - General (Internal Medicine) Belle Box, MD as Consulting Physician (Obstetrics and Gynecology) Auther Bo, RN as Oncology Nurse Navigator Alane Hsu, RN as Oncology Nurse Navigator Cameron Cea, MD as Consulting Physician (Hematology and Oncology) Lockie Rima, MD as Consulting Physician (General Surgery) Colie Dawes, MD as Attending Physician (Radiation Oncology)  CHIEF COMPLAINTS/PURPOSE OF CONSULTATION:  Newly diagnosed breast cancer  HISTORY OF PRESENTING ILLNESS:  [name] is a 48 year old who had a screening mammogram that detected left breast asymmetry and distortion at 12 o'clock position.  There was a spiculated mass measuring 1.5 cm by ultrasound.  Axilla was negative.  Biopsy revealed grade 2 invasive ductal carcinoma ER 100% PR 100% Ki67 1% HER2 negative.  She was presented this morning at the multidisciplinary tumor board and she is here today to discuss her treatment plan accompanied by her husband.  She has a family history of an aunt who had breast cancer that was triple positive.  I reviewed her records extensively and collaborated the history with the patient.  SUMMARY OF ONCOLOGIC HISTORY: Oncology History  Malignant neoplasm of upper-outer quadrant of left breast in female, estrogen receptor positive (HCC)  06/06/2023 Initial Diagnosis   Screening mammogram detected left breast asymmetry and distortion at 12:00 spiculated mass by ultrasound measuring 1.5 cm, biopsy grade 2 IDC ER 100% PR 100% Ki67 1%, HER2 negative   06/15/2023 Cancer Staging   Staging form: Breast, AJCC 8th Edition - Clinical: Stage IA (cT1c, cN0, cM0, G2, ER+, PR+, HER2-) - Signed by Cameron Cea, MD on 06/15/2023 Stage prefix: Initial diagnosis Histologic grading system: 3 grade system      MEDICAL HISTORY:  Past Medical History:  Diagnosis Date   Allergic rhinitis     Anemia    borderline, prior to hysterectomy   Asthma    Fatigue    History of pneumonia age 36 and agin   my mom said at age 8 and again at age 22    Hypertension 05/28/2020   Multiple food allergies    Shortness of breath    Shortness of breath on exertion     SURGICAL HISTORY: Past Surgical History:  Procedure Laterality Date   ABDOMINAL HYSTERECTOMY N/A 06/16/2017   Procedure: HYSTERECTOMY ABDOMINAL TOTAL;  Surgeon: Leontine Rana, MD;  Location: [facility];  Service: Gynecology;  Laterality: N/A;   BILATERAL SALPINGECTOMY Bilateral 06/16/2017   Procedure: BILATERAL SALPINGECTOMY;  Surgeon: Leontine Rana, MD;  Location: [facility];  Service: Gynecology;  Laterality: Bilateral;   BREAST BIOPSY Left 06/06/2023   US  LT BREAST BX W LOC DEV 1ST LESION IMG BX SPEC US  GUIDE 06/06/2023 [facility] MAMMOGRAPHY   BTL  11/2008   Dr Asencion Blacksmith; bilateral tubal ligation    CESAREAN SECTION     x4 , with each child , has 4 children    DILATION AND CURETTAGE OF UTERUS  07/02/2012   MYOMECTOMY  2001   Dr Sabino Crafts at [facility]     SOCIAL HISTORY: Social History   Socioeconomic History   Marital status: Married    Spouse name: marcus   Number of children: 4   Years of education: Not on file   Highest education level: Master's degree (e.g., MA, MS, MEng, MEd, MSW, MBA)  Occupational History   Occupation: Doctor, general practice: [employer]  Tobacco Use   Smoking status: Never  Smokeless tobacco: Never  Vaping Use   Vaping status: Never Used  Substance and Sexual Activity   Alcohol use: Never   Drug use: No   Sexual activity: Yes    Partners: Male  Other Topics Concern   Not on file  Social History Narrative   4 kids (G4P3)      2002- son, Marrianne Six.   2003- Khalea Weith   2005-[name]   2010- [name]       Barrington Lied faculty member (healthcare administration)         Also teaches Medical reception at [employer]       Enjoys reading, shopping, cooking      Married 21 years    Social Drivers of [employer]: Low Risk  (01/24/2023)   Overall Financial Resource Strain (CARDIA)    Difficulty of Paying Living Expenses: Not hard at all  Food Insecurity: No Food Insecurity (01/24/2023)   Hunger Vital Sign    Worried About Running Out of Food in the Last Year: Never true    Ran Out of Food in the Last Year: Never true  Transportation Needs: No Transportation Needs (01/24/2023)   PRAPARE - Administrator, Civil Service (Medical): No    Lack of Transportation (Non-Medical): No  Physical Activity: Insufficiently Active (01/24/2023)   Exercise Vital Sign    Days of Exercise per Week: 3 days    Minutes of Exercise per Session: 30 min  Stress: No Stress Concern Present (01/24/2023)   [employer] of Occupational Health - Occupational Stress Questionnaire    Feeling of Stress : Not at all  Social Connections: Socially Integrated (01/24/2023)   Social Connection and Isolation Panel [NHANES]    Frequency of Communication with Friends and Family: More than three times a week    Frequency of Social Gatherings with Friends and Family: More than three times a week    Attends Religious Services: More than 4 times per year    Active Member of [employer] or Organizations: Yes    Attends Engineer, structural: More than 4 times per year    Marital Status: Married  Catering manager Violence: Unknown (05/07/2021)   Received from [employer], [facility]   HITS    Physically Hurt: Not on file    Insult or Talk Down To: Not on file    Threaten Physical Harm: Not on file    Scream or Curse: Not on file    FAMILY HISTORY: Family History  Problem Relation Age of Onset   Alzheimer's disease Mother        died at 39 (diagnosed at 76)   Stroke Mother    Breast cancer Mother    Hypertension Father    Stroke Maternal Grandmother    Diabetes Maternal Grandmother    Diabetes  Maternal Grandfather        (had bilateral amputation and blindness)   Colon cancer Maternal Grandfather 42   AAA (abdominal aortic aneurysm) Maternal Grandfather    Stomach cancer Neg Hx    Esophageal cancer Neg Hx     ALLERGIES:  is allergic to grass pollen(k-o-r-t-swt vern), phenol, and other.  MEDICATIONS:  Current Outpatient Medications  Medication Sig Dispense Refill   albuterol  (PROAIR  HFA) 108 (90 Base) MCG/ACT inhaler USE 1-2 PUFFS BY MOUTH EVERY 4 HOURS AS NEEDED wheezing and shortness of breath 3 each 0   amLODipine  (NORVASC ) 5 MG tablet TAKE 1 TABLET BY MOUTH EVERY DAY  90 tablet 1   estradiol (VIVELLE-DOT) 0.1 MG/24HR patch Place 1 patch onto the skin 2 (two) times a week.     Phentermine  HCl (LOMAIRA ) 8 MG TABS Take 1 tablet (8 mg total) by mouth 2 (two) times daily. 60 tablet 0   No current facility-administered medications for this visit.    REVIEW OF SYSTEMS:   Constitutional: Denies fevers, chills or abnormal night sweats Breast:  Denies any palpable lumps or discharge All other systems were reviewed with the patient and are negative.  PHYSICAL EXAMINATION: ECOG PERFORMANCE STATUS: 0 - Asymptomatic  Vitals:   06/15/23 0853  BP: (!) 148/85  Pulse: 68  Resp: 17  Temp: 98.9 F (37.2 C)  SpO2: 100%   Filed Weights   06/15/23 0853  Weight: 180 lb 14.4 oz (82.1 kg)    GENERAL:alert, no distress and comfortable  LABORATORY DATA:  I have reviewed the data as listed Lab Results  Component Value Date   WBC 5.8 03/16/2023   HGB 13.9 03/16/2023   HCT 41.7 03/16/2023   MCV 90 03/16/2023   PLT 366 03/16/2023   Lab Results  Component Value Date   NA 139 05/04/2023   K 4.7 05/04/2023   CL 102 05/04/2023   CO2 25 05/04/2023    RADIOGRAPHIC STUDIES: I have personally reviewed the radiological reports and agreed with the findings in the report.  ASSESSMENT AND PLAN:  Malignant neoplasm of upper-outer quadrant of left breast in female, estrogen receptor  positive (HCC) 06/06/2023:Screening mammogram detected left breast asymmetry and distortion at 12:00 spiculated mass by ultrasound measuring 1.5 cm, biopsy grade 2 IDC ER 100% PR 100% Ki67 1%, HER2 negative  Pathology and radiology counseling:Discussed with the patient, the details of pathology including the type of breast cancer,the clinical staging, the significance of ER, PR and HER-2/neu receptors and the implications for treatment. After reviewing the pathology in detail, we proceeded to discuss the different treatment options between surgery, radiation, chemotherapy, antiestrogen therapies.  Recommendations: 1. Breast conserving surgery followed by 2. Oncotype DX testing to determine if chemotherapy would be of any benefit followed by 3. Adjuvant radiation therapy followed by 4. Adjuvant antiestrogen therapy 5.  Genetic testing  Oncotype counseling: I discussed Oncotype DX test. I explained to the patient that this is a 21 gene panel to evaluate patient tumors DNA to calculate recurrence score. This would help determine whether patient has high risk or low risk breast cancer. She understands that if her tumor was found to be high risk, she would benefit from systemic chemotherapy. If low risk, no need of chemotherapy.  Return to clinic after surgery to discuss final pathology report and then determine if Oncotype DX testing will need to be sent.    All questions were answered. The patient knows to call the clinic with any problems, questions or concerns.    Viinay K Claudius Mich, MD 06/15/23

## 2023-06-20 ENCOUNTER — Other Ambulatory Visit: Payer: Self-pay | Admitting: General Surgery

## 2023-06-20 ENCOUNTER — Encounter: Payer: Self-pay | Admitting: Nurse Practitioner

## 2023-06-20 ENCOUNTER — Ambulatory Visit: Admitting: Nurse Practitioner

## 2023-06-20 VITALS — BP 120/86 | HR 75 | Temp 98.3°F | Ht 62.0 in | Wt 175.0 lb

## 2023-06-20 DIAGNOSIS — Z170 Estrogen receptor positive status [ER+]: Secondary | ICD-10-CM | POA: Diagnosis not present

## 2023-06-20 DIAGNOSIS — E66811 Obesity, class 1: Secondary | ICD-10-CM | POA: Diagnosis not present

## 2023-06-20 DIAGNOSIS — C50412 Malignant neoplasm of upper-outer quadrant of left female breast: Secondary | ICD-10-CM

## 2023-06-20 DIAGNOSIS — Z6832 Body mass index (BMI) 32.0-32.9, adult: Secondary | ICD-10-CM

## 2023-06-20 NOTE — Progress Notes (Signed)
 Office: [phone]  /  Fax: [phone]  WEIGHT SUMMARY AND BIOMETRICS  Weight Lost Since Last Visit: 2lb  Weight Gained Since Last Visit: 0lb   Vitals Temp: 98.3 F (36.8 C) BP: 120/86 Pulse Rate: 75 SpO2: 99 %   Anthropometric Measurements Height: 5\' 2"  (1.575 m) Weight: 175 lb (79.4 kg) BMI (Calculated): 32 Weight at Last Visit: 177lb Weight Lost Since Last Visit: 2lb Weight Gained Since Last Visit: 0lb Starting Weight: 205lb Total Weight Loss (lbs): 30 lb (13.6 kg)   Body Composition  Body Fat %: 31.8 % Fat Mass (lbs): 55.8 lbs Muscle Mass (lbs): 113.4 lbs Total Body Water (lbs): 41.7 lbs Visceral Fat Rating : 7   Other Clinical Data Fasting: Yes Labs: No Today's Visit #: 40 Starting Date: 03/26/21     HPI  Chief Complaint: OBESITY  Amber Stephens is here to discuss Amber Stephens progress with Amber Stephens obesity treatment plan. Amber Stephens is on the the Category 2 Plan and states Amber Stephens is following Amber Stephens eating plan approximately 80-90 % of the time. Amber Stephens states Amber Stephens is exercising 60 minutes 5 days per week.   Interval History:  Since last office visit Amber Stephens has lost 2 pounds.  Patient has been diagnosed with breast cancer since Amber Stephens last visit.  Amber Stephens is scheduled for a lumpectomy 6/10.  Amber Stephens saw oncology last on 06/15/23.    Goal weight:  165 lbs   Amber Stephens is going on a cruise on May 24th.     Pharmacotherapy for weight loss: Amber Stephens is currently taking taking Lomaira  8mg  once daily over the past 1-2 weeks-decreased from BID.  Denies side effects. Has helped with polyphagia and cravings.     Previous pharmacotherapy for medical weight loss:  Lomaira  & Wellbutrin  (stopped due to tremors)   Bariatric surgery:  Patient has not had bariatric surgery.    PHYSICAL EXAM:  Blood pressure 120/86, pulse 75, temperature 98.3 F (36.8 C), height 5\' 2"  (1.575 m), weight 175 lb (79.4 kg), last menstrual period 06/04/2017, SpO2 99%. Body mass index is 32.01 kg/m.  General: Amber Stephens is overweight,  cooperative, alert, well developed, and in no acute distress. PSYCH: Has normal mood, affect and thought process.   Extremities: No edema.  Neurologic: No gross sensory or motor deficits. No tremors or fasciculations noted.    DIAGNOSTIC DATA REVIEWED:  BMET    Component Value Date/Time   NA 140 06/15/2023 1220   NA 139 05/04/2023 1218   K 4.2 06/15/2023 1220   CL 106 06/15/2023 1220   CO2 29 06/15/2023 1220   GLUCOSE 96 06/15/2023 1220   BUN 20 06/15/2023 1220   BUN 13 05/04/2023 1218   CREATININE 0.94 06/15/2023 1220   CALCIUM 9.6 06/15/2023 1220   GFRNONAA >60 06/15/2023 1220   GFRAA >60 06/07/2017 0915   Lab Results  Component Value Date   HGBA1C 5.8 (H) 03/16/2023   HGBA1C 5.7 (H) 03/26/2021   Lab Results  Component Value Date   INSULIN  8.3 03/16/2023   INSULIN  19.1 03/26/2021   Lab Results  Component Value Date   TSH 0.741 03/16/2023   CBC    Component Value Date/Time   WBC 6.2 06/15/2023 1220   WBC 5.1 04/03/2019 1005   RBC 4.70 06/15/2023 1220   HGB 13.7 06/15/2023 1220   HGB 13.9 03/16/2023 1050   HCT 40.8 06/15/2023 1220   HCT 41.7 03/16/2023 1050   PLT 357 06/15/2023 1220   PLT 366 03/16/2023 1050   MCV 86.8 06/15/2023 1220  MCV 90 03/16/2023 1050   MCH 29.1 06/15/2023 1220   MCHC 33.6 06/15/2023 1220   RDW 13.3 06/15/2023 1220   RDW 12.4 03/16/2023 1050   Iron Studies    Component Value Date/Time   IRON 106 10/30/2012 1137   IRONPCTSAT 28.6 10/30/2012 1137   Lipid Panel     Component Value Date/Time   CHOL 160 03/16/2023 1050   TRIG 69 03/16/2023 1050   HDL 53 03/16/2023 1050   CHOLHDL 3 05/28/2020 0831   VLDL 11.4 05/28/2020 0831   LDLCALC 94 03/16/2023 1050   Hepatic Function Panel     Component Value Date/Time   PROT 7.7 06/15/2023 1220   PROT 7.2 03/16/2023 1050   ALBUMIN 4.5 06/15/2023 1220   ALBUMIN 4.4 03/16/2023 1050   AST 18 06/15/2023 1220   ALT 18 06/15/2023 1220   ALKPHOS 48 06/15/2023 1220   BILITOT 0.4  06/15/2023 1220   BILIDIR 0.1 04/03/2019 1005      Component Value Date/Time   TSH 0.741 03/16/2023 1050   Nutritional Lab Results  Component Value Date   VD25OH 39.0 03/16/2023   VD25OH 83.1 03/03/2022   VD25OH 72.4 08/05/2021     ASSESSMENT AND PLAN  TREATMENT PLAN FOR OBESITY:  Recommended Dietary Goals  Amber Stephens is currently in the action stage of change. As such, Amber Stephens goal is to continue weight management plan. Amber Stephens has agreed to practicing portion control and making smarter food choices, such as increasing vegetables and decreasing simple carbohydrates.  Behavioral Intervention  We discussed the following Behavioral Modification Strategies today: increasing lean protein intake to established goals, decreasing simple carbohydrates , increasing vegetables, increasing fiber rich foods, increasing water intake , work on meal planning and preparation, and continue to work on maintaining a reduced calorie state, getting the recommended amount of protein, incorporating whole foods, making healthy choices, staying well hydrated and practicing mindfulness when eating..  Additional resources provided today: NA  Recommended Physical Activity Goals  Amber Stephens has been advised to work up to 150 minutes of moderate intensity aerobic activity a week and strengthening exercises 2-3 times per week for cardiovascular health, weight loss maintenance and preservation of muscle mass.   Amber Stephens has agreed to Continue current level of physical activity and per surgeon's recommendation.   Pharmacotherapy We discussed various medication options to help Amber Stephens with Amber Stephens weight loss Stephens and we both agreed to continue Lomaira  8mg  once daily. Will stop 2 weeks prior to surgery.  ASSOCIATED CONDITIONS ADDRESSED TODAY  Action/Plan  Malignant neoplasm of upper-outer quadrant of left breast in female, estrogen receptor positive (HCC) Keep follow up appts with oncology and surgeon.    Class 1 obesity  due to excess calories without serious comorbidity with body mass index (BMI) of 32.0 to 32.9 in adult Will stop Lomaira  2 weeks prior to surgery Will return after seeing the surgeon for follow up.     Return in about 8 weeks (around 08/15/2023).Aaron Aas Amber Stephens was informed of the importance of frequent follow up visits to maximize Amber Stephens success with intensive lifestyle modifications for Amber Stephens multiple health conditions.   ATTESTASTION STATEMENTS:  Reviewed by clinician on day of visit: allergies, medications, problem list, medical history, surgical history, family history, social history, and previous encounter notes.   Time spent on visit including pre-visit chart review and post-visit care and charting was 30 minutes.    Crist Dominion. Brennon Otterness FNP-C

## 2023-06-21 ENCOUNTER — Encounter: Payer: 59 | Admitting: Family

## 2023-06-22 ENCOUNTER — Ambulatory Visit: Admitting: Nurse Practitioner

## 2023-06-23 ENCOUNTER — Encounter: Payer: Self-pay | Admitting: *Deleted

## 2023-06-23 ENCOUNTER — Telehealth: Payer: Self-pay | Admitting: *Deleted

## 2023-06-23 ENCOUNTER — Telehealth: Payer: Self-pay

## 2023-06-23 NOTE — Telephone Encounter (Signed)
 Spoke with patient to follow up from [facility] 5/14 and assess navigation needs. Patient denies any questions or concerns at this time. Encouraged her to call should anything arise.

## 2023-06-24 ENCOUNTER — Encounter: Payer: Self-pay | Admitting: Genetic Counselor

## 2023-06-24 ENCOUNTER — Telehealth: Payer: Self-pay | Admitting: Genetic Counselor

## 2023-06-24 DIAGNOSIS — Z1379 Encounter for other screening for genetic and chromosomal anomalies: Secondary | ICD-10-CM | POA: Insufficient documentation

## 2023-06-24 NOTE — Telephone Encounter (Signed)
 I contacted Ms. Yono to discuss her genetic testing results. No pathogenic variants were identified in the 13 genes analyzed. Of note, we are still waiting on the pan-cancer panel. Detailed clinic note to follow.  The test report has been scanned into EPIC and is located under the Molecular Pathology section of the Results Review tab.  A portion of the result report is included below for reference.   Elihue Ebert, MS, [facility] Genetic Counselor [email or web address] (P) [phone]

## 2023-06-28 ENCOUNTER — Telehealth: Payer: Self-pay | Admitting: Hematology and Oncology

## 2023-06-28 ENCOUNTER — Encounter: Payer: Self-pay | Admitting: *Deleted

## 2023-06-28 NOTE — Telephone Encounter (Signed)
 Left vm for pt about scheduled appt date and time.

## 2023-06-30 ENCOUNTER — Telehealth: Payer: Self-pay | Admitting: Genetic Counselor

## 2023-06-30 NOTE — Telephone Encounter (Signed)
 I attempted to contact Ms. Kupper to discuss her genetic testing results (77 genes). I left a voicemail requesting she call me back at [phone].  Ina Poupard, MS, [facility] Genetic Counselor [email or web address] (P) [phone]

## 2023-07-05 ENCOUNTER — Other Ambulatory Visit: Payer: Self-pay

## 2023-07-05 ENCOUNTER — Encounter (HOSPITAL_BASED_OUTPATIENT_CLINIC_OR_DEPARTMENT_OTHER): Payer: Self-pay | Admitting: General Surgery

## 2023-07-06 ENCOUNTER — Encounter (HOSPITAL_BASED_OUTPATIENT_CLINIC_OR_DEPARTMENT_OTHER)
Admission: RE | Admit: 2023-07-06 | Discharge: 2023-07-06 | Disposition: A | Discharge status: Home / Self Care | Source: Ambulatory Visit | Attending: General Surgery | Admitting: General Surgery

## 2023-07-06 ENCOUNTER — Telehealth: Payer: Self-pay | Admitting: Genetic Counselor

## 2023-07-06 DIAGNOSIS — Z01818 Encounter for other preprocedural examination: Secondary | ICD-10-CM | POA: Insufficient documentation

## 2023-07-06 MED ORDER — CHLORHEXIDINE GLUCONATE CLOTH 2 % EX PADS: 6.0000 | MEDICATED_PAD | Freq: Once | CUTANEOUS | Status: DC

## 2023-07-06 NOTE — Progress Notes (Signed)
      Enhanced Recovery after Surgery  Enhanced Recovery after Surgery is a protocol used to improve the stress on your body and your recovery after surgery.  Patient Instructions  The night before surgery:  No food after midnight. ONLY clear liquids after midnight  The day of surgery (if you do NOT have diabetes):  Drink ONE (1) Pre-Surgery Clear Ensure as directed.   This drink was given to you during your hospital  pre-op appointment visit. The pre-op nurse will instruct you on the time to drink the  Pre-Surgery Ensure depending on your surgery time. Finish the drink at the designated time by the pre-op nurse.  Nothing else to drink after completing the  Pre-Surgery Clear Ensure.  The day of surgery (if you have diabetes): Drink ONE (1) Gatorade 2 (G2) as directed. This drink was given to you during your hospital  pre-op appointment visit.  The pre-op nurse will instruct you on the time to drink the   Gatorade 2 (G2) depending on your surgery time. Color of the Gatorade may vary. Red is not allowed. Nothing else to drink after completing the  Gatorade 2 (G2).         If you have questions, please contact your surgeon's office.  Surgical soap given with instructions

## 2023-07-06 NOTE — Telephone Encounter (Signed)
 I contacted Ms. Friedli to discuss her genetic testing results. No pathogenic variants were identified in the 77 genes analyzed. Detailed clinic note to follow.  The test report has been scanned into EPIC and is located under the Molecular Pathology section of the Results Review tab.  A portion of the result report is included below for reference.   Jakera Beaupre, MS, [facility] Genetic Counselor [email or web address] (P) [phone]

## 2023-07-08 ENCOUNTER — Ambulatory Visit
Admission: RE | Admit: 2023-07-08 | Discharge: 2023-07-08 | Disposition: A | Discharge status: Home / Self Care | Source: Ambulatory Visit | Attending: General Surgery | Admitting: General Surgery

## 2023-07-08 DIAGNOSIS — Z170 Estrogen receptor positive status [ER+]: Secondary | ICD-10-CM

## 2023-07-08 HISTORY — PX: BREAST BIOPSY: SHX20

## 2023-07-11 ENCOUNTER — Ambulatory Visit: Payer: Self-pay | Admitting: Genetic Counselor

## 2023-07-11 ENCOUNTER — Encounter: Payer: Self-pay | Admitting: Genetic Counselor

## 2023-07-11 DIAGNOSIS — Z1379 Encounter for other screening for genetic and chromosomal anomalies: Secondary | ICD-10-CM

## 2023-07-11 NOTE — Progress Notes (Signed)
 HPI:   Amber Stephens was previously seen in the [facility] due to a personal and family history of cancer and concerns regarding a hereditary predisposition to cancer. Please refer to our prior cancer genetics clinic note for more information regarding our discussion, assessment and recommendations, at the time. Amber Stephens recent genetic test results were disclosed to her, as were recommendations warranted by these results. These results and recommendations are discussed in more detail below.  CANCER HISTORY:  Oncology History  Malignant neoplasm of upper-outer quadrant of left breast in female, estrogen receptor positive (HCC)  06/06/2023 Initial Diagnosis   Screening mammogram detected left breast asymmetry and distortion at 12:00 spiculated mass by ultrasound measuring 1.5 cm, biopsy grade 2 IDC ER 100% PR 100% Ki67 1%, HER2 negative   06/15/2023 Cancer Staging   Staging form: Breast, AJCC 8th Edition - Clinical: Stage IA (cT1c, cN0, cM0, G2, ER+, PR+, HER2-) - Signed by Cameron Cea, MD on 06/15/2023 Stage prefix: Initial diagnosis Histologic grading system: 3 grade system    Genetic Testing   [facility] CancerNext-Expanded Panel+RNA was Negative. Report date is 06/27/2023.   The CancerNext-Expanded gene panel offered by [facility] and includes sequencing, rearrangement, and RNA analysis for the following 77 genes: AIP, ALK, APC, ATM, AXIN2, BAP1, BARD1, BMPR1A, BRCA1, BRCA2, BRIP1, CDC73, CDH1, CDK4, CDKN1B, CDKN2A, CEBPA, CHEK2, CTNNA1, DDX41, DICER1, ETV6, FH, FLCN, GATA2, LZTR1, MAX, MBD4, MEN1, MET, MLH1, MSH2, MSH3, MSH6, MUTYH, NF1, NF2, NTHL1, PALB2, PHOX2B, PMS2, POT1, PRKAR1A, PTCH1, PTEN, RAD51C, RAD51D, RB1, RET, RPS20, RUNX1, SDHA, SDHAF2, SDHB, SDHC, SDHD, SMAD4, SMARCA4, SMARCB1, SMARCE1, STK11, SUFU, TMEM127, TP53, TSC1, TSC2, VHL, and WT1 (sequencing and deletion/duplication); EGFR, HOXB13, KIT, MITF, PDGFRA, POLD1, and POLE (sequencing only); EPCAM and  GREM1 (deletion/duplication only).       We obtained a detailed, 4-generation family history.  Significant diagnoses are listed below:     Encounter Diagnoses  Name Primary?   Malignant neoplasm of upper-outer quadrant of left breast in female, estrogen receptor positive (HCC) Yes   Family history of breast cancer     Family history of colon cancer     Family history of kidney cancer         Ms. Bordas is unaware of previous family history of genetic testing for hereditary cancer risks. There is no reported Ashkenazi Jewish ancestry.   GENETIC TEST RESULTS:  The [facility] CancerNext-Expanded Panel found no pathogenic mutations.  The CancerNext-Expanded gene panel offered by [facility] and includes sequencing, rearrangement, and RNA analysis for the following 77 genes: AIP, ALK, APC, ATM, AXIN2, BAP1, BARD1, BMPR1A, BRCA1, BRCA2, BRIP1, CDC73, CDH1, CDK4, CDKN1B, CDKN2A, CEBPA, CHEK2, CTNNA1, DDX41, DICER1, ETV6, FH, FLCN, GATA2, LZTR1, MAX, MBD4, MEN1, MET, MLH1, MSH2, MSH3, MSH6, MUTYH, NF1, NF2, NTHL1, PALB2, PHOX2B, PMS2, POT1, PRKAR1A, PTCH1, PTEN, RAD51C, RAD51D, RB1, RET, RPS20, RUNX1, SDHA, SDHAF2, SDHB, SDHC, SDHD, SMAD4, SMARCA4, SMARCB1, SMARCE1, STK11, SUFU, TMEM127, TP53, TSC1, TSC2, VHL, and WT1 (sequencing and deletion/duplication); EGFR, HOXB13, KIT, MITF, PDGFRA, POLD1, and POLE (sequencing only); EPCAM and GREM1 (deletion/duplication only).   The test report has been scanned into EPIC and is located under the Molecular Pathology section of the Results Review tab.  A portion of the result report is included below for reference. Genetic testing reported out on 06/27/2023.       Even though a pathogenic variant was not identified, possible explanations for her personal history of cancer may include: There may be no hereditary risk for cancer in  the family. The cancers in [name] and/or her family may be due to other genetic or environmental factors. There may be a  gene mutation in one of these genes that current testing methods cannot detect, but that chance is small. There could be another gene that has not yet been discovered, or that we have not yet tested, that is responsible for the cancer diagnoses in the family.   Therefore, it is important to remain in touch with cancer genetics in the future so that we can continue to offer [name] the most up to date genetic testing.   ADDITIONAL GENETIC TESTING:  We discussed with [name] that her genetic testing was fairly extensive.  If there are genes identified to increase cancer risk that can be analyzed in the future, we would be happy to discuss and coordinate this testing at that time.     CANCER SCREENING RECOMMENDATIONS:  [name] test result is considered negative (normal).  This means that we have not identified a hereditary cause for her personal and family history of cancer at this time.   An individual's cancer risk and medical management are not determined by genetic test results alone. Overall cancer risk assessment incorporates additional factors, including personal medical history, family history, and any available genetic information that may result in a personalized plan for cancer prevention and surveillance. Therefore, it is recommended she continue to follow the cancer management and screening guidelines provided by her oncology and primary healthcare provider.  RECOMMENDATIONS FOR FAMILY MEMBERS:   Since she did not inherit a mutation in a cancer predisposition gene included on this panel, her children could not have inherited a mutation from her in one of these genes. Individuals in this family might be at some increased risk of developing cancer, over the general population risk, due to the family history of cancer. We recommend women in this family have a yearly mammogram beginning at age 57, or 48 years younger than the earliest onset of cancer, an annual clinical breast  exam, and perform monthly breast self-exams.   FOLLOW-UP:  Cancer genetics is a rapidly advancing field and it is possible that new genetic tests will be appropriate for her and/or her family members in the future. We encouraged her to remain in contact with cancer genetics on an annual basis so we can update her personal and family histories and let her know of advances in cancer genetics that may benefit this family.   Our contact number was provided. Ms. Shanafelt questions were answered to her satisfaction, and she knows she is welcome to call us  at anytime with additional questions or concerns.   Joram Venson, MS, [facility] Genetic Counselor [email or web address] (P) [phone]

## 2023-07-12 ENCOUNTER — Encounter (HOSPITAL_BASED_OUTPATIENT_CLINIC_OR_DEPARTMENT_OTHER): Payer: Self-pay | Admitting: General Surgery

## 2023-07-12 ENCOUNTER — Ambulatory Visit (HOSPITAL_BASED_OUTPATIENT_CLINIC_OR_DEPARTMENT_OTHER)
Admission: RE | Admit: 2023-07-12 | Discharge: 2023-07-12 | Disposition: A | Discharge status: Home / Self Care | Attending: General Surgery | Admitting: General Surgery

## 2023-07-12 ENCOUNTER — Encounter (HOSPITAL_BASED_OUTPATIENT_CLINIC_OR_DEPARTMENT_OTHER)
Admission: RE | Disposition: A | Discharge status: Home / Self Care | Payer: Self-pay | Source: Home / Self Care | Attending: General Surgery

## 2023-07-12 ENCOUNTER — Other Ambulatory Visit: Payer: Self-pay

## 2023-07-12 ENCOUNTER — Ambulatory Visit (HOSPITAL_BASED_OUTPATIENT_CLINIC_OR_DEPARTMENT_OTHER): Admitting: Anesthesiology

## 2023-07-12 ENCOUNTER — Ambulatory Visit
Admission: RE | Admit: 2023-07-12 | Discharge: 2023-07-12 | Disposition: A | Discharge status: Home / Self Care | Source: Ambulatory Visit | Attending: General Surgery | Admitting: General Surgery

## 2023-07-12 DIAGNOSIS — Z79899 Other long term (current) drug therapy: Secondary | ICD-10-CM | POA: Diagnosis not present

## 2023-07-12 DIAGNOSIS — N6042 Mammary duct ectasia of left breast: Secondary | ICD-10-CM | POA: Diagnosis not present

## 2023-07-12 DIAGNOSIS — Z170 Estrogen receptor positive status [ER+]: Secondary | ICD-10-CM | POA: Insufficient documentation

## 2023-07-12 DIAGNOSIS — C50412 Malignant neoplasm of upper-outer quadrant of left female breast: Secondary | ICD-10-CM | POA: Insufficient documentation

## 2023-07-12 DIAGNOSIS — Z1732 Human epidermal growth factor receptor 2 negative status: Secondary | ICD-10-CM | POA: Insufficient documentation

## 2023-07-12 DIAGNOSIS — J45909 Unspecified asthma, uncomplicated: Secondary | ICD-10-CM | POA: Diagnosis not present

## 2023-07-12 DIAGNOSIS — Z8249 Family history of ischemic heart disease and other diseases of the circulatory system: Secondary | ICD-10-CM | POA: Diagnosis not present

## 2023-07-12 DIAGNOSIS — Z803 Family history of malignant neoplasm of breast: Secondary | ICD-10-CM | POA: Insufficient documentation

## 2023-07-12 DIAGNOSIS — Z1721 Progesterone receptor positive status: Secondary | ICD-10-CM | POA: Insufficient documentation

## 2023-07-12 DIAGNOSIS — I10 Essential (primary) hypertension: Secondary | ICD-10-CM | POA: Diagnosis not present

## 2023-07-12 DIAGNOSIS — N6022 Fibroadenosis of left breast: Secondary | ICD-10-CM | POA: Insufficient documentation

## 2023-07-12 HISTORY — PX: BREAST LUMPECTOMY WITH RADIOACTIVE SEED AND SENTINEL LYMPH NODE BIOPSY: SHX6550

## 2023-07-12 HISTORY — DX: Malignant (primary) neoplasm, unspecified: C80.1

## 2023-07-12 SURGERY — BREAST LUMPECTOMY WITH RADIOACTIVE SEED AND SENTINEL LYMPH NODE BIOPSY
Anesthesia: General | Site: Breast | Laterality: Left

## 2023-07-12 MED ORDER — BUPIVACAINE HCL (PF) 0.5 % IJ SOLN
INTRAMUSCULAR | Status: DC | PRN
  Administered 2023-07-12: 10 mL

## 2023-07-12 MED ORDER — CEFAZOLIN SODIUM-DEXTROSE 2-4 GM/100ML-% IV SOLN
INTRAVENOUS | Status: AC
  Filled 2023-07-12: qty 100

## 2023-07-12 MED ORDER — ACETAMINOPHEN 500 MG PO TABS
ORAL_TABLET | ORAL | Status: AC
  Filled 2023-07-12: qty 2

## 2023-07-12 MED ORDER — FENTANYL CITRATE (PF) 100 MCG/2ML IJ SOLN
INTRAMUSCULAR | Status: AC
Start: 2023-07-12 — End: ?
  Filled 2023-07-12: qty 2

## 2023-07-12 MED ORDER — ONDANSETRON HCL 4 MG/2ML IJ SOLN
INTRAMUSCULAR | Status: DC | PRN
  Administered 2023-07-12: 4 mg via INTRAVENOUS

## 2023-07-12 MED ORDER — OXYCODONE HCL 5 MG PO TABS: 5.0000 mg | ORAL_TABLET | Freq: Once | ORAL | Status: DC | PRN

## 2023-07-12 MED ORDER — SUCCINYLCHOLINE CHLORIDE 200 MG/10ML IV SOSY
PREFILLED_SYRINGE | INTRAVENOUS | Status: AC
  Filled 2023-07-12: qty 10

## 2023-07-12 MED ORDER — PHENYLEPHRINE 80 MCG/ML (10ML) SYRINGE FOR IV PUSH (FOR BLOOD PRESSURE SUPPORT)
PREFILLED_SYRINGE | INTRAVENOUS | Status: AC
  Filled 2023-07-12: qty 10

## 2023-07-12 MED ORDER — CEFAZOLIN SODIUM-DEXTROSE 2-4 GM/100ML-% IV SOLN
2.0000 g | INTRAVENOUS | Status: AC
  Administered 2023-07-12: 2 g via INTRAVENOUS

## 2023-07-12 MED ORDER — MIDAZOLAM HCL 5 MG/5ML IJ SOLN
INTRAMUSCULAR | Status: DC | PRN
  Administered 2023-07-12: 2 mg via INTRAVENOUS

## 2023-07-12 MED ORDER — OXYCODONE HCL 5 MG/5ML PO SOLN: 5.0000 mg | Freq: Once | ORAL | Status: DC | PRN

## 2023-07-12 MED ORDER — BUPIVACAINE LIPOSOME 1.3 % IJ SUSP
INTRAMUSCULAR | Status: AC
  Filled 2023-07-12: qty 10

## 2023-07-12 MED ORDER — FENTANYL CITRATE (PF) 100 MCG/2ML IJ SOLN
INTRAMUSCULAR | Status: DC | PRN
  Administered 2023-07-12 (×2): 50 ug via INTRAVENOUS

## 2023-07-12 MED ORDER — MIDAZOLAM HCL 2 MG/2ML IJ SOLN
2.0000 mg | Freq: Once | INTRAMUSCULAR | Status: AC
  Administered 2023-07-12: 2 mg via INTRAVENOUS

## 2023-07-12 MED ORDER — BUPIVACAINE LIPOSOME 1.3 % IJ SUSP
INTRAMUSCULAR | Status: DC | PRN
  Administered 2023-07-12: 10 mL

## 2023-07-12 MED ORDER — HYDROMORPHONE HCL 1 MG/ML IJ SOLN
INTRAMUSCULAR | Status: AC
  Filled 2023-07-12: qty 0.5

## 2023-07-12 MED ORDER — PROPOFOL 10 MG/ML IV BOLUS
INTRAVENOUS | Status: DC | PRN
  Administered 2023-07-12: 150 mg via INTRAVENOUS

## 2023-07-12 MED ORDER — DEXAMETHASONE SODIUM PHOSPHATE 4 MG/ML IJ SOLN
INTRAMUSCULAR | Status: DC | PRN
  Administered 2023-07-12: 5 mg via INTRAVENOUS

## 2023-07-12 MED ORDER — OXYCODONE HCL 5 MG PO TABS: 5.0000 mg | ORAL_TABLET | Freq: Four times a day (QID) | ORAL | 0 refills | Status: DC | PRN

## 2023-07-12 MED ORDER — LACTATED RINGERS IV SOLN: INTRAVENOUS | Status: DC

## 2023-07-12 MED ORDER — MIDAZOLAM HCL 2 MG/2ML IJ SOLN
INTRAMUSCULAR | Status: AC
  Filled 2023-07-12: qty 2

## 2023-07-12 MED ORDER — ONDANSETRON HCL 4 MG/2ML IJ SOLN
INTRAMUSCULAR | Status: AC
  Filled 2023-07-12: qty 2

## 2023-07-12 MED ORDER — ATROPINE SULFATE 0.4 MG/ML IV SOLN
INTRAVENOUS | Status: AC
  Filled 2023-07-12: qty 1

## 2023-07-12 MED ORDER — LIDOCAINE HCL 1 % IJ SOLN: INTRAMUSCULAR | Status: DC | PRN

## 2023-07-12 MED ORDER — EPHEDRINE SULFATE (PRESSORS) 50 MG/ML IJ SOLN
INTRAMUSCULAR | Status: DC | PRN
  Administered 2023-07-12 (×2): 10 mg via INTRAVENOUS

## 2023-07-12 MED ORDER — FENTANYL CITRATE (PF) 100 MCG/2ML IJ SOLN
INTRAMUSCULAR | Status: AC
  Filled 2023-07-12: qty 2

## 2023-07-12 MED ORDER — HYDROMORPHONE HCL 1 MG/ML IJ SOLN
0.2500 mg | INTRAMUSCULAR | Status: DC | PRN
  Administered 2023-07-12 (×2): 0.5 mg via INTRAVENOUS

## 2023-07-12 MED ORDER — FENTANYL CITRATE (PF) 100 MCG/2ML IJ SOLN
100.0000 ug | Freq: Once | INTRAMUSCULAR | Status: AC
  Administered 2023-07-12: 50 ug via INTRAVENOUS

## 2023-07-12 MED ORDER — 0.9 % SODIUM CHLORIDE (POUR BTL) OPTIME
TOPICAL | Status: DC | PRN
  Administered 2023-07-12: 1000 mL

## 2023-07-12 MED ORDER — EPHEDRINE 5 MG/ML INJ
INTRAVENOUS | Status: AC
  Filled 2023-07-12: qty 5

## 2023-07-12 MED ORDER — ACETAMINOPHEN 500 MG PO TABS
1000.0000 mg | ORAL_TABLET | ORAL | Status: AC
  Administered 2023-07-12: 1000 mg via ORAL

## 2023-07-12 MED ORDER — DEXAMETHASONE SODIUM PHOSPHATE 10 MG/ML IJ SOLN
INTRAMUSCULAR | Status: AC
  Filled 2023-07-12: qty 1

## 2023-07-12 MED ORDER — LIDOCAINE HCL (CARDIAC) PF 100 MG/5ML IV SOSY
PREFILLED_SYRINGE | INTRAVENOUS | Status: DC | PRN
  Administered 2023-07-12: 20 mg via INTRAVENOUS

## 2023-07-12 MED ORDER — DROPERIDOL 2.5 MG/ML IJ SOLN: 0.6250 mg | Freq: Once | INTRAMUSCULAR | Status: DC | PRN

## 2023-07-12 MED ORDER — MAGTRACE LYMPHATIC TRACER
INTRAMUSCULAR | Status: DC | PRN
  Administered 2023-07-12: 2 mL via INTRAMUSCULAR

## 2023-07-12 MED ORDER — LIDOCAINE 2% (20 MG/ML) 5 ML SYRINGE
INTRAMUSCULAR | Status: AC
  Filled 2023-07-12: qty 5

## 2023-07-12 SURGICAL SUPPLY — 54 items
BINDER BREAST LRG (GAUZE/BANDAGES/DRESSINGS) IMPLANT
BINDER BREAST MEDIUM (GAUZE/BANDAGES/DRESSINGS) IMPLANT
BINDER BREAST XLRG (GAUZE/BANDAGES/DRESSINGS) IMPLANT
BINDER BREAST XXLRG (GAUZE/BANDAGES/DRESSINGS) IMPLANT
BLADE SURG 10 STRL SS (BLADE) ×1 IMPLANT
BLADE SURG 15 STRL LF DISP TIS (BLADE) ×1 IMPLANT
BNDG COHESIVE 4X5 TAN STRL LF (GAUZE/BANDAGES/DRESSINGS) ×1 IMPLANT
CANISTER SUC SOCK COL 7IN (MISCELLANEOUS) IMPLANT
CANISTER SUCT 1200ML W/VALVE (MISCELLANEOUS) ×1 IMPLANT
CHLORAPREP W/TINT 26 (MISCELLANEOUS) ×1 IMPLANT
CLIP TI LARGE 6 (CLIP) ×1 IMPLANT
CLIP TI MEDIUM 6 (CLIP) ×2 IMPLANT
CLIP TI WIDE RED SMALL 6 (CLIP) IMPLANT
COVER MAYO STAND STRL (DRAPES) ×2 IMPLANT
COVER PROBE CYLINDRICAL 5X96 (MISCELLANEOUS) ×1 IMPLANT
DERMABOND ADVANCED .7 DNX12 (GAUZE/BANDAGES/DRESSINGS) ×1 IMPLANT
DRAPE UTILITY XL STRL (DRAPES) ×1 IMPLANT
ELECT COATED BLADE 2.86 ST (ELECTRODE) ×1 IMPLANT
ELECTRODE REM PT RTRN 9FT ADLT (ELECTROSURGICAL) ×1 IMPLANT
GAUZE PAD ABD 8X10 STRL (GAUZE/BANDAGES/DRESSINGS) ×1 IMPLANT
GAUZE SPONGE 4X4 12PLY STRL LF (GAUZE/BANDAGES/DRESSINGS) ×1 IMPLANT
GLOVE BIO SURGEON STRL SZ 6 (GLOVE) ×1 IMPLANT
GLOVE BIOGEL PI IND STRL 6.5 (GLOVE) ×1 IMPLANT
GOWN STRL REUS W/ TWL LRG LVL3 (GOWN DISPOSABLE) ×1 IMPLANT
GOWN STRL REUS W/ TWL XL LVL3 (GOWN DISPOSABLE) ×1 IMPLANT
KIT MARKER MARGIN INK (KITS) ×1 IMPLANT
LIGHT WAVEGUIDE WIDE FLAT (MISCELLANEOUS) IMPLANT
NDL HYPO 25X1 1.5 SAFETY (NEEDLE) ×1 IMPLANT
NDL SAFETY ECLIPSE 18X1.5 (NEEDLE) ×1 IMPLANT
NEEDLE HYPO 25X1 1.5 SAFETY (NEEDLE) ×2 IMPLANT
NS IRRIG 1000ML POUR BTL (IV SOLUTION) ×1 IMPLANT
PACK BASIN DAY SURGERY FS (CUSTOM PROCEDURE TRAY) ×1 IMPLANT
PACK UNIVERSAL I (CUSTOM PROCEDURE TRAY) ×1 IMPLANT
PENCIL SMOKE EVACUATOR (MISCELLANEOUS) ×1 IMPLANT
SLEEVE SCD COMPRESS KNEE MED (STOCKING) ×1 IMPLANT
SPIKE FLUID TRANSFER (MISCELLANEOUS) IMPLANT
SPONGE T-LAP 18X18 ~~LOC~~+RFID (SPONGE) ×2 IMPLANT
STAPLER SKIN PROX WIDE 3.9 (STAPLE) IMPLANT
STOCKINETTE IMPERVIOUS LG (DRAPES) ×1 IMPLANT
STRIP CLOSURE SKIN 1/2X4 (GAUZE/BANDAGES/DRESSINGS) ×1 IMPLANT
SUT ETHILON 2 0 FS 18 (SUTURE) IMPLANT
SUT MNCRL AB 4-0 PS2 18 (SUTURE) ×1 IMPLANT
SUT MON AB 5-0 PS2 18 (SUTURE) IMPLANT
SUT SILK 2 0 SH (SUTURE) IMPLANT
SUT VIC AB 2-0 SH 27XBRD (SUTURE) ×1 IMPLANT
SUT VIC AB 3-0 SH 27X BRD (SUTURE) ×1 IMPLANT
SUT VICRYL 3-0 CR8 SH (SUTURE) ×1 IMPLANT
SYR BULB EAR ULCER 3OZ GRN STR (SYRINGE) ×1 IMPLANT
SYR CONTROL 10ML LL (SYRINGE) ×1 IMPLANT
TOWEL GREEN STERILE FF (TOWEL DISPOSABLE) ×1 IMPLANT
TRACER MAGTRACE VIAL (MISCELLANEOUS) IMPLANT
TRAY FAXITRON CT DISP (TRAY / TRAY PROCEDURE) ×1 IMPLANT
TUBE CONNECTING 20X1/4 (TUBING) ×1 IMPLANT
YANKAUER SUCT BULB TIP NO VENT (SUCTIONS) ×1 IMPLANT

## 2023-07-12 NOTE — H&P (View-Only) (Signed)
 REFERRING PHYSICIAN: O[place]  PROVIDER: Eppie Hasting, MD  Care Team: Patient Care Team: Rochele Christmas, NP as PCP - General (Family Medicine) Eppie Hasting, MD as Consulting Provider (Surgical Oncology) Chaneta Comer, MD (Hematology and Oncology) Lanae Pinal, MD (Radiation Oncology) Denette Finner, MD (Obstetrics and Gynecology)   MRN: [id number] DOB: [birth year] DATE OF ENCOUNTER: 06/15/2023  Subjective   Chief Complaint: New Consultation (Breast Cancer)   History of Present Illness: Amber Stephens is a 48 y.o. female who is seen today as an office consultation at the request of Dr. Gudena for evaluation of New Consultation (Breast Cancer) .   Patient has a new diagnosis of left breast cancer May 2025. The patient presented with screening detected left breast asymmetry. Diagnostic imaging was performed which showed a 1.5 cm mass at 12:00 on the left. Core needle biopsy was performed. This showed a grade 2 invasive ductal carcinoma with DCIS. This was strongly hormone receptor positive and HER2 negative. Ki-67 was 1%.  Family cancer history -grandfather had colon cancer and aunt had breast cancer Menarche -age 70 Menopause -21 with surgery Parity -4 children versus age 59  Work-college instructor healthcare administration  Diagnostic mammogram/us : 06/02/23 [facility] ACR Breast Density Category b: There are scattered areas of fibroglandular density. FINDINGS: Additional views performed today including spot compression tomosynthesis views demonstrate a 1.3 cm irregular mass with associated architectural distortion in the left breast upper inner aspect middle depth approximately 6 cm from the nipple. Ultrasound of the left breast is recommended for further evaluation. Targeted ultrasound of the upper left breast demonstrates a hypoechoic 0.8 by 1.5 x 1 cm irregular mass with a hyperechoic rim in the 12 o'clock position 5 cm from the nipple. This  mass demonstrates posterior acoustic shadowing. There is adjacent blood flow. Targeted ultrasound of the left axilla demonstrates 2 normal appearing lymph nodes. IMPRESSION: Irregular 1.5 cm mass in the left breast at 12 o'clock 5 cm from the nipple is suspicious for malignancy. RECOMMENDATION: Ultrasound-guided core biopsy of left breast mass is recommended. I have discussed the findings and recommendations with the patient. If applicable, a reminder letter will be sent to the patient regarding the next appointment. BI-RADS CATEGORY 5: Highly suggestive of malignancy.  Pathology core needle biopsy: 06/06/23 1. Breast, left, needle core biopsy, 12 o'clock, 5cmfn, coil clip :  INVASIVE MAMMARY CARCINOMA  MAMMARY CARCINOMA IN SITU, SOLID, INTERMEDIATE NUCLEAR GRADE  TUBULE FORMATION: SCORE 3  NUCLEAR PLEOMORPHISM: SCORE 2  MITOTIC COUNT: SCORE 1  TOTAL SCORE: 6  OVERALL GRADE: 2  LYMPHOVASCULAR INVASION: IDENTIFIED  CANCER LENGTH: 7 MM  CALCIFICATIONS: NOT IDENTIFIED  OTHER FINDINGS: NONE   Receptors: Estrogen Receptor: 100%, POSITIVE, STRONG STAINING INTENSITY  Progesterone Receptor: 100%, POSITIVE, STRONG STAINING INTENSITY  Proliferation Marker Ki67: 1%  GROUP 5: HER2 **NEGATIVE**   Review of Systems: A complete review of systems was obtained from the patient. I have reviewed this information and discussed as appropriate with the patient. See HPI as well for other ROS. ROS - otherwise negative  Medical History: Past Medical History:  Diagnosis Date  Anemia  Asthma, unspecified asthma severity, unspecified whether complicated, unspecified whether persistent (HHS-HCC)  Hypertension   Patient Active Problem List  Diagnosis  Malignant neoplasm of upper-outer quadrant of left breast in female, estrogen receptor positive (CMS/HHS-HCC)   Past Surgical History:  Procedure Laterality Date  HYSTERECTOMY  LAPAROSCOPIC SALPINGECTOMY Bilateral    Allergies  Allergen  Reactions  Phenol Shortness Of Breath  Grass  Pollen Other (See Comments)   Current Outpatient Medications on File Prior to Visit  Medication Sig Dispense Refill  albuterol  MDI, PROVENTIL , VENTOLIN , PROAIR , HFA 90 mcg/actuation inhaler USE 1-2 PUFFS BY MOUTH EVERY 4 HOURS AS NEEDED WHEEZING AND SHORTNESS OF BREATH  amLODIPine  (NORVASC ) 5 MG tablet Take 1 tablet by mouth once daily  estradiol (DOTTI) patch 0.1 mg/24 hr Apply 1 patch twice a week by transdermal route for 90 days.  phentermine  (ADIPEX-P) 30 MG capsule Take 30 mg by mouth every morning before breakfast   No current facility-administered medications on file prior to visit.   Family History  Problem Relation Age of Onset  Stroke Mother  Breast cancer Mother  Hyperlipidemia (Elevated cholesterol) Father  High blood pressure (Hypertension) Father  Hyperlipidemia (Elevated cholesterol) Sister    Social History   Tobacco Use  Smoking Status Never  Smokeless Tobacco Never    Social History   Socioeconomic History  Marital status: Married  Tobacco Use  Smoking status: Never  Smokeless tobacco: Never  Vaping Use  Vaping status: Never Used  Substance and Sexual Activity  Alcohol use: Not Currently  Drug use: Never   Social Drivers of Health   Financial Resource Strain: Low Risk (01/24/2023)  Received from [facility]  Overall Financial Resource Strain (CARDIA)  Difficulty of Paying Living Expenses: Not hard at all  Food Insecurity: No Food Insecurity (01/24/2023)  [facility]  Hunger Vital Sign  Worried About Running Out of Food in the Last Year: Never true  Ran Out of Food in the Last Year: Never true  Transportation Needs: No Transportation Needs (01/24/2023)  Received from [facility] - Transportation  Lack of Transportation (Medical): No  Lack of Transportation (Non-Medical): No  Physical Activity: Insufficiently Active (01/24/2023)  Received from [facility]  Exercise Vital  Sign  Days of Exercise per Week: 3 days  Minutes of Exercise per Session: 30 min  Stress: No Stress Concern Present (01/24/2023)  Received from [facility] - Occupational Stress Questionnaire  Feeling of Stress : Not at all  Social Connections: Socially Integrated (01/24/2023)  Received from [facility]  Social Connection and Isolation Panel [NHANES]  Frequency of Communication with Friends and Family: More than three times a week  Frequency of Social Gatherings with Friends and Family: More than three times a week  Attends Religious Services: More than 4 times per year  Active Member of [employer] or Organizations: Yes  Attends Banker Meetings: More than 4 times per year  Marital Status: Married  Housing Stability: Unknown (01/24/2023)  Received from [facility]  Housing Stability Vital Sign  Unable to Pay for Housing in the Last Year: No  Homeless in the Last Year: No   Objective:   Vitals:  06/15/23 1225  BP: (!) 148/85  Pulse: 68  Resp: 17  Temp: 37.2 C (98.9 F)  Weight: 82.1 kg (180 lb 14.4 oz)  Height: 157.5 cm (5\' 2" )   Body mass index is 33.09 kg/m.  Gen: No acute distress. Well nourished and well groomed.  Neurological: Alert and oriented to person, place, and time. Coordination normal.  Head: Normocephalic and atraumatic.  Eyes: Conjunctivae are normal. Pupils are equal, round, and reactive to light. No scleral icterus.  Neck: Normal range of motion. Neck supple. No tracheal deviation or thyromegaly present.  Cardiovascular: Normal rate, regular rhythm, normal heart sounds and intact distal pulses. Exam reveals no gallop and no  friction rub. No murmur heard. Breast: Breasts are ptotic bilaterally. No palpable mass either breast. No nipple retraction or nipple discharge. No skin dimpling. No contour abnormalities. No lymphadenopathy. No significant size discrepancy. No appreciable bruising Respiratory: Effort  normal. No respiratory distress. No chest wall tenderness. Breath sounds normal. No wheezes, rales or rhonchi.  GI: Soft. Bowel sounds are normal. The abdomen is soft and nontender. There is no rebound and no guarding.  Musculoskeletal: Normal range of motion. Extremities are nontender.  Lymphadenopathy: No cervical, preauricular, postauricular or axillary adenopathy is present Skin: Skin is warm and dry. No rash noted. No diaphoresis. No erythema. No pallor. No clubbing, cyanosis, or edema.  Psychiatric: Normal mood and affect. Behavior is normal. Judgment and thought content normal.   Labs None available at this point  Assessment and Plan:   ICD-10-CM  1. Malignant neoplasm of upper-outer quadrant of left breast in female, estrogen receptor positive (CMS/HHS-HCC) C50.412  Z17.0    Patient has a new diagnosis of clinical T1c N0 M+ left breast cancer. This is amenable to breast conservation. We will recommend genetic testing, breast conservation surgery, Oncotype, adjuvant radiation and antihormonal treatment.  We will start with surgical treatment. Again barring some on usual genetic finding, we will plan seed localized lumpectomy with sentinel lymph node biopsy. I discussed the procedure of surgery with the patient including seed placement and sentinel lymph node injection. I discussed the block. I reviewed risk of bleeding, infection, pain, dissatisfaction with appearance or change in breast contour or size. I discussed risk of breast or arm lymphedema and seroma. I reviewed risk of heart or lung complications or blood clot. Discussed risk of pain. We will plan this the first available opportunity after her vacation. I discussed postoperative lifting restrictions.  Patient's films were reviewed in multidisciplinary fashion.   Deliliah Fender, MD FACS Surgical Oncology, General Surgery, Trauma and Critical Care [facility]

## 2023-07-12 NOTE — Discharge Instructions (Addendum)
 [facility] [facility] Office Phone Number [phone]  BREAST BIOPSY/ PARTIAL MASTECTOMY: POST OP INSTRUCTIONS  Always review your discharge instruction sheet given to you by the facility where your surgery was performed.  IF YOU HAVE DISABILITY OR FAMILY LEAVE FORMS, YOU MUST BRING THEM TO THE OFFICE FOR PROCESSING.  DO NOT GIVE THEM TO YOUR DOCTOR.  Take 2 tylenol  (acetominophen) three times a day for 3 days.  If you still have pain, add ibuprofen  with food in between if able to take this (if you have kidney issues or stomach issues, do not take ibuprofen ).  If both of those are not enough, add the narcotic pain pill.  If you find you are needing a lot of this overnight after surgery, call the next morning for a refill.    Prescriptions will not be filled after 5pm or on week-ends. Take your usually prescribed medications unless otherwise directed You should eat very light the first 24 hours after surgery, such as soup, crackers, pudding, etc.  Resume your normal diet the day after surgery. Most patients will experience some swelling and bruising in the breast.  Ice packs and a good support bra will help.  Swelling and bruising can take several days to resolve.  It is common to experience some constipation if taking pain medication after surgery.  Increasing fluid intake and taking a stool softener will usually help or prevent this problem from occurring.  A mild laxative (Milk of Magnesia or Miralax) should be taken according to package directions if there are no bowel movements after 48 hours. Unless discharge instructions indicate otherwise, you may remove your bandages 48 hours after surgery, and you may shower at that time.  You may have steri-strips (small skin tapes) in place directly over the incision.  These strips should be left on the skin at least for for 7-10 days.    ACTIVITIES:  You may resume regular daily activities (gradually increasing) beginning the next day.  Wearing a  good support bra or sports bra (or the breast binder) minimizes pain and swelling.  You may have sexual intercourse when it is comfortable. No heavy lifting for 1-2 weeks (not over around 10 pounds).  You may drive when you no longer are taking prescription pain medication, you can comfortably wear a seatbelt, and you can safely maneuver your car and apply brakes. RETURN TO WORK:  __________3-14 days depending on job. _______________ Amber Stephens should see your doctor in the office for a follow-up appointment approximately two weeks after your surgery.  Your doctor's nurse will typically make your follow-up appointment when she calls you with your pathology report.  Expect your pathology report 3-4 business days after your surgery.  You may call to check if you do not hear from us  after three days.   WHEN TO CALL YOUR DOCTOR: Fever over 101.0 Nausea and/or vomiting. Extreme swelling or bruising. Continued bleeding from incision. Increased pain, redness, or drainage from the incision.  The clinic staff is available to answer your questions during regular business hours.  Please don't hesitate to call and ask to speak to one of the nurses for clinical concerns.  If you have a medical emergency, go to the nearest emergency room or call 911.  A surgeon from [facility] is always on call at the hospital.  For further questions, please visit [email or web address]   No Tylenol  before 3:15pm.   Post Anesthesia Home Care Instructions  Activity: Get plenty of rest for the remainder of the day.  A responsible individual must stay with you for 24 hours following the procedure.  For the next 24 hours, DO NOT: -Drive a car -Advertising copywriter -Drink alcoholic beverages -Take any medication unless instructed by your physician -Make any legal decisions or sign important papers.  Meals: Start with liquid foods such as gelatin or soup. Progress to regular foods as tolerated. Avoid greasy,  spicy, heavy foods. If nausea and/or vomiting occur, drink only clear liquids until the nausea and/or vomiting subsides. Call your physician if vomiting continues.  Special Instructions/Symptoms: Your throat may feel dry or sore from the anesthesia or the breathing tube placed in your throat during surgery. If this causes discomfort, gargle with warm salt water. The discomfort should disappear within 24 hours.

## 2023-07-12 NOTE — Anesthesia Procedure Notes (Signed)
 Procedure Name: LMA Insertion Date/Time: 07/12/2023 12:10 PM  Performed by: Eugenia Hess, CRNAPre-anesthesia Checklist: Patient identified, Emergency Drugs available, Suction available and Patient being monitored Patient Re-evaluated:Patient Re-evaluated prior to induction Oxygen Delivery Method: Circle System Utilized Preoxygenation: Pre-oxygenation with 100% oxygen Induction Type: IV induction Ventilation: Mask ventilation without difficulty LMA: LMA inserted LMA Size: 4.0 Number of attempts: 1 Airway Equipment and Method: bite block Placement Confirmation: positive ETCO2 Tube secured with: Tape Dental Injury: Teeth and Oropharynx as per pre-operative assessment

## 2023-07-12 NOTE — H&P (Signed)
 REFERRING PHYSICIAN: Ormond  PROVIDER: Eppie Hasting, MD  Care Team: Patient Care Team: Rochele Christmas, NP as PCP - General (Family Medicine) Eppie Hasting, MD as Consulting Provider (Surgical Oncology) Chaneta Comer, MD (Hematology and Oncology) Lanae Pinal, MD (Radiation Oncology) Denette Finner, MD (Obstetrics and Gynecology)   MRN: [id number] DOB: [birth year] DATE OF ENCOUNTER: 06/15/2023  Subjective   Chief Complaint: New Consultation (Breast Cancer)   History of Present Illness: Amber Stephens is a 48 y.o. female who is seen today as an office consultation at the request of Dr. Gudena for evaluation of New Consultation (Breast Cancer) .   Patient has a new diagnosis of left breast cancer May 2025. The patient presented with screening detected left breast asymmetry. Diagnostic imaging was performed which showed a 1.5 cm mass at 12:00 on the left. Core needle biopsy was performed. This showed a grade 2 invasive ductal carcinoma with DCIS. This was strongly hormone receptor positive and HER2 negative. Ki-67 was 1%.  Family cancer history -grandfather had colon cancer and aunt had breast cancer Menarche -age 70 Menopause -21 with surgery Parity -4 children versus age 59  Work-college instructor healthcare administration  Diagnostic mammogram/us : 06/02/23 [facility] ACR Breast Density Category b: There are scattered areas of fibroglandular density. FINDINGS: Additional views performed today including spot compression tomosynthesis views demonstrate a 1.3 cm irregular mass with associated architectural distortion in the left breast upper inner aspect middle depth approximately 6 cm from the nipple. Ultrasound of the left breast is recommended for further evaluation. Targeted ultrasound of the upper left breast demonstrates a hypoechoic 0.8 by 1.5 x 1 cm irregular mass with a hyperechoic rim in the 12 o'clock position 5 cm from the nipple. This  mass demonstrates posterior acoustic shadowing. There is adjacent blood flow. Targeted ultrasound of the left axilla demonstrates 2 normal appearing lymph nodes. IMPRESSION: Irregular 1.5 cm mass in the left breast at 12 o'clock 5 cm from the nipple is suspicious for malignancy. RECOMMENDATION: Ultrasound-guided core biopsy of left breast mass is recommended. I have discussed the findings and recommendations with the patient. If applicable, a reminder letter will be sent to the patient regarding the next appointment. BI-RADS CATEGORY 5: Highly suggestive of malignancy.  Pathology core needle biopsy: 06/06/23 1. Breast, left, needle core biopsy, 12 o'clock, 5cmfn, coil clip :  INVASIVE MAMMARY CARCINOMA  MAMMARY CARCINOMA IN SITU, SOLID, INTERMEDIATE NUCLEAR GRADE  TUBULE FORMATION: SCORE 3  NUCLEAR PLEOMORPHISM: SCORE 2  MITOTIC COUNT: SCORE 1  TOTAL SCORE: 6  OVERALL GRADE: 2  LYMPHOVASCULAR INVASION: IDENTIFIED  CANCER LENGTH: 7 MM  CALCIFICATIONS: NOT IDENTIFIED  OTHER FINDINGS: NONE   Receptors: Estrogen Receptor: 100%, POSITIVE, STRONG STAINING INTENSITY  Progesterone Receptor: 100%, POSITIVE, STRONG STAINING INTENSITY  Proliferation Marker Ki67: 1%  GROUP 5: HER2 **NEGATIVE**   Review of Systems: A complete review of systems was obtained from the patient. I have reviewed this information and discussed as appropriate with the patient. See HPI as well for other ROS. ROS - otherwise negative  Medical History: Past Medical History:  Diagnosis Date  Anemia  Asthma, unspecified asthma severity, unspecified whether complicated, unspecified whether persistent (HHS-HCC)  Hypertension   Patient Active Problem List  Diagnosis  Malignant neoplasm of upper-outer quadrant of left breast in female, estrogen receptor positive (CMS/HHS-HCC)   Past Surgical History:  Procedure Laterality Date  HYSTERECTOMY  LAPAROSCOPIC SALPINGECTOMY Bilateral    Allergies  Allergen  Reactions  Phenol Shortness Of Breath  Grass  Pollen Other (See Comments)   Current Outpatient Medications on File Prior to Visit  Medication Sig Dispense Refill  albuterol  MDI, PROVENTIL , VENTOLIN , PROAIR , HFA 90 mcg/actuation inhaler USE 1-2 PUFFS BY MOUTH EVERY 4 HOURS AS NEEDED WHEEZING AND SHORTNESS OF BREATH  amLODIPine  (NORVASC ) 5 MG tablet Take 1 tablet by mouth once daily  estradiol (DOTTI) patch 0.1 mg/24 hr Apply 1 patch twice a week by transdermal route for 90 days.  phentermine  (ADIPEX-P) 30 MG capsule Take 30 mg by mouth every morning before breakfast   No current facility-administered medications on file prior to visit.   Family History  Problem Relation Age of Onset  Stroke Mother  Breast cancer Mother  Hyperlipidemia (Elevated cholesterol) Father  High blood pressure (Hypertension) Father  Hyperlipidemia (Elevated cholesterol) Sister    Social History   Tobacco Use  Smoking Status Never  Smokeless Tobacco Never    Social History   Socioeconomic History  Marital status: Married  Tobacco Use  Smoking status: Never  Smokeless tobacco: Never  Vaping Use  Vaping status: Never Used  Substance and Sexual Activity  Alcohol use: Not Currently  Drug use: Never   Social Drivers of Health   Financial Resource Strain: Low Risk (01/24/2023)  Received from [facility]  Overall Financial Resource Strain (CARDIA)  Difficulty of Paying Living Expenses: Not hard at all  Food Insecurity: No Food Insecurity (01/24/2023)  Received [facility]  Hunger Vital Sign  Worried About Running Out of Food in the Last Year: Never true  Ran Out of Food in the Last Year: Never true  Transportation Needs: No Transportation Needs (01/24/2023)  Received from [facility] - Transportation  Lack of Transportation (Medical): No  Lack of Transportation (Non-Medical): No  Physical Activity: Insufficiently Active (01/24/2023)  Received from [facility]  Exercise Vital  Sign  Days of Exercise per Week: 3 days  Minutes of Exercise per Session: 30 min  Stress: No Stress Concern Present (01/24/2023)  Received from [facility] - Occupational Stress Questionnaire  Feeling of Stress : Not at all  Social Connections: Socially Integrated (01/24/2023)  Received from [facility]  Social Connection and Isolation Panel [NHANES]  Frequency of Communication with Friends and Family: More than three times a week  Frequency of Social Gatherings with Friends and Family: More than three times a week  Attends Religious Services: More than 4 times per year  Active Member of [employer] or Organizations: Yes  Attends Banker Meetings: More than 4 times per year  Marital Status: Married  Housing Stability: Unknown (01/24/2023)  Received from [facility]  Housing Stability Vital Sign  Unable to Pay for Housing in the Last Year: No  Homeless in the Last Year: No   Objective:   Vitals:  06/15/23 1225  BP: (!) 148/85  Pulse: 68  Resp: 17  Temp: 37.2 C (98.9 F)  Weight: 82.1 kg (180 lb 14.4 oz)  Height: 157.5 cm (5\' 2" )   Body mass index is 33.09 kg/m.  Gen: No acute distress. Well nourished and well groomed.  Neurological: Alert and oriented to person, place, and time. Coordination normal.  Head: Normocephalic and atraumatic.  Eyes: Conjunctivae are normal. Pupils are equal, round, and reactive to light. No scleral icterus.  Neck: Normal range of motion. Neck supple. No tracheal deviation or thyromegaly present.  Cardiovascular: Normal rate, regular rhythm, normal heart sounds and intact distal pulses. Exam reveals no gallop and no  friction rub. No murmur heard. Breast: Breasts are ptotic bilaterally. No palpable mass either breast. No nipple retraction or nipple discharge. No skin dimpling. No contour abnormalities. No lymphadenopathy. No significant size discrepancy. No appreciable bruising Respiratory: Effort  normal. No respiratory distress. No chest wall tenderness. Breath sounds normal. No wheezes, rales or rhonchi.  GI: Soft. Bowel sounds are normal. The abdomen is soft and nontender. There is no rebound and no guarding.  Musculoskeletal: Normal range of motion. Extremities are nontender.  Lymphadenopathy: No cervical, preauricular, postauricular or axillary adenopathy is present Skin: Skin is warm and dry. No rash noted. No diaphoresis. No erythema. No pallor. No clubbing, cyanosis, or edema.  Psychiatric: Normal mood and affect. Behavior is normal. Judgment and thought content normal.   Labs None available at this point  Assessment and Plan:   ICD-10-CM  1. Malignant neoplasm of upper-outer quadrant of left breast in female, estrogen receptor positive (CMS/HHS-HCC) C50.412  Z17.0    Patient has a new diagnosis of clinical T1c N0 M+ left breast cancer. This is amenable to breast conservation. We will recommend genetic testing, breast conservation surgery, Oncotype, adjuvant radiation and antihormonal treatment.  We will start with surgical treatment. Again barring some on usual genetic finding, we will plan seed localized lumpectomy with sentinel lymph node biopsy. I discussed the procedure of surgery with the patient including seed placement and sentinel lymph node injection. I discussed the block. I reviewed risk of bleeding, infection, pain, dissatisfaction with appearance or change in breast contour or size. I discussed risk of breast or arm lymphedema and seroma. I reviewed risk of heart or lung complications or blood clot. Discussed risk of pain. We will plan this the first available opportunity after her vacation. I discussed postoperative lifting restrictions.  Patient's films were reviewed in multidisciplinary fashion.   Deliliah Fender, MD FACS Surgical Oncology, General Surgery, Trauma and Critical Care [facility]

## 2023-07-12 NOTE — Interval H&P Note (Signed)
 History and Physical Interval Note:  07/12/2023 11:23 AM  Amber Stephens  has presented today for surgery, with the diagnosis of LEFT BREAST CANCER.  The various methods of treatment have been discussed with the patient and family. After consideration of risks, benefits and other options for treatment, the patient has consented to  Procedure(s) with comments: BREAST LUMPECTOMY WITH RADIOACTIVE SEED AND SENTINEL LYMPH NODE BIOPSY (Left) - LEFT BREAST SEED LUMPECTOMY WITH SENTINAL NODE BIOPSY as a surgical intervention.  The patient's history has been reviewed, patient examined, no change in status, stable for surgery.  I have reviewed the patient's chart and labs.  Questions were answered to the patient's satisfaction.     Lockie Rima

## 2023-07-12 NOTE — Progress Notes (Signed)
Assisted Dr. Gavin Potters with left, pectoralis, ultrasound guided block. Side rails up, monitors on throughout procedure. See vital signs in flow sheet. Tolerated Procedure well.

## 2023-07-12 NOTE — Anesthesia Preprocedure Evaluation (Signed)
 Anesthesia Evaluation  Patient identified by MRN, date of birth, ID band Patient awake    Reviewed: Allergy & Precautions, NPO status , Patient's Chart, lab work & pertinent test results  Airway Mallampati: II  TM Distance: >3 FB Neck ROM: Full    Dental no notable dental hx.    Pulmonary asthma    Pulmonary exam normal        Cardiovascular hypertension, Pt. on medications  Rhythm:Regular Rate:Normal     Neuro/Psych negative neurological ROS  negative psych ROS   GI/Hepatic negative GI ROS, Neg liver ROS,,,  Endo/Other  negative endocrine ROS    Renal/GU negative Renal ROS  negative genitourinary   Musculoskeletal Breast Ca   Abdominal Normal abdominal exam  (+)   Peds  Hematology  (+) Blood dyscrasia, anemia Lab Results      Component                Value               Date                      WBC                      6.2                 06/15/2023                HGB                      13.7                06/15/2023                HCT                      40.8                06/15/2023                MCV                      86.8                06/15/2023                PLT                      357                 06/15/2023              Anesthesia Other Findings   Reproductive/Obstetrics                             Anesthesia Physical Anesthesia Plan  ASA: 2  Anesthesia Plan: General and Regional   Post-op Pain Management: Regional block*   Induction: Intravenous  PONV Risk Score and Plan: 3 and Ondansetron , Dexamethasone , Midazolam  and Treatment may vary due to age or medical condition  Airway Management Planned: Mask and LMA  Additional Equipment: None  Intra-op Plan:   Post-operative Plan: Extubation in OR  Informed Consent: I have reviewed the patients History and Physical, chart, labs and discussed the procedure including the risks, benefits and alternatives for  the proposed anesthesia with the patient or  authorized representative who has indicated his/her understanding and acceptance.     Dental advisory given  Plan Discussed with: CRNA  Anesthesia Plan Comments:        Anesthesia Quick Evaluation

## 2023-07-12 NOTE — Transfer of Care (Signed)
 Immediate Anesthesia Transfer of Care Note  Patient: Amber Stephens  Procedure(s) Performed: RADIOACTIVE SEED GUIDED LEFT BREAST LUMPECTOMY WITH LEFT AXILLARY SENTINEL LYMPH NODE BIOPSY (Left: Breast)  Patient Location: PACU  Anesthesia Type:General  Level of Consciousness: awake and drowsy  Airway & Oxygen Therapy: Patient Spontanous Breathing and Patient connected to face mask oxygen  Post-op Assessment: Report given to RN and Post -op Vital signs reviewed and stable  Post vital signs: Reviewed and stable  Last Vitals:  Vitals Value Taken Time  BP    Temp    Pulse 93 07/12/23 1402  Resp 15 07/12/23 1402  SpO2 94 % 07/12/23 1402  Vitals shown include unfiled device data.  Last Pain:  Vitals:   07/12/23 0913  TempSrc: Temporal  PainSc: 0-No pain      Patients Stated Pain Goal: 4 (07/12/23 0913)  Complications: No notable events documented.

## 2023-07-12 NOTE — Op Note (Signed)
 Left Breast Radioactive seed localized lumpectomy and sentinel lymph node biopsy  Indications: This patient presents with history of leftt breast cancer, cT1cN0 grade 2 invasive ductal carcinoma, upper outer quadrant, ER+/PR+/Her2-  Pre-operative Diagnosis: left breast cancer  Post-operative Diagnosis: left breast cancer  Surgeon: Lockie Rima   Assistant: n/a  Anesthesia: General endotracheal anesthesia  ASA Class: 2  Procedure Details  The patient was seen in the Holding Room. The risks, benefits, complications, treatment options, and expected outcomes were discussed with the patient. The possibilities of bleeding, infection, the need for additional procedures, failure to diagnose a condition, and creating a complication requiring transfusion or operation were discussed with the patient. The patient concurred with the proposed plan, giving informed consent.  The site of surgery properly noted/marked. The patient was taken to Operating Room # 8, identified, and the procedure verified as Left Breast Seed localized Lumpectomy with sentinel lymph node biopsy. The left arm, breast, and chest were prepped and draped in standard fashion. A Time Out was held and the above information confirmed. The MagTrace was injected into the subareolar position.  The lumpectomy was performed by creating a superior circumareolar incision near the previously placed radioactive seed.  Dissection was carried down to around the point of maximum signal intensity. The cautery was used to perform the dissection.  Hemostasis was achieved with cautery.   The specimen was inked with the margin marker paint kit.    Specimen radiography confirmed inclusion of the mammographic lesion, the clip, and the seed.  The background signal in the breast was zero.  After evaluating the 3D specimen mammogram, additional margins were taken at the inferior, lateral, and anterior margin(s). The wound was irrigated and reinspected for  hemostasis.   The edges of the cavity were marked with large clips.The breast tissue was rearranged and mastopexy sutures were placed to minimize the defect.  The skin was then closed with 3-0 vicryl in layers and 4-0 monocryl subcuticular suture.    Using a Sentimag probe, left axillary sentinel nodes were identified transcutaneously.  An oblique incision was created below the axillary hairline.  Dissection was carried through the clavipectoral fascia.  Two deep level two axillary sentinel nodes were removed.  Counts per second were 1100 and 150.    The background count was 20 cps.  The wound was irrigated.  Hemostasis was achieved with cautery.  The axillary incision was closed with a 3-0 vicryl deep dermal interrupted sutures and a 4-0 monocryl subcuticular closure.    Sterile dressings were applied. At the end of the operation, all sponge, instrument, and needle counts were correct.  Findings: grossly clear surgical margins and no adenopathy, anterior margin is skin, posterior margin is pectoralis  Estimated Blood Loss:  min         Specimens: left breast tissue with seed, additional inferior, lateral, and anterior margins, and two left axillary sentinel lymph nodes.             Complications:  None; patient tolerated the procedure well.         Disposition: PACU - hemodynamically stable.         Condition: stable

## 2023-07-12 NOTE — Anesthesia Postprocedure Evaluation (Signed)
 Anesthesia Post Note  Patient: Amber Stephens  Procedure(s) Performed: RADIOACTIVE SEED GUIDED LEFT BREAST LUMPECTOMY WITH LEFT AXILLARY SENTINEL LYMPH NODE BIOPSY (Left: Breast)     Patient location during evaluation: PACU Anesthesia Type: General Level of consciousness: awake and alert and oriented Pain management: pain level controlled Vital Signs Assessment: post-procedure vital signs reviewed and stable Respiratory status: spontaneous breathing, nonlabored ventilation and respiratory function stable Cardiovascular status: blood pressure returned to baseline and stable Postop Assessment: no apparent nausea or vomiting Anesthetic complications: no   No notable events documented.  Last Vitals:  Vitals:   07/12/23 1415 07/12/23 1430  BP: 139/87 (!) 114/94  Pulse: 78 74  Resp: 12 13  Temp:    SpO2: 98% 96%    Last Pain:  Vitals:   07/12/23 1429  TempSrc:   PainSc: 6                  Xavier Fournier A.

## 2023-07-12 NOTE — Anesthesia Procedure Notes (Signed)
 Anesthesia Regional Block: Pectoralis block   Pre-Anesthetic Checklist: , timeout performed,  Correct Patient, Correct Site, Correct Laterality,  Correct Procedure, Correct Position, site marked,  Risks and benefits discussed,  Surgical consent,  Pre-op evaluation,  At surgeon's request and post-op pain management  Laterality: Left  Prep: Dura Prep       Needles:  Injection technique: Single-shot  Needle Type: Echogenic Stimulator Needle     Needle Length: 5cm  Needle Gauge: 20     Additional Needles:   Procedures:,,,, ultrasound used (permanent image in chart),,    Narrative:  Start time: 07/12/2023 11:40 AM End time: 07/12/2023 11:45 AM Injection made incrementally with aspirations every 5 mL.  Performed by: Personally  Anesthesiologist: Micheal Agent, DO  Additional Notes: Patient identified. Risks/Benefits/Options discussed with patient including but not limited to bleeding, infection, nerve damage, failed block, incomplete pain control. Patient expressed understanding and wished to proceed. All questions were answered. Sterile technique was used throughout the entire procedure. Please see nursing notes for vital signs. Aspirated in 5cc intervals with injection for negative confirmation. Patient was given instructions on fall risk and not to get out of bed. All questions and concerns addressed with instructions to call with any issues or inadequate analgesia.

## 2023-07-13 ENCOUNTER — Encounter (HOSPITAL_BASED_OUTPATIENT_CLINIC_OR_DEPARTMENT_OTHER): Payer: Self-pay | Admitting: General Surgery

## 2023-07-13 LAB — SURGICAL PATHOLOGY

## 2023-07-13 NOTE — Anesthesia Postprocedure Evaluation (Signed)
 Anesthesia Post Note  Patient: Amber Stephens  Procedure(s) Performed: RADIOACTIVE SEED GUIDED LEFT BREAST LUMPECTOMY WITH LEFT AXILLARY SENTINEL LYMPH NODE BIOPSY (Left: Breast)     Patient location during evaluation: PACU Anesthesia Type: General and Regional Level of consciousness: awake and alert Pain management: pain level controlled Vital Signs Assessment: post-procedure vital signs reviewed and stable Respiratory status: spontaneous breathing, nonlabored ventilation, respiratory function stable and patient connected to nasal cannula oxygen Cardiovascular status: blood pressure returned to baseline and stable Postop Assessment: no apparent nausea or vomiting Anesthetic complications: no   No notable events documented.  Last Vitals:  Vitals:   07/12/23 1500 07/12/23 1548  BP: 127/85 (!) 142/85  Pulse: 65 65  Resp: 12 12  Temp:  36.8 C  SpO2: 95% 97%    Last Pain:  Vitals:   07/12/23 1548  TempSrc:   PainSc: 3                  Dawnelle Warman P Bolivar Koranda

## 2023-07-14 ENCOUNTER — Ambulatory Visit: Payer: Self-pay | Admitting: General Surgery

## 2023-07-14 ENCOUNTER — Other Ambulatory Visit: Payer: Self-pay | Admitting: General Surgery

## 2023-07-14 NOTE — Telephone Encounter (Signed)
 Discussed path with patient. Focally positive inferior margin. Plan reexcision. Posted, orders written.

## 2023-07-15 ENCOUNTER — Encounter: Payer: Self-pay | Admitting: *Deleted

## 2023-07-15 ENCOUNTER — Encounter (HOSPITAL_BASED_OUTPATIENT_CLINIC_OR_DEPARTMENT_OTHER): Payer: Self-pay | Admitting: General Surgery

## 2023-07-15 ENCOUNTER — Telehealth: Payer: Self-pay | Admitting: *Deleted

## 2023-07-15 NOTE — Telephone Encounter (Signed)
 Received order for Oncotype Testing per Dr. Pamelia Hoit. Requisition faxed to pathology and [facility]

## 2023-07-20 ENCOUNTER — Other Ambulatory Visit: Payer: Self-pay

## 2023-07-20 ENCOUNTER — Encounter (HOSPITAL_BASED_OUTPATIENT_CLINIC_OR_DEPARTMENT_OTHER)
Admission: RE | Disposition: A | Discharge status: Home / Self Care | Payer: Self-pay | Source: Home / Self Care | Attending: General Surgery

## 2023-07-20 ENCOUNTER — Ambulatory Visit (HOSPITAL_BASED_OUTPATIENT_CLINIC_OR_DEPARTMENT_OTHER): Admitting: Anesthesiology

## 2023-07-20 ENCOUNTER — Ambulatory Visit (HOSPITAL_BASED_OUTPATIENT_CLINIC_OR_DEPARTMENT_OTHER)
Admission: RE | Admit: 2023-07-20 | Discharge: 2023-07-20 | Disposition: A | Discharge status: Home / Self Care | Attending: General Surgery | Admitting: General Surgery

## 2023-07-20 ENCOUNTER — Encounter (HOSPITAL_BASED_OUTPATIENT_CLINIC_OR_DEPARTMENT_OTHER): Payer: Self-pay | Admitting: General Surgery

## 2023-07-20 ENCOUNTER — Telehealth: Payer: Self-pay | Admitting: Hematology and Oncology

## 2023-07-20 DIAGNOSIS — C50412 Malignant neoplasm of upper-outer quadrant of left female breast: Secondary | ICD-10-CM

## 2023-07-20 DIAGNOSIS — E669 Obesity, unspecified: Secondary | ICD-10-CM | POA: Insufficient documentation

## 2023-07-20 DIAGNOSIS — Z6833 Body mass index (BMI) 33.0-33.9, adult: Secondary | ICD-10-CM | POA: Insufficient documentation

## 2023-07-20 DIAGNOSIS — Z1732 Human epidermal growth factor receptor 2 negative status: Secondary | ICD-10-CM | POA: Insufficient documentation

## 2023-07-20 DIAGNOSIS — N6022 Fibroadenosis of left breast: Secondary | ICD-10-CM | POA: Diagnosis not present

## 2023-07-20 DIAGNOSIS — Z803 Family history of malignant neoplasm of breast: Secondary | ICD-10-CM | POA: Diagnosis not present

## 2023-07-20 DIAGNOSIS — I10 Essential (primary) hypertension: Secondary | ICD-10-CM

## 2023-07-20 DIAGNOSIS — J45909 Unspecified asthma, uncomplicated: Secondary | ICD-10-CM

## 2023-07-20 DIAGNOSIS — Z170 Estrogen receptor positive status [ER+]: Secondary | ICD-10-CM

## 2023-07-20 DIAGNOSIS — Z800 Family history of malignant neoplasm of digestive organs: Secondary | ICD-10-CM | POA: Insufficient documentation

## 2023-07-20 DIAGNOSIS — Z79899 Other long term (current) drug therapy: Secondary | ICD-10-CM | POA: Diagnosis not present

## 2023-07-20 HISTORY — DX: Malignant neoplasm of unspecified site of unspecified female breast: C50.919

## 2023-07-20 HISTORY — PX: BREAST LUMPECTOMY: SHX2

## 2023-07-20 HISTORY — DX: Allergy, unspecified, initial encounter: T78.40XA

## 2023-07-20 SURGERY — BREAST LUMPECTOMY
Anesthesia: General | Site: Breast | Laterality: Left

## 2023-07-20 MED ORDER — GLYCOPYRROLATE 0.2 MG/ML IJ SOLN
INTRAMUSCULAR | Status: DC | PRN
  Administered 2023-07-20: .1 mg via INTRAVENOUS

## 2023-07-20 MED ORDER — ONDANSETRON HCL 4 MG/2ML IJ SOLN
INTRAMUSCULAR | Status: DC | PRN
Start: 2023-07-20 — End: 2023-07-20
  Administered 2023-07-20: 4 mg via INTRAVENOUS

## 2023-07-20 MED ORDER — FENTANYL CITRATE (PF) 100 MCG/2ML IJ SOLN
25.0000 ug | INTRAMUSCULAR | Status: DC | PRN
  Administered 2023-07-20: 50 ug via INTRAVENOUS

## 2023-07-20 MED ORDER — OXYCODONE HCL 5 MG PO TABS
5.0000 mg | ORAL_TABLET | Freq: Once | ORAL | Status: AC | PRN
  Administered 2023-07-20: 5 mg via ORAL

## 2023-07-20 MED ORDER — FENTANYL CITRATE (PF) 100 MCG/2ML IJ SOLN
INTRAMUSCULAR | Status: AC
Start: 2023-07-20 — End: 2023-07-20
  Filled 2023-07-20: qty 2

## 2023-07-20 MED ORDER — MIDAZOLAM HCL 2 MG/2ML IJ SOLN
INTRAMUSCULAR | Status: DC | PRN
  Administered 2023-07-20: 2 mg via INTRAVENOUS

## 2023-07-20 MED ORDER — PROPOFOL 10 MG/ML IV BOLUS
INTRAVENOUS | Status: DC | PRN
Start: 2023-07-20 — End: 2023-07-20
  Administered 2023-07-20: 150 mg via INTRAVENOUS

## 2023-07-20 MED ORDER — DEXAMETHASONE SODIUM PHOSPHATE 4 MG/ML IJ SOLN
INTRAMUSCULAR | Status: DC | PRN
  Administered 2023-07-20: 8 mg via INTRAVENOUS

## 2023-07-20 MED ORDER — ACETAMINOPHEN 500 MG PO TABS
1000.0000 mg | ORAL_TABLET | ORAL | Status: AC
  Administered 2023-07-20: 1000 mg via ORAL

## 2023-07-20 MED ORDER — MIDAZOLAM HCL 2 MG/2ML IJ SOLN
INTRAMUSCULAR | Status: AC
  Filled 2023-07-20: qty 2

## 2023-07-20 MED ORDER — LIDOCAINE HCL 1 % IJ SOLN
INTRAMUSCULAR | Status: DC | PRN
  Administered 2023-07-20: 60 mL via INTRAMUSCULAR

## 2023-07-20 MED ORDER — FENTANYL CITRATE (PF) 100 MCG/2ML IJ SOLN
INTRAMUSCULAR | Status: DC | PRN
  Administered 2023-07-20: 50 ug via INTRAVENOUS
  Administered 2023-07-20: 25 ug via INTRAVENOUS

## 2023-07-20 MED ORDER — OXYCODONE HCL 5 MG PO TABS
ORAL_TABLET | ORAL | Status: AC
  Filled 2023-07-20: qty 1

## 2023-07-20 MED ORDER — LACTATED RINGERS IV SOLN: INTRAVENOUS | Status: DC

## 2023-07-20 MED ORDER — KETOROLAC TROMETHAMINE 30 MG/ML IJ SOLN: 30.0000 mg | Freq: Once | INTRAMUSCULAR | Status: DC | PRN

## 2023-07-20 MED ORDER — ACETAMINOPHEN 500 MG PO TABS
ORAL_TABLET | ORAL | Status: AC
Start: 2023-07-20 — End: 2023-07-20
  Filled 2023-07-20: qty 2

## 2023-07-20 MED ORDER — PHENYLEPHRINE HCL (PRESSORS) 10 MG/ML IV SOLN
INTRAVENOUS | Status: DC | PRN
  Administered 2023-07-20: 80 ug via INTRAVENOUS

## 2023-07-20 MED ORDER — CEFAZOLIN SODIUM-DEXTROSE 2-4 GM/100ML-% IV SOLN
INTRAVENOUS | Status: AC
Start: 2023-07-20 — End: 2023-07-20
  Filled 2023-07-20: qty 100

## 2023-07-20 MED ORDER — LIDOCAINE HCL (PF) 1 % IJ SOLN
INTRAMUSCULAR | Status: AC
Start: 2023-07-20 — End: 2023-07-20
  Filled 2023-07-20: qty 30

## 2023-07-20 MED ORDER — AMISULPRIDE (ANTIEMETIC) 5 MG/2ML IV SOLN: 10.0000 mg | Freq: Once | INTRAVENOUS | Status: DC | PRN

## 2023-07-20 MED ORDER — BUPIVACAINE-EPINEPHRINE (PF) 0.25% -1:200000 IJ SOLN
INTRAMUSCULAR | Status: AC
  Filled 2023-07-20: qty 30

## 2023-07-20 MED ORDER — LIDOCAINE HCL (CARDIAC) PF 100 MG/5ML IV SOSY
PREFILLED_SYRINGE | INTRAVENOUS | Status: DC | PRN
  Administered 2023-07-20: 60 mg via INTRAVENOUS

## 2023-07-20 MED ORDER — CHLORHEXIDINE GLUCONATE CLOTH 2 % EX PADS
6.0000 | MEDICATED_PAD | Freq: Once | CUTANEOUS | Status: AC
Start: 2023-07-20 — End: 2023-07-20
  Administered 2023-07-20: 6 via TOPICAL

## 2023-07-20 MED ORDER — CEFAZOLIN SODIUM-DEXTROSE 2-4 GM/100ML-% IV SOLN
2.0000 g | INTRAVENOUS | Status: AC
  Administered 2023-07-20: 2 g via INTRAVENOUS

## 2023-07-20 MED ORDER — CHLORHEXIDINE GLUCONATE CLOTH 2 % EX PADS: 6.0000 | MEDICATED_PAD | Freq: Once | CUTANEOUS | Status: DC

## 2023-07-20 MED ORDER — DEXMEDETOMIDINE HCL IN NACL 80 MCG/20ML IV SOLN
INTRAVENOUS | Status: DC | PRN
  Administered 2023-07-20: 4 ug via INTRAVENOUS

## 2023-07-20 MED ORDER — FENTANYL CITRATE (PF) 100 MCG/2ML IJ SOLN
INTRAMUSCULAR | Status: AC
  Filled 2023-07-20: qty 2

## 2023-07-20 MED ORDER — EPHEDRINE SULFATE (PRESSORS) 50 MG/ML IJ SOLN
INTRAMUSCULAR | Status: DC | PRN
  Administered 2023-07-20: 5 mg via INTRAVENOUS

## 2023-07-20 MED ORDER — OXYCODONE HCL 5 MG/5ML PO SOLN: 5.0000 mg | Freq: Once | ORAL | Status: AC | PRN

## 2023-07-20 SURGICAL SUPPLY — 38 items
BLADE SURG 15 STRL LF DISP TIS (BLADE) ×1 IMPLANT
CANISTER SUCT 1200ML W/VALVE (MISCELLANEOUS) ×1 IMPLANT
CHLORAPREP W/TINT 26 (MISCELLANEOUS) ×1 IMPLANT
CLIP TI LARGE 6 (CLIP) IMPLANT
COVER BACK TABLE 60X90IN (DRAPES) ×1 IMPLANT
COVER MAYO STAND STRL (DRAPES) ×1 IMPLANT
DRAPE LAPAROTOMY 100X72 PEDS (DRAPES) ×1 IMPLANT
DRAPE UTILITY XL STRL (DRAPES) ×2 IMPLANT
ELECT COATED BLADE 2.86 ST (ELECTRODE) ×1 IMPLANT
ELECTRODE REM PT RTRN 9FT ADLT (ELECTROSURGICAL) ×1 IMPLANT
GAUZE PAD ABD 8X10 STRL (GAUZE/BANDAGES/DRESSINGS) IMPLANT
GAUZE SPONGE 4X4 12PLY STRL LF (GAUZE/BANDAGES/DRESSINGS) ×1 IMPLANT
GLOVE BIO SURGEON STRL SZ 6 (GLOVE) ×1 IMPLANT
GLOVE BIOGEL PI IND STRL 6.5 (GLOVE) ×1 IMPLANT
GOWN STRL REUS W/ TWL LRG LVL3 (GOWN DISPOSABLE) ×1 IMPLANT
GOWN STRL REUS W/ TWL XL LVL3 (GOWN DISPOSABLE) ×1 IMPLANT
KIT MARKER MARGIN INK (KITS) IMPLANT
NDL HYPO 25X1 1.5 SAFETY (NEEDLE) ×1 IMPLANT
NEEDLE HYPO 25X1 1.5 SAFETY (NEEDLE) ×1 IMPLANT
NS IRRIG 1000ML POUR BTL (IV SOLUTION) ×1 IMPLANT
PACK BASIN DAY SURGERY FS (CUSTOM PROCEDURE TRAY) ×1 IMPLANT
PENCIL SMOKE EVACUATOR (MISCELLANEOUS) ×1 IMPLANT
SLEEVE SCD COMPRESS KNEE MED (STOCKING) ×1 IMPLANT
SPIKE FLUID TRANSFER (MISCELLANEOUS) IMPLANT
SPONGE T-LAP 18X18 ~~LOC~~+RFID (SPONGE) ×1 IMPLANT
STAPLER SKIN PROX WIDE 3.9 (STAPLE) IMPLANT
STRIP CLOSURE SKIN 1/2X4 (GAUZE/BANDAGES/DRESSINGS) ×1 IMPLANT
SUT MON AB 4-0 PC3 18 (SUTURE) ×1 IMPLANT
SUT SILK 2 0 SH (SUTURE) IMPLANT
SUT VIC AB 2-0 SH 27XBRD (SUTURE) IMPLANT
SUT VIC AB 3-0 54X BRD REEL (SUTURE) IMPLANT
SUT VIC AB 3-0 SH 27X BRD (SUTURE) ×1 IMPLANT
SYR BULB EAR ULCER 3OZ GRN STR (SYRINGE) ×1 IMPLANT
SYR CONTROL 10ML LL (SYRINGE) ×1 IMPLANT
TOWEL GREEN STERILE FF (TOWEL DISPOSABLE) ×1 IMPLANT
TRAY FAXITRON CT DISP (TRAY / TRAY PROCEDURE) IMPLANT
TUBE CONNECTING 20X1/4 (TUBING) ×1 IMPLANT
YANKAUER SUCT BULB TIP NO VENT (SUCTIONS) ×1 IMPLANT

## 2023-07-20 NOTE — Transfer of Care (Signed)
 Immediate Anesthesia Transfer of Care Note  Patient: Amber Stephens  Procedure(s) Performed: BREAST LUMPECTOMY (Left: Breast)  Patient Location: PACU  Anesthesia Type:General  Level of Consciousness: awake, alert , and oriented  Airway & Oxygen Therapy: Patient Spontanous Breathing and Patient connected to nasal cannula oxygen  Post-op Assessment: Report given to RN and Post -op Vital signs reviewed and stable  Post vital signs: Reviewed and stable  Last Vitals:  Vitals Value Taken Time  BP    Temp    Pulse    Resp    SpO2      Last Pain:  Vitals:   07/20/23 1002  TempSrc:   PainSc: 0-No pain      Patients Stated Pain Goal: 3 (07/20/23 0956)  Complications: No notable events documented.

## 2023-07-20 NOTE — Discharge Instructions (Addendum)
[place] Office Phone Number [phone]  BREAST BIOPSY/ PARTIAL MASTECTOMY: POST OP INSTRUCTIONS  Always review your discharge instruction sheet given to you by the facility where your surgery was performed.  IF YOU HAVE DISABILITY OR FAMILY LEAVE FORMS, YOU MUST BRING THEM TO THE OFFICE FOR PROCESSING.  DO NOT GIVE THEM TO YOUR DOCTOR.  Take 2 tylenol (acetominophen) three times a day for 3 days.  If you still have pain, add ibuprofen with food in between if able to take this (if you have kidney issues or stomach issues, do not take ibuprofen).  If both of those are not enough, add the narcotic pain pill.  If you find you are needing a lot of this overnight after surgery, call the next morning for a refill.    Prescriptions will not be filled after 5pm or on week-ends. Take your usually prescribed medications unless otherwise directed You should eat very light the first 24 hours after surgery, such as soup, crackers, pudding, etc.  Resume your normal diet the day after surgery. Most patients will experience some swelling and bruising in the breast.  Ice packs and a good support bra will help.  Swelling and bruising can take several days to resolve.  It is common to experience some constipation if taking pain medication after surgery.  Increasing fluid intake and taking a stool softener will usually help or prevent this problem from occurring.  A mild laxative (Milk of Magnesia or Miralax) should be taken according to package directions if there are no bowel movements after 48 hours. Unless discharge instructions indicate otherwise, you may remove your bandages 48 hours after surgery, and you may shower at that time.  You may have steri-strips (small skin tapes) in place directly over the incision.  These strips should be left on the skin at least for for 7-10 days.    ACTIVITIES:  You may resume regular daily activities (gradually increasing) beginning the next day.  Wearing a  good support bra or sports bra (or the breast binder) minimizes pain and swelling.  You may have sexual intercourse when it is comfortable. No heavy lifting for 1-2 weeks (not over around 10 pounds).  You may drive when you no longer are taking prescription pain medication, you can comfortably wear a seatbelt, and you can safely maneuver your car and apply brakes. RETURN TO WORK:  __________3-14 days depending on job. _______________ Dennis Bast should see your doctor in the office for a follow-up appointment approximately two weeks after your surgery.  Your doctor's nurse will typically make your follow-up appointment when she calls you with your pathology report.  Expect your pathology report 3-4 business days after your surgery.  You may call to check if you do not hear from [place] after three days.   WHEN TO CALL YOUR DOCTOR: Fever over 101.0 Nausea and/or vomiting. Extreme swelling or bruising. Continued bleeding from incision. Increased pain, redness, or drainage from the incision.  The clinic staff is available to answer your questions during regular business hours.  Please don't hesitate to call and ask to speak to one of the nurses for clinical concerns.  If you have a medical emergency, go to the nearest emergency room or call 911.  A surgeon from [facility] is always on call at the hospital.  For further questions, please visit [email or web address]    Post Anesthesia Home Care Instructions  Activity: Get plenty of rest for the remainder of the day. A responsible individual must stay  with you for 24 hours following the procedure.  For the next 24 hours, DO NOT: -Drive a car -Paediatric nurse -Drink alcoholic beverages -Take any medication unless instructed by your physician -Make any legal decisions or sign important papers.  Meals: Start with liquid foods such as gelatin or soup. Progress to regular foods as tolerated. Avoid greasy, spicy, heavy foods. If nausea  and/or vomiting occur, drink only clear liquids until the nausea and/or vomiting subsides. Call your physician if vomiting continues.  Special Instructions/Symptoms: Your throat may feel dry or sore from the anesthesia or the breathing tube placed in your throat during surgery. If this causes discomfort, gargle with warm salt water. The discomfort should disappear within 24 hours.  If you had a scopolamine patch placed behind your ear for the management of post- operative nausea and/or vomiting:  1. The medication in the patch is effective for 72 hours, after which it should be removed.  Wrap patch in a tissue and discard in the trash. Wash hands thoroughly with soap and water. 2. You may remove the patch earlier than 72 hours if you experience unpleasant side effects which may include dry mouth, dizziness or visual disturbances. 3. Avoid touching the patch. Wash your hands with soap and water after contact with the patch.

## 2023-07-20 NOTE — Anesthesia Procedure Notes (Signed)
 Procedure Name: LMA Insertion Date/Time: 07/20/2023 11:48 AM  Performed by: Lonia Ro, CRNAPre-anesthesia Checklist: Patient identified, Emergency Drugs available, Suction available, Patient being monitored and Timeout performed Patient Re-evaluated:Patient Re-evaluated prior to induction Oxygen Delivery Method: Circle system utilized Preoxygenation: Pre-oxygenation with 100% oxygen Induction Type: IV induction Ventilation: Mask ventilation without difficulty LMA: LMA inserted LMA Size: 4.0 Number of attempts: 1 Placement Confirmation: positive ETCO2 and breath sounds checked- equal and bilateral Tube secured with: Tape Dental Injury: Teeth and Oropharynx as per pre-operative assessment

## 2023-07-20 NOTE — Telephone Encounter (Signed)
 left vm for pt to call back to reschedule appt

## 2023-07-20 NOTE — Anesthesia Postprocedure Evaluation (Signed)
 Anesthesia Post Note  Patient: Amber Stephens  Procedure(s) Performed: BREAST LUMPECTOMY (Left: Breast)     Patient location during evaluation: PACU Anesthesia Type: General Level of consciousness: awake Pain management: pain level controlled Vital Signs Assessment: post-procedure vital signs reviewed and stable Respiratory status: spontaneous breathing, nonlabored ventilation and respiratory function stable Cardiovascular status: blood pressure returned to baseline and stable Postop Assessment: no apparent nausea or vomiting Anesthetic complications: no   No notable events documented.  Last Vitals:  Vitals:   07/20/23 1330 07/20/23 1353  BP: 112/77 126/87  Pulse: (!) 55 63  Resp: 12 16  Temp:  (!) 36.3 C  SpO2: 96% 98%    Last Pain:  Vitals:   07/20/23 1349  TempSrc:   PainSc: 4                  Yoshito Gaza P Abdishakur Gottschall

## 2023-07-20 NOTE — Progress Notes (Signed)
 Last time received oxycodone  written on dc paperwork, no tylenol  until 4pm written on dc paperwork.

## 2023-07-20 NOTE — Anesthesia Preprocedure Evaluation (Addendum)
 Anesthesia Evaluation  Patient identified by MRN, date of birth, ID band Patient awake    Reviewed: Allergy & Precautions, NPO status , Patient's Chart, lab work & pertinent test results  Airway Mallampati: II  TM Distance: >3 FB Neck ROM: Full    Dental no notable dental hx.    Pulmonary asthma    Pulmonary exam normal        Cardiovascular hypertension, Pt. on medications Normal cardiovascular exam     Neuro/Psych negative neurological ROS  negative psych ROS   GI/Hepatic negative GI ROS, Neg liver ROS,,,  Endo/Other  negative endocrine ROS    Renal/GU negative Renal ROS     Musculoskeletal negative musculoskeletal ROS (+)    Abdominal  (+) + obese  Peds  Hematology negative hematology ROS (+)   Anesthesia Other Findings LEFT BREAST CANCER  Reproductive/Obstetrics S/p hysterectomy                             Anesthesia Physical Anesthesia Plan  ASA: 3  Anesthesia Plan: General   Post-op Pain Management:    Induction: Intravenous  PONV Risk Score and Plan: 3 and Ondansetron , Dexamethasone , Midazolam  and Treatment may vary due to age or medical condition  Airway Management Planned: LMA  Additional Equipment:   Intra-op Plan:   Post-operative Plan: Extubation in OR  Informed Consent: I have reviewed the patients History and Physical, chart, labs and discussed the procedure including the risks, benefits and alternatives for the proposed anesthesia with the patient or authorized representative who has indicated his/her understanding and acceptance.     Dental advisory given  Plan Discussed with: CRNA  Anesthesia Plan Comments:        Anesthesia Quick Evaluation

## 2023-07-20 NOTE — Op Note (Signed)
 Re-excisional left Breast Lumpectomy   Indications: This patient presents with history of positive inferior margins after lumpectomy for left breast cancer   Pre-operative Diagnosis: left breast cancer   Post-operative Diagnosis: Same  Surgeon: Lockie Rima   Assistants: n/a   Anesthesia: General anesthesia and Local anesthesia   ASA Class: 3   Procedure Details  The patient was seen in the Holding Room. The risks, benefits, complications, treatment options, and expected outcomes were discussed with the patient. The possibilities of reaction to medication, pulmonary aspiration, bleeding, infection, the need for additional procedures, failure to diagnose a condition, and creating a complication requiring transfusion or operation were discussed with the patient. The patient concurred with the proposed plan, giving informed consent. The site of surgery properly noted/marked. The patient was taken to Operating Room # 7, identified, and the procedure verified as re-excision of left breast cancer.  After induction of anesthesia, the left breast and chest were prepped and draped in standard fashion.  The lumpectomy was performed by reopening the prior incision. Seroma was aspirated. The mastopexy sutures were removed. Additional margins were taken at the inferior border of the lumpectomy cavity. Dissection was carried down to the pectoral fascia. Specimen was marked with the margin marker paint kit.   Hemostasis was achieved with cautery. The wound was irrigated and closed with a 3-0 Vicryl deep dermal interrupted and a 4-0 Monocryl subcuticular closure in layers.  Sterile dressings were applied. At the end of the operation, all sponge, instrument, and needle counts were correct.   Findings:  grossly clear surgical margins, anterior margin is skin, posterior margin is pectoralis  Estimated Blood Loss: Minimal   Drains: none   Specimens: additional interior margin  Complications: None;  patient tolerated the procedure well.   Disposition: PACU - hemodynamically stable.   Condition: stable

## 2023-07-20 NOTE — Interval H&P Note (Signed)
 History and Physical Interval Note:  07/20/2023 10:33 AM  Amber Stephens  has presented today for surgery, with the diagnosis of LEFT BREAST CANCER.  The various methods of treatment have been discussed with the patient and family. After consideration of risks, benefits and other options for treatment, the patient has consented to  Procedure(s) with comments: BREAST LUMPECTOMY (Left) - RE-EXCISION LUMPECTOMY LEFT BREAST as a surgical intervention for a positive inferior margin.  The patient's history has been reviewed, patient examined, no change in status, stable for surgery.  I have reviewed the patient's chart and labs.  Questions were answered to the patient's satisfaction.     Lockie Rima

## 2023-07-21 ENCOUNTER — Encounter (HOSPITAL_BASED_OUTPATIENT_CLINIC_OR_DEPARTMENT_OTHER): Payer: Self-pay | Admitting: General Surgery

## 2023-07-21 LAB — SURGICAL PATHOLOGY

## 2023-07-22 ENCOUNTER — Ambulatory Visit: Payer: Self-pay | Admitting: General Surgery

## 2023-07-25 ENCOUNTER — Ambulatory Visit: Admitting: Hematology and Oncology

## 2023-07-25 ENCOUNTER — Other Ambulatory Visit: Payer: Self-pay | Admitting: General Surgery

## 2023-07-25 DIAGNOSIS — Z170 Estrogen receptor positive status [ER+]: Secondary | ICD-10-CM

## 2023-07-26 ENCOUNTER — Encounter (HOSPITAL_COMMUNITY): Payer: Self-pay

## 2023-07-28 ENCOUNTER — Ambulatory Visit
Admission: RE | Admit: 2023-07-28 | Discharge: 2023-07-28 | Disposition: A | Discharge status: Home / Self Care | Source: Ambulatory Visit | Attending: General Surgery | Admitting: General Surgery

## 2023-07-28 DIAGNOSIS — C50412 Malignant neoplasm of upper-outer quadrant of left female breast: Secondary | ICD-10-CM

## 2023-07-28 MED ORDER — GADOPICLENOL 0.5 MMOL/ML IV SOLN
8.0000 mL | Freq: Once | INTRAVENOUS | Status: AC | PRN
Start: 2023-07-28 — End: 2023-07-28
  Administered 2023-07-28: 8 mL via INTRAVENOUS

## 2023-07-29 ENCOUNTER — Telehealth: Payer: Self-pay | Admitting: *Deleted

## 2023-07-29 NOTE — Telephone Encounter (Signed)
 Received oncoytpe results of 19/6%. Team notified.

## 2023-08-01 ENCOUNTER — Encounter: Payer: Self-pay | Admitting: *Deleted

## 2023-08-02 ENCOUNTER — Other Ambulatory Visit: Payer: Self-pay | Admitting: General Surgery

## 2023-08-02 ENCOUNTER — Encounter: Admitting: Family

## 2023-08-03 ENCOUNTER — Encounter: Payer: Self-pay | Admitting: Rehabilitation

## 2023-08-03 ENCOUNTER — Ambulatory Visit: Attending: General Surgery | Admitting: Rehabilitation

## 2023-08-03 DIAGNOSIS — Z170 Estrogen receptor positive status [ER+]: Secondary | ICD-10-CM | POA: Insufficient documentation

## 2023-08-03 DIAGNOSIS — R293 Abnormal posture: Secondary | ICD-10-CM | POA: Insufficient documentation

## 2023-08-03 DIAGNOSIS — Z9189 Other specified personal risk factors, not elsewhere classified: Secondary | ICD-10-CM | POA: Diagnosis present

## 2023-08-03 DIAGNOSIS — Z483 Aftercare following surgery for neoplasm: Secondary | ICD-10-CM | POA: Diagnosis present

## 2023-08-03 DIAGNOSIS — C50412 Malignant neoplasm of upper-outer quadrant of left female breast: Secondary | ICD-10-CM | POA: Insufficient documentation

## 2023-08-03 NOTE — Therapy (Signed)
 OUTPATIENT PHYSICAL THERAPY BREAST CANCER POST OP FOLLOW UP   Patient Name: Amber Stephens MRN: [id number] DOB:11-04-1975, 48 y.o., female Today's Date: 08/03/2023  END OF SESSION:  PT End of Session - 08/03/23 1142     Visit Number 2    Number of Visits 2    Date for PT Re-Evaluation 08/10/23    PT Start Time 1100    PT Stop Time 1132    PT Time Calculation (min) 32 min    Activity Tolerance Patient tolerated treatment well    Behavior During Therapy WFL for tasks assessed/performed          Past Medical History:  Diagnosis Date   Allergic rhinitis    Allergy    As a child   Anemia    borderline, prior to hysterectomy   Asthma    Breast cancer (HCC) 2025   Cancer (HCC)    Breast   Fatigue    History of pneumonia age 44 and agin   my mom said at age 35 and again at age 28    Hypertension 05/28/2020   Multiple food allergies    Shortness of breath    Shortness of breath on exertion    Past Surgical History:  Procedure Laterality Date   ABDOMINAL HYSTERECTOMY N/A 06/16/2017   Procedure: HYSTERECTOMY ABDOMINAL TOTAL;  Surgeon: Rosalynn LELON Ingle, MD;  [facility];  Service: Gynecology;  Laterality: N/A;   BILATERAL SALPINGECTOMY Bilateral 06/16/2017   Procedure: BILATERAL SALPINGECTOMY;  Surgeon: Rosalynn LELON Ingle, MD;  Location: [facility];  Service: Gynecology;  Laterality: Bilateral;   BREAST BIOPSY Left 06/06/2023   US  LT BREAST BX W LOC DEV 1ST LESION IMG BX SPEC US  GUIDE 06/06/2023 [facility]   BREAST BIOPSY  07/08/2023   MM LT RADIOACTIVE SEED LOC MAMMO GUIDE 07/08/2023 [facility]   BREAST LUMPECTOMY Left 07/20/2023   Procedure: BREAST LUMPECTOMY;  Surgeon: Aron Shoulders, MD;  Location: [facility];  Service: General;  Laterality: Left;  RE-EXCISION LUMPECTOMY LEFT BREAST   BREAST LUMPECTOMY WITH RADIOACTIVE SEED AND SENTINEL LYMPH NODE BIOPSY Left 07/12/2023   Procedure: RADIOACTIVE SEED GUIDED  LEFT BREAST LUMPECTOMY WITH LEFT AXILLARY SENTINEL LYMPH NODE BIOPSY;  Surgeon: Aron Shoulders, MD;  Location: [facility];  Service: General;  Laterality: Left;   BTL  11/2008   Dr Marget; bilateral tubal ligation    CESAREAN SECTION     x4 , with each child , has 4 children    DILATION AND CURETTAGE OF UTERUS  07/02/2012   MYOMECTOMY  2001   Dr Ingle Rosalynn at [facility]    TUBAL LIGATION  2010   Patient Active Problem List   Diagnosis Date Noted   Genetic testing 06/24/2023   Malignant neoplasm of upper-outer quadrant of left breast in female, estrogen receptor positive (HCC) 06/13/2023   Generalized obesity 03/03/2022   Abnormal craving 10/05/2021   Other hyperlipidemia 08/05/2021   Prediabetes 03/30/2021   Vitamin D  insufficiency 03/30/2021   Obesity (BMI 30.0-34.9) 12/03/2020   Hypertension 05/28/2020   S/P TAH (total abdominal hysterectomy) 06/16/2017   Physical exam, annual 10/30/2012   Allergic rhinitis 05/15/2007   Asthma 05/15/2007    REFERRING PROVIDER: Dr. Shoulders Aron  REFERRING DIAG: Left breast cancer   THERAPY DIAG:  Malignant neoplasm of upper-outer quadrant of left breast in female, estrogen receptor positive (HCC)  Abnormal posture  Aftercare following surgery for neoplasm  At risk for lymphedema  Rationale for Evaluation and Treatment: Rehabilitation  ONSET DATE: 05/18/23  SUBJECTIVE:                                                                                                                                                                                           SUBJECTIVE STATEMENT: The only thing I haven't done is lift heavy groceries but other than that.  I am back to working out on the TM.  I have a seroma in the breast and more positive margins.  Its not scheduled yet.    PERTINENT HISTORY:  Patient was diagnosed on 05/18/2023 with left grade 2 invasive ductal carcinoma breast cancer. It measured 1.5 cm and was located in  the upper outer quadrant. It is ER/PR positive and HER2 negative with a Ki67 of 1%. Lumpectomy 07/12/23 with 2 negative nodes removed.  Re-excision 07/20/23. Will have radiation.    PATIENT GOALS:  Reassess how my recovery is going related to arm function, pain, and swelling.  PAIN:  Are you having pain? Yes: NPRS scale: occasional twinges 2/10 Pain location: breast  Pain description: stabbing Aggravating factors: random Relieving factors: rest   PRECAUTIONS: Recent Surgery, left UE Lymphedema risk  RED FLAGS: None   ACTIVITY LEVEL / LEISURE: see above     OBJECTIVE:   PATIENT SURVEYS:  QUICK DASH: 6.8% From 0%  OBSERVATIONS: Still in compression, well healed incision in axilla with steristrip on the breast incision  POSTURE:  WNL  LYMPHEDEMA ASSESSMENT:   UPPER EXTREMITY AROM/PROM:   A/PROM RIGHT   eval    Shoulder extension 52  Shoulder flexion 141  Shoulder abduction 167  Shoulder internal rotation 74  Shoulder external rotation 90                          (Blank rows = not tested)   A/PROM LEFT   eval 08/03/23  Shoulder extension 53 60  Shoulder flexion 143 141  Shoulder abduction 150 152  Shoulder internal rotation 69   Shoulder external rotation 77 85                          (Blank rows = not tested)   CERVICAL AROM: All within normal limits   UPPER EXTREMITY STRENGTH: WNL   LYMPHEDEMA ASSESSMENTS (in cm):    LANDMARK RIGHT   eval  10 cm proximal to olecranon process 33.4  Olecranon process 26.7  10 cm proximal to ulnar styloid process 25  Just proximal to ulnar styloid process 14.9  Across hand at thumb web space 19.3  At base of 2nd digit 6.2  (Blank rows = not tested)   LANDMARK LEFT   eval  10 cm proximal to olecranon process 32.9  Olecranon process 26.2  10 cm proximal to ulnar styloid process 24  Just proximal to ulnar styloid process 14.9  Across hand at thumb web space 17.3  At base of 2nd digit 6  (Blank rows = not  tested)   PATIENT EDUCATION:  Education details: per post op section Person educated: Patient Education method: Explanation, Demonstration, and Handouts Education comprehension: verbalized understanding  HOME EXERCISE PROGRAM: Reviewed previously given post op HEP.   ASSESSMENT:  CLINICAL IMPRESSION: Pt is back to baseline now after surgery but has to have a 2nd re-excision and seroma removal so she will continue with compression and resume exercises again after the next surgery.  Discussed post op points per below as well return to weight lifting.    Pt will benefit from skilled therapeutic intervention to improve on the following deficits: Decreased knowledge of precautions, impaired UE functional use, pain, decreased ROM, postural dysfunction.   PT treatment/interventions: ADL/Self care home management, [phone]- Self Care   GOALS: Goals reviewed with patient? Yes  LONG TERM GOALS:  (STG=LTG)  GOALS Name Target Date  Goal status  1 Pt will demonstrate she has regained full shoulder ROM and function post operatively compared to baselines.  Baseline: 08/03/23 MET                    PLAN:  PT FREQUENCY/DURATION: SOZO  PLAN FOR NEXT SESSION: SOZO   [facility]  [address]  [place] [address]  [phone]  After Breast Cancer Class Video It is recommended you view the ABC class video to be educated on lymphedema risk reduction. This video lasts for about 30 minutes. It can be viewed on our website here: [email or web address]  Scar massage You can begin gentle scar massage to you incision sites. Gently place one hand on the incision and move the skin (without sliding on the skin) in various directions. Do this for a few minutes and then you can gently massage either coconut oil or vitamin E cream into the scars.  Compression garment You should continue wearing your  compression bra until you feel like you no longer have swelling.  Home exercise Program Continue doing the exercises you were given until you feel like you can do them without feeling any tightness at the end.   Walking Program Studies show that 30 minutes of walking per day (fast enough to elevate your heart rate) can significantly reduce the risk of a cancer recurrence. If you can't walk due to other medical reasons, we encourage you to find another activity you could do (like a stationary bike or water exercise).  Posture After breast cancer surgery, people frequently sit with rounded shoulders posture because it puts their incisions on slack and feels better. If you sit like this and scar tissue forms in that position, you can become very tight and have pain sitting or standing with good posture. Try to be aware of your posture and sit and stand up tall to heal properly.  Follow up PT: It is recommended you return every 3 months for the first 3 years following surgery to be assessed on the SOZO machine for an L-Dex score. This helps prevent clinically significant lymphedema in 95% of patients. These follow up screens are 10 minute appointments that you are not billed for.  Larue Saddie SAUNDERS, PT 08/03/2023, 11:42 AM  PHYSICAL THERAPY DISCHARGE SUMMARY  Visits from Start of Care: 2  Current functional level related to goals / functional outcomes: See above   Remaining deficits: Lymphedema risk    Education / Equipment: Self care  Plan: Patient agrees to discharge. Patient is being discharged due to meeting the stated rehab goals.

## 2023-08-04 ENCOUNTER — Encounter: Payer: Self-pay | Admitting: *Deleted

## 2023-08-04 DIAGNOSIS — Z170 Estrogen receptor positive status [ER+]: Secondary | ICD-10-CM

## 2023-08-07 NOTE — Assessment & Plan Note (Signed)
 06/06/2023:Screening mammogram detected left breast asymmetry and distortion at 12:00 spiculated mass by ultrasound measuring 1.5 cm, biopsy grade 2 IDC ER 100% PR 100% Ki67 1%, HER2 negative    Recommendations: 1. Breast conserving surgery: 07/12/23: Grade 2 IDC with DCIS 2.2 cm 0/1 LN Neg,  Margin Re-excision: Positive for DCIS (margin still positive) 2. Oncotype DX testing to determine if chemotherapy would be of any benefit followed by 3. Adjuvant radiation therapy followed by 4. Adjuvant antiestrogen therapy 5.  Genetic testing

## 2023-08-08 ENCOUNTER — Other Ambulatory Visit: Payer: Self-pay

## 2023-08-08 ENCOUNTER — Inpatient Hospital Stay: Attending: Licensed Clinical Social Worker | Admitting: Hematology and Oncology

## 2023-08-08 ENCOUNTER — Encounter (HOSPITAL_BASED_OUTPATIENT_CLINIC_OR_DEPARTMENT_OTHER): Payer: Self-pay | Admitting: General Surgery

## 2023-08-08 VITALS — BP 137/80 | HR 67 | Temp 98.1°F | Resp 16 | Ht 62.0 in | Wt 184.9 lb

## 2023-08-08 DIAGNOSIS — Z923 Personal history of irradiation: Secondary | ICD-10-CM | POA: Insufficient documentation

## 2023-08-08 DIAGNOSIS — Z1732 Human epidermal growth factor receptor 2 negative status: Secondary | ICD-10-CM | POA: Insufficient documentation

## 2023-08-08 DIAGNOSIS — Z170 Estrogen receptor positive status [ER+]: Secondary | ICD-10-CM | POA: Diagnosis not present

## 2023-08-08 DIAGNOSIS — Z1721 Progesterone receptor positive status: Secondary | ICD-10-CM | POA: Diagnosis not present

## 2023-08-08 DIAGNOSIS — C50412 Malignant neoplasm of upper-outer quadrant of left female breast: Secondary | ICD-10-CM | POA: Diagnosis present

## 2023-08-08 NOTE — Progress Notes (Signed)
 Patient Care Team: Daryl Setter, NP as PCP - General (Internal Medicine) Marget Lenis, MD as Consulting Physician (Obstetrics and Gynecology) Glean Stephane BROCKS, RN (Inactive) as Oncology Nurse Navigator Tyree Nanetta SAILOR, RN as Oncology Nurse Navigator Odean Potts, MD as Consulting Physician (Hematology and Oncology) Aron Shoulders, MD as Consulting Physician (General Surgery) Izell Domino, MD as Attending Physician (Radiation Oncology)  DIAGNOSIS:  Encounter Diagnosis  Name Primary?   Malignant neoplasm of upper-outer quadrant of left breast in female, estrogen receptor positive (HCC) Yes    SUMMARY OF ONCOLOGIC HISTORY: Oncology History  Malignant neoplasm of upper-outer quadrant of left breast in female, estrogen receptor positive (HCC)  06/06/2023 Initial Diagnosis   Screening mammogram detected left breast asymmetry and distortion at 12:00 spiculated mass by ultrasound measuring 1.5 cm, biopsy grade 2 IDC ER 100% PR 100% Ki67 1%, HER2 negative   06/15/2023 Cancer Staging   Staging form: Breast, AJCC 8th Edition - Clinical: Stage IA (cT1c, cN0, cM0, G2, ER+, PR+, HER2-) - Signed by Odean Potts, MD on 06/15/2023 Stage prefix: Initial diagnosis Histologic grading system: 3 grade system    Genetic Testing   [facility] CancerNext-Expanded Panel+RNA was Negative. Report date is 06/27/2023.   The CancerNext-Expanded gene panel offered by [facility] and includes sequencing, rearrangement, and RNA analysis for the following 77 genes: AIP, ALK, APC, ATM, AXIN2, BAP1, BARD1, BMPR1A, BRCA1, BRCA2, BRIP1, CDC73, CDH1, CDK4, CDKN1B, CDKN2A, CEBPA, CHEK2, CTNNA1, DDX41, DICER1, ETV6, FH, FLCN, GATA2, LZTR1, MAX, MBD4, MEN1, MET, MLH1, MSH2, MSH3, MSH6, MUTYH, NF1, NF2, NTHL1, PALB2, PHOX2B, PMS2, POT1, PRKAR1A, PTCH1, PTEN, RAD51C, RAD51D, RB1, RET, RPS20, RUNX1, SDHA, SDHAF2, SDHB, SDHC, SDHD, SMAD4, SMARCA4, SMARCB1, SMARCE1, STK11, SUFU, TMEM127, TP53, TSC1, TSC2, VHL, and WT1 (sequencing  and deletion/duplication); EGFR, HOXB13, KIT, MITF, PDGFRA, POLD1, and POLE (sequencing only); EPCAM and GREM1 (deletion/duplication only).       CHIEF COMPLIANT: Follow-up to discuss Oncotype DX test result  HISTORY OF PRESENT ILLNESS: History of Present Illness The patient, with breast cancer, presents for follow-up regarding treatment and surgical outcomes.  She is undergoing treatment for breast cancer and has had surgery to excise cancerous tissue. Concerns remain about the margins of the excised tissue, with potential residual ductal carcinoma in situ (DCIS). Another surgery is scheduled for August 16, 2023, to address this issue.  A seroma has developed in the area where tissue was removed, causing swelling in the upper body. She manages this with compression garments.  Her oncotype score is below 20, and with hormone therapy, her estimated risk of recurrence is 6% over the next ten years.  She has stopped hormone replacement therapy and experiences some warmth at night, which she manages with a blanket.  Radiation therapy is anticipated to start approximately six weeks after her upcoming surgery.     ALLERGIES:  is allergic to grass pollen(k-[id number]-t-swt vern), phenol, and other.  MEDICATIONS:  Current Outpatient Medications  Medication Sig Dispense Refill   albuterol  (PROAIR  HFA) 108 (90 Base) MCG/ACT inhaler USE 1-2 PUFFS BY MOUTH EVERY 4 HOURS AS NEEDED wheezing and shortness of breath 3 each 0   amLODipine  (NORVASC ) 5 MG tablet TAKE 1 TABLET BY MOUTH EVERY DAY 90 tablet 1   oxyCODONE  (OXY IR/ROXICODONE ) 5 MG immediate release tablet Take 1 tablet (5 mg total) by mouth every 6 (six) hours as needed for severe pain (pain score 7-10). 8 tablet 0   Phentermine  HCl (LOMAIRA ) 8 MG TABS Take 1 tablet (8 mg total) by mouth 2 (two)  times daily. (Patient not taking: Reported on 08/08/2023) 60 tablet 0   No current facility-administered medications for this visit.    PHYSICAL  EXAMINATION: ECOG PERFORMANCE STATUS: 1 - Symptomatic but completely ambulatory  Vitals:   08/08/23 1206  BP: 137/80  Pulse: 67  Resp: 16  Temp: 98.1 F (36.7 C)  SpO2: 100%   Filed Weights   08/08/23 1206  Weight: 184 lb 14.4 oz (83.9 kg)    LABORATORY DATA:  I have reviewed the data as listed    Latest Ref Rng & Units 06/15/2023   12:20 PM 05/04/2023   12:18 PM 03/16/2023   10:50 AM  CMP  Glucose 70 - 99 mg/dL 96  82  91   BUN 6 - 20 mg/dL 20  13  16    Creatinine 0.44 - 1.00 mg/dL 9.05  8.99  8.86   Sodium 135 - 145 mmol/L 140  139  139   Potassium 3.5 - 5.1 mmol/L 4.2  4.7  4.6   Chloride 98 - 111 mmol/L 106  102  101   CO2 22 - 32 mmol/L 29  25  23    Calcium 8.9 - 10.3 mg/dL 9.6  9.5  9.4   Total Protein 6.5 - 8.1 g/dL 7.7   7.2   Total Bilirubin 0.0 - 1.2 mg/dL 0.4   0.4   Alkaline Phos 38 - 126 U/L 48   56   AST 15 - 41 U/L 18   18   ALT 0 - 44 U/L 18   15     Lab Results  Component Value Date   WBC 6.2 06/15/2023   HGB 13.7 06/15/2023   HCT 40.8 06/15/2023   MCV 86.8 06/15/2023   PLT 357 06/15/2023   NEUTROABS 3.2 06/15/2023    ASSESSMENT & PLAN:  Malignant neoplasm of upper-outer quadrant of left breast in female, estrogen receptor positive (HCC) 06/06/2023:Screening mammogram detected left breast asymmetry and distortion at 12:00 spiculated mass by ultrasound measuring 1.5 cm, biopsy grade 2 IDC ER 100% PR 100% Ki67 1%, HER2 negative    Recommendations: 1. Breast conserving surgery: 07/12/23: Grade 2 IDC with DCIS 2.2 cm 0/1 LN Neg,  Margin Re-excision: Positive for DCIS (margin still positive) Margin reexcision planned for July 15 2. Oncotype DX testing score 9 (6% risk of distant recurrence): No role of chemotherapy 3. Adjuvant radiation therapy followed by 4. Adjuvant antiestrogen therapy 5.  Genetic testing ------------------------------------- Assessment and Plan Assessment & Plan Breast cancer Breast cancer with low oncotype score of 19. No  chemotherapy needed. Recurrence risk with hormone therapy is 6% over ten years. Surgery planned for clear margins, followed by radiation and hormone therapy to reduce recurrence risk. - Proceed with surgery on July 15 to achieve clear margins. - Plan for radiation therapy 3-6 weeks post-surgery, per radiation oncologist's assessment. - Discuss hormone therapy options, including tamoxifen, post-radiation.       No orders of the defined types were placed in this encounter.  The patient has a good understanding of the overall plan. she agrees with it. she will call with any problems that may develop before the next visit here. Total time spent: 30 mins including face to face time and time spent for planning, charting and co-ordination of care   Viinay K Kasen Adduci, MD 08/08/23

## 2023-08-09 ENCOUNTER — Ambulatory Visit: Admitting: Nurse Practitioner

## 2023-08-09 ENCOUNTER — Encounter: Payer: Self-pay | Admitting: Nurse Practitioner

## 2023-08-09 VITALS — BP 129/77 | HR 67 | Temp 98.1°F | Ht 62.0 in | Wt 180.0 lb

## 2023-08-09 DIAGNOSIS — R632 Polyphagia: Secondary | ICD-10-CM | POA: Diagnosis not present

## 2023-08-09 DIAGNOSIS — Z6832 Body mass index (BMI) 32.0-32.9, adult: Secondary | ICD-10-CM

## 2023-08-09 DIAGNOSIS — E66811 Obesity, class 1: Secondary | ICD-10-CM | POA: Diagnosis not present

## 2023-08-09 NOTE — Progress Notes (Signed)
 Office: [phone]  /  Fax: [phone]  WEIGHT SUMMARY AND BIOMETRICS  Weight Lost Since Last Visit: 0lb  Weight Gained Since Last Visit: 5lb   Vitals Temp: 98.1 F (36.7 C) BP: 129/77 Pulse Rate: 67 SpO2: 100 %   Anthropometric Measurements Height: 5' 2 (1.575 m) Weight: 180 lb (81.6 kg) BMI (Calculated): 32.91 Weight at Last Visit: 175lb Weight Lost Since Last Visit: 0lb Weight Gained Since Last Visit: 5lb Starting Weight: 205lb Total Weight Loss (lbs): 25 lb (11.3 kg)   Body Composition  Body Fat %: 34.3 % Fat Mass (lbs): 62 lbs Muscle Mass (lbs): 112.6 lbs Total Body Water (lbs): 74.4 lbs Visceral Fat Rating : 8   Other Clinical Data Fasting: Yes Labs: No Today's Visit #: 41 Starting Date: 03/26/21     HPI  Chief Complaint: OBESITY  Amber Stephens is here to discuss her progress with her obesity treatment plan. She is on the the Category 2 Plan and states she is following her eating plan approximately 60-70 % of the time. She states she is exercising 30 minutes 4-5 days per week.   Interval History:  Since last office visit she has gained 5 pounds.  She went on her cruise since her last visit.  She has has 2 breast surgeries and is scheduled for a 3rd surgery on 08/16/23.  She is planning to start radiation after her next surgery.  She is trying to do a balanced meal. Has been eating more protein and fruit but has also been eating more sweets.  Struggling with polyphagia and cravings since stopping Lomaira .   She is drinking water, protein shake and occ soda. She has been walking 4-5 days per week.    Pharmacotherapy for weight loss: She is not currently taking taking Lomaira  8mg -last dose was last week.   Previous pharmacotherapy for medical weight loss:  Lomaira  & Wellbutrin  (stopped due to tremors)   Bariatric surgery:  Patient has not had bariatric surgery.  PHYSICAL EXAM:  Blood pressure 129/77, pulse 67, temperature 98.1 F (36.7 C), height  5' 2 (1.575 m), weight 180 lb (81.6 kg), last menstrual period 06/04/2017, SpO2 100%. Body mass index is 32.92 kg/m.  General: She is overweight, cooperative, alert, well developed, and in no acute distress. PSYCH: Has normal mood, affect and thought process.   Extremities: No edema.  Neurologic: No gross sensory or motor deficits. No tremors or fasciculations noted.    DIAGNOSTIC DATA REVIEWED:  BMET    Component Value Date/Time   NA 140 06/15/2023 1220   NA 139 05/04/2023 1218   K 4.2 06/15/2023 1220   CL 106 06/15/2023 1220   CO2 29 06/15/2023 1220   GLUCOSE 96 06/15/2023 1220   BUN 20 06/15/2023 1220   BUN 13 05/04/2023 1218   CREATININE 0.94 06/15/2023 1220   CALCIUM 9.6 06/15/2023 1220   GFRNONAA >60 06/15/2023 1220   GFRAA >60 06/07/2017 0915   Lab Results  Component Value Date   HGBA1C 5.8 (H) 03/16/2023   HGBA1C 5.7 (H) 03/26/2021   Lab Results  Component Value Date   INSULIN  8.3 03/16/2023   INSULIN  19.1 03/26/2021   Lab Results  Component Value Date   TSH 0.741 03/16/2023   CBC    Component Value Date/Time   WBC 6.2 06/15/2023 1220   WBC 5.1 04/03/2019 1005   RBC 4.70 06/15/2023 1220   HGB 13.7 06/15/2023 1220   HGB 13.9 03/16/2023 1050   HCT 40.8 06/15/2023 1220   HCT  41.7 03/16/2023 1050   PLT 357 06/15/2023 1220   PLT 366 03/16/2023 1050   MCV 86.8 06/15/2023 1220   MCV 90 03/16/2023 1050   MCH 29.1 06/15/2023 1220   MCHC 33.6 06/15/2023 1220   RDW 13.3 06/15/2023 1220   RDW 12.4 03/16/2023 1050   Iron Studies    Component Value Date/Time   IRON 106 10/30/2012 1137   IRONPCTSAT 28.6 10/30/2012 1137   Lipid Panel     Component Value Date/Time   CHOL 160 03/16/2023 1050   TRIG 69 03/16/2023 1050   HDL 53 03/16/2023 1050   CHOLHDL 3 05/28/2020 0831   VLDL 11.4 05/28/2020 0831   LDLCALC 94 03/16/2023 1050   Hepatic Function Panel     Component Value Date/Time   PROT 7.7 06/15/2023 1220   PROT 7.2 03/16/2023 1050   ALBUMIN 4.5  06/15/2023 1220   ALBUMIN 4.4 03/16/2023 1050   AST 18 06/15/2023 1220   ALT 18 06/15/2023 1220   ALKPHOS 48 06/15/2023 1220   BILITOT 0.4 06/15/2023 1220   BILIDIR 0.1 04/03/2019 1005      Component Value Date/Time   TSH 0.741 03/16/2023 1050   Nutritional Lab Results  Component Value Date   VD25OH 39.0 03/16/2023   VD25OH 83.1 03/03/2022   VD25OH 72.4 08/05/2021     ASSESSMENT AND PLAN  TREATMENT PLAN FOR OBESITY:  Recommended Dietary Goals  Karman is currently in the action stage of change. As such, her goal is to continue weight management plan. She has agreed to practicing portion control and making smarter food choices, such as increasing vegetables and decreasing simple carbohydrates.  Behavioral Intervention  We discussed the following Behavioral Modification Strategies today: increasing lean protein intake to established goals, decreasing simple carbohydrates , increasing vegetables, increasing fiber rich foods, avoiding skipping meals, increasing water intake , work on meal planning and preparation, reading food labels , keeping healthy foods at home, and continue to work on maintaining a reduced calorie state, getting the recommended amount of protein, incorporating whole foods, making healthy choices, staying well hydrated and practicing mindfulness when eating..  Additional resources provided today: NA  Recommended Physical Activity Goals  Velora has been advised to work up to 150 minutes of moderate intensity aerobic activity a week and strengthening exercises 2-3 times per week for cardiovascular health, weight loss maintenance and preservation of muscle mass.   She has agreed to Think about enjoyable ways to increase daily physical activity and overcoming barriers to exercise and Increase physical activity in their day and reduce sedentary time (increase NEAT).  Exercise close Surgeons recommendations   Pharmacotherapy Will hold off on restarting  Lomaira  until after surgery and her next visit.   ASSOCIATED CONDITIONS ADDRESSED TODAY  Action/Plan  Polyphagia Intensive lifestyle modifications are the first line treatment for this issue. We discussed several lifestyle modifications today and she will continue to work on diet, exercise and weight loss efforts. Orders and follow up as documented in patient record.  Counseling Polyphagia is excessive hunger. Causes can include: low blood sugars, hypERthyroidism, PMS, lack of sleep, stress, insulin  resistance, diabetes, certain medications, and diets that are deficient in protein and fiber.    Class 1 obesity due to excess calories without serious comorbidity with body mass index (BMI) of 32.0 to 32.9 in adult      Return in about 5 weeks (around 09/13/2023).SABRA She was informed of the importance of frequent follow up visits to maximize her success with intensive lifestyle modifications  for her multiple health conditions.   ATTESTASTION STATEMENTS:  Reviewed by clinician on day of visit: allergies, medications, problem list, medical history, surgical history, family history, social history, and previous encounter notes.   Time spent on visit including pre-visit chart review and post-visit care and charting was 30 minutes.    Corean SAUNDERS. Deiona Hooper FNP-C

## 2023-08-10 ENCOUNTER — Telehealth: Payer: Self-pay | Admitting: Radiation Oncology

## 2023-08-10 NOTE — Telephone Encounter (Signed)
 Sent [facility] Breath Hold document via MyChart.

## 2023-08-16 ENCOUNTER — Ambulatory Visit (HOSPITAL_BASED_OUTPATIENT_CLINIC_OR_DEPARTMENT_OTHER): Admitting: Anesthesiology

## 2023-08-16 ENCOUNTER — Encounter (HOSPITAL_BASED_OUTPATIENT_CLINIC_OR_DEPARTMENT_OTHER): Payer: Self-pay | Admitting: General Surgery

## 2023-08-16 ENCOUNTER — Other Ambulatory Visit: Payer: Self-pay

## 2023-08-16 ENCOUNTER — Ambulatory Visit (HOSPITAL_BASED_OUTPATIENT_CLINIC_OR_DEPARTMENT_OTHER)
Admission: RE | Admit: 2023-08-16 | Discharge: 2023-08-16 | Disposition: A | Discharge status: Home / Self Care | Attending: General Surgery | Admitting: General Surgery

## 2023-08-16 ENCOUNTER — Encounter (HOSPITAL_BASED_OUTPATIENT_CLINIC_OR_DEPARTMENT_OTHER)
Admission: RE | Disposition: A | Discharge status: Home / Self Care | Payer: Self-pay | Source: Home / Self Care | Attending: General Surgery

## 2023-08-16 DIAGNOSIS — C50912 Malignant neoplasm of unspecified site of left female breast: Secondary | ICD-10-CM | POA: Diagnosis present

## 2023-08-16 DIAGNOSIS — I10 Essential (primary) hypertension: Secondary | ICD-10-CM | POA: Insufficient documentation

## 2023-08-16 DIAGNOSIS — E669 Obesity, unspecified: Secondary | ICD-10-CM | POA: Diagnosis not present

## 2023-08-16 DIAGNOSIS — Z6833 Body mass index (BMI) 33.0-33.9, adult: Secondary | ICD-10-CM | POA: Insufficient documentation

## 2023-08-16 DIAGNOSIS — M96843 Postprocedural seroma of a musculoskeletal structure following other procedure: Secondary | ICD-10-CM | POA: Diagnosis not present

## 2023-08-16 DIAGNOSIS — Z79899 Other long term (current) drug therapy: Secondary | ICD-10-CM | POA: Insufficient documentation

## 2023-08-16 HISTORY — PX: BREAST LUMPECTOMY: SHX2

## 2023-08-16 SURGERY — BREAST LUMPECTOMY
Anesthesia: General | Site: Breast | Laterality: Left

## 2023-08-16 MED ORDER — CEFAZOLIN SODIUM-DEXTROSE 2-4 GM/100ML-% IV SOLN
2.0000 g | INTRAVENOUS | Status: AC
  Administered 2023-08-16: 2 g via INTRAVENOUS

## 2023-08-16 MED ORDER — MIDAZOLAM HCL 2 MG/2ML IJ SOLN
INTRAMUSCULAR | Status: AC
  Filled 2023-08-16: qty 2

## 2023-08-16 MED ORDER — LIDOCAINE HCL (CARDIAC) PF 100 MG/5ML IV SOSY
PREFILLED_SYRINGE | INTRAVENOUS | Status: DC | PRN
  Administered 2023-08-16: 50 mg via INTRAVENOUS

## 2023-08-16 MED ORDER — 0.9 % SODIUM CHLORIDE (POUR BTL) OPTIME
TOPICAL | Status: DC | PRN
  Administered 2023-08-16: 100 mL

## 2023-08-16 MED ORDER — LACTATED RINGERS IV SOLN: INTRAVENOUS | Status: DC

## 2023-08-16 MED ORDER — EPHEDRINE 5 MG/ML INJ
INTRAVENOUS | Status: AC
  Filled 2023-08-16: qty 10

## 2023-08-16 MED ORDER — ONDANSETRON HCL 4 MG/2ML IJ SOLN: 4.0000 mg | Freq: Once | INTRAMUSCULAR | Status: DC | PRN

## 2023-08-16 MED ORDER — KETOROLAC TROMETHAMINE 30 MG/ML IJ SOLN
INTRAMUSCULAR | Status: AC
  Filled 2023-08-16: qty 1

## 2023-08-16 MED ORDER — MIDAZOLAM HCL 5 MG/5ML IJ SOLN
INTRAMUSCULAR | Status: DC | PRN
  Administered 2023-08-16: 2 mg via INTRAVENOUS

## 2023-08-16 MED ORDER — ONDANSETRON HCL 4 MG/2ML IJ SOLN
INTRAMUSCULAR | Status: AC
  Filled 2023-08-16: qty 2

## 2023-08-16 MED ORDER — FENTANYL CITRATE (PF) 100 MCG/2ML IJ SOLN
INTRAMUSCULAR | Status: DC | PRN
  Administered 2023-08-16: 50 ug via INTRAVENOUS

## 2023-08-16 MED ORDER — OXYCODONE HCL 5 MG PO TABS
ORAL_TABLET | ORAL | Status: AC
Start: 2023-08-16 — End: 2023-08-16
  Filled 2023-08-16: qty 1

## 2023-08-16 MED ORDER — OXYCODONE HCL 5 MG/5ML PO SOLN: 5.0000 mg | Freq: Once | ORAL | Status: AC | PRN

## 2023-08-16 MED ORDER — FENTANYL CITRATE (PF) 100 MCG/2ML IJ SOLN
INTRAMUSCULAR | Status: AC
  Filled 2023-08-16: qty 2

## 2023-08-16 MED ORDER — PROPOFOL 10 MG/ML IV BOLUS
INTRAVENOUS | Status: DC | PRN
  Administered 2023-08-16: 200 mg via INTRAVENOUS

## 2023-08-16 MED ORDER — ACETAMINOPHEN 500 MG PO TABS
ORAL_TABLET | ORAL | Status: AC
Start: 2023-08-16 — End: 2023-08-16
  Filled 2023-08-16: qty 2

## 2023-08-16 MED ORDER — FENTANYL CITRATE (PF) 100 MCG/2ML IJ SOLN: 25.0000 ug | INTRAMUSCULAR | Status: DC | PRN

## 2023-08-16 MED ORDER — PROPOFOL 500 MG/50ML IV EMUL
INTRAVENOUS | Status: DC | PRN
  Administered 2023-08-16: 150 ug/kg/min via INTRAVENOUS

## 2023-08-16 MED ORDER — ONDANSETRON HCL 4 MG/2ML IJ SOLN
INTRAMUSCULAR | Status: DC | PRN
Start: 2023-08-16 — End: 2023-08-16
  Administered 2023-08-16: 4 mg via INTRAVENOUS

## 2023-08-16 MED ORDER — CEFAZOLIN SODIUM-DEXTROSE 2-4 GM/100ML-% IV SOLN
INTRAVENOUS | Status: AC
  Filled 2023-08-16: qty 100

## 2023-08-16 MED ORDER — LIDOCAINE-EPINEPHRINE (PF) 1 %-1:200000 IJ SOLN
INTRAMUSCULAR | Status: DC | PRN
  Administered 2023-08-16: 60 mL

## 2023-08-16 MED ORDER — DEXAMETHASONE SODIUM PHOSPHATE 4 MG/ML IJ SOLN
INTRAMUSCULAR | Status: DC | PRN
  Administered 2023-08-16: 5 mg via INTRAVENOUS

## 2023-08-16 MED ORDER — AMISULPRIDE (ANTIEMETIC) 5 MG/2ML IV SOLN: 10.0000 mg | Freq: Once | INTRAVENOUS | Status: DC | PRN

## 2023-08-16 MED ORDER — LIDOCAINE 2% (20 MG/ML) 5 ML SYRINGE
INTRAMUSCULAR | Status: AC
  Filled 2023-08-16: qty 5

## 2023-08-16 MED ORDER — OXYCODONE HCL 5 MG PO TABS
5.0000 mg | ORAL_TABLET | Freq: Once | ORAL | Status: AC | PRN
  Administered 2023-08-16: 5 mg via ORAL

## 2023-08-16 MED ORDER — PROPOFOL 10 MG/ML IV BOLUS
INTRAVENOUS | Status: AC
  Filled 2023-08-16: qty 20

## 2023-08-16 MED ORDER — ACETAMINOPHEN 500 MG PO TABS
1000.0000 mg | ORAL_TABLET | ORAL | Status: AC
  Administered 2023-08-16: 1000 mg via ORAL

## 2023-08-16 MED ORDER — DEXAMETHASONE SODIUM PHOSPHATE 10 MG/ML IJ SOLN
INTRAMUSCULAR | Status: AC
Start: 2023-08-16 — End: 2023-08-16
  Filled 2023-08-16: qty 1

## 2023-08-16 MED ORDER — CHLORHEXIDINE GLUCONATE CLOTH 2 % EX PADS: 6.0000 | MEDICATED_PAD | Freq: Once | CUTANEOUS | Status: DC

## 2023-08-16 SURGICAL SUPPLY — 41 items
BLADE SURG 15 STRL LF DISP TIS (BLADE) ×1 IMPLANT
CANISTER SUCT 1200ML W/VALVE (MISCELLANEOUS) ×1 IMPLANT
CHLORAPREP W/TINT 26 (MISCELLANEOUS) ×1 IMPLANT
CLIP TI LARGE 6 (CLIP) IMPLANT
COVER BACK TABLE 60X90IN (DRAPES) ×1 IMPLANT
COVER MAYO STAND STRL (DRAPES) ×1 IMPLANT
DRAPE LAPAROTOMY 100X72 PEDS (DRAPES) ×1 IMPLANT
DRAPE UTILITY XL STRL (DRAPES) ×2 IMPLANT
ELECT COATED BLADE 2.86 ST (ELECTRODE) ×1 IMPLANT
ELECTRODE REM PT RTRN 9FT ADLT (ELECTROSURGICAL) ×1 IMPLANT
GAUZE PAD ABD 8X10 STRL (GAUZE/BANDAGES/DRESSINGS) IMPLANT
GAUZE SPONGE 4X4 12PLY STRL LF (GAUZE/BANDAGES/DRESSINGS) ×1 IMPLANT
GLOVE BIO SURGEON STRL SZ 6 (GLOVE) ×1 IMPLANT
GLOVE BIOGEL PI IND STRL 6.5 (GLOVE) ×1 IMPLANT
GOWN STRL REUS W/ TWL LRG LVL3 (GOWN DISPOSABLE) ×1 IMPLANT
GOWN STRL REUS W/ TWL XL LVL3 (GOWN DISPOSABLE) ×1 IMPLANT
ILLUMINATOR WAVEGUIDE N/F (MISCELLANEOUS) IMPLANT
KIT MARKER MARGIN INK (KITS) IMPLANT
NDL HYPO 25X1 1.5 SAFETY (NEEDLE) ×1 IMPLANT
NDL SAFETY ECLIPSE 18X1.5 (NEEDLE) IMPLANT
NEEDLE HYPO 25X1 1.5 SAFETY (NEEDLE) ×1 IMPLANT
NS IRRIG 1000ML POUR BTL (IV SOLUTION) ×1 IMPLANT
PACK BASIN DAY SURGERY FS (CUSTOM PROCEDURE TRAY) ×1 IMPLANT
PENCIL SMOKE EVACUATOR (MISCELLANEOUS) ×1 IMPLANT
SLEEVE SCD COMPRESS KNEE MED (STOCKING) ×1 IMPLANT
SPIKE FLUID TRANSFER (MISCELLANEOUS) IMPLANT
SPONGE T-LAP 18X18 ~~LOC~~+RFID (SPONGE) ×1 IMPLANT
STAPLER SKIN PROX WIDE 3.9 (STAPLE) IMPLANT
STRIP CLOSURE SKIN 1/2X4 (GAUZE/BANDAGES/DRESSINGS) ×1 IMPLANT
SUT MON AB 4-0 PC3 18 (SUTURE) ×1 IMPLANT
SUT SILK 2 0 SH (SUTURE) IMPLANT
SUT VIC AB 2-0 SH 27XBRD (SUTURE) IMPLANT
SUT VIC AB 3-0 54X BRD REEL (SUTURE) IMPLANT
SUT VIC AB 3-0 SH 27X BRD (SUTURE) ×1 IMPLANT
SYR 20ML LL LF (SYRINGE) IMPLANT
SYR BULB EAR ULCER 3OZ GRN STR (SYRINGE) ×1 IMPLANT
SYR CONTROL 10ML LL (SYRINGE) ×1 IMPLANT
TOWEL GREEN STERILE FF (TOWEL DISPOSABLE) ×1 IMPLANT
TRAY FAXITRON CT DISP (TRAY / TRAY PROCEDURE) IMPLANT
TUBE CONNECTING 20X1/4 (TUBING) ×1 IMPLANT
YANKAUER SUCT BULB TIP NO VENT (SUCTIONS) ×1 IMPLANT

## 2023-08-16 NOTE — H&P (Signed)
 Amber Stephens is an 47 y.o. female.   Chief Complaint: left breast cancer HPI:  Pt with left breast cancer, s/p reexcision with positive inferior margin x 2.  We got MRI with no additional lesions seen.  Pln reexcision again.  Patient also has symptomatic left axillary seroma.    Past Medical History:  Diagnosis Date   Allergic rhinitis    Allergy    As a child   Anemia    borderline, prior to hysterectomy   Asthma    Breast cancer (HCC) 2025   Cancer (HCC)    Breast   Fatigue    History of pneumonia age 30 and agin   my mom said at age 57 and again at age 64    Hypertension 05/28/2020   Multiple food allergies    Shortness of breath    Shortness of breath on exertion     Past Surgical History:  Procedure Laterality Date   ABDOMINAL HYSTERECTOMY N/A 06/16/2017   Procedure: HYSTERECTOMY ABDOMINAL TOTAL;  Surgeon: Rosalynn LELON Ingle, MD;  Location: [facility];  Service: Gynecology;  Laterality: N/A;   BILATERAL SALPINGECTOMY Bilateral 06/16/2017   Procedure: BILATERAL SALPINGECTOMY;  Surgeon: Rosalynn LELON Ingle, MD;  Location: [facility];  Service: Gynecology;  Laterality: Bilateral;   BREAST BIOPSY Left 06/06/2023   US  LT BREAST BX W LOC DEV 1ST LESION IMG BX SPEC US  GUIDE 06/06/2023 [facility]MO[facility]   BREAST BIOPSY  07/08/2023   MM LT RADIOACTIVE SEED LOC MAMMO GUIDE 07/08/2023 [facility]MO[facility]   BREAST LUMPECTOMY Left 07/20/2023   Procedure: BREAST LUMPECTOMY;  Surgeon: Aron Shoulders, MD;  Location: [facility];  Service: General;  Laterality: Left;  RE-EXCISION LUMPECTOMY LEFT BREAST   BREAST LUMPECTOMY WITH RADIOACTIVE SEED AND SENTINEL LYMPH NODE BIOPSY Left 07/12/2023   Procedure: RADIOACTIVE SEED GUIDED LEFT BREAST LUMPECTOMY WITH LEFT AXILLARY SENTINEL LYMPH NODE BIOPSY;  Surgeon: Aron Shoulders, MD;  Location: [facility];  Service: General;  Laterality: Left;   BTL  11/2008   Dr Marget; bilateral tubal ligation     CESAREAN SECTION     x4 , with each child , has 4 children    DILATION AND CURETTAGE OF UTERUS  07/02/2012   MYOMECTOMY  2001   Dr Ingle [facility]ynn at [facility]    TUBAL LIGATION  2010    Family History  Problem Relation Age of Onset   Alzheimer's disease Mother        died at 63 (diagnosed at 42)   Stroke Mother    Hypertension Father    Kidney cancer Father 79   Breast cancer Maternal Aunt 65   Stroke Maternal Grandmother    Diabetes Maternal Grandmother    Diabetes Maternal Grandfather        (had bilateral amputation and blindness)   Colon cancer Maternal Grandfather 66   AAA (abdominal aortic aneurysm) Maternal Grandfather    Cancer Maternal Aunt    Cancer Maternal Aunt    Stomach cancer Neg Hx    Esophageal cancer Neg Hx    Social History:  reports that she has never smoked. She has never used smokeless tobacco. She reports that she does not drink alcohol and does not use drugs.  Allergies:  Allergies  Allergen Reactions   Grass Pollen(K-O-R-T-Swt Vern) Shortness Of Breath   Phenol Shortness Of Breath   Other     Almonds    Medications Prior to Admission  Medication Sig Dispense Refill  amLODipine  (NORVASC ) 5 MG tablet TAKE 1 TABLET BY MOUTH EVERY DAY 90 tablet 1   Phentermine  HCl (LOMAIRA ) 8 MG TABS Take 1 tablet (8 mg total) by mouth 2 (two) times daily. 60 tablet 0   albuterol  (PROAIR  HFA) 108 (90 Base) MCG/ACT inhaler USE 1-2 PUFFS BY MOUTH EVERY 4 HOURS AS NEEDED wheezing and shortness of breath 3 each 0   oxyCODONE  (OXY IR/ROXICODONE ) 5 MG immediate release tablet Take 1 tablet (5 mg total) by mouth every 6 (six) hours as needed for severe pain (pain score 7-10). 8 tablet 0    No results found for this or any previous visit (from the past 48 hours). No results found.  Review of Systems  All other systems reviewed and are negative.   Blood pressure (!) 135/96, pulse 63, temperature (!) 97.3 F (36.3 C), temperature source Temporal, resp. rate 14,  height 5' 2 (1.575 m), weight 84.1 kg, last menstrual period 06/04/2017, SpO2 100%. Physical Exam Vitals reviewed.  Constitutional:      Appearance: Normal appearance.  HENT:     Head: Normocephalic and atraumatic.     Right Ear: External ear normal.     Left Ear: External ear normal.  Eyes:     General: No scleral icterus.    Extraocular Movements: Extraocular movements intact.     Pupils: Pupils are equal, round, and reactive to light.  Cardiovascular:     Rate and Rhythm: Normal rate and regular rhythm.     Pulses: Normal pulses.  Pulmonary:     Effort: Pulmonary effort is normal.     Comments: Left axillary seroma Abdominal:     General: Abdomen is flat.  Musculoskeletal:        General: No swelling, tenderness, deformity or signs of injury. Normal range of motion.     Cervical back: Normal range of motion.  Skin:    General: Skin is warm and dry.     Capillary Refill: Capillary refill takes 2 to 3 seconds.     Coloration: Skin is not jaundiced or pale.  Neurological:     General: No focal deficit present.     Mental Status: She is alert and oriented to person, place, and time.  Psychiatric:        Mood and Affect: Mood normal.        Behavior: Behavior normal.        Thought Content: Thought content normal.        Judgment: Judgment normal.      Assessment/Plan Left breast cancer with focally positive margin for DCIS.   Plan reexcision inferior margin Aspiration left axillary seroma.   Reviewed procedure.   Jina Nephew, MD 08/16/2023, 11:39 AM

## 2023-08-16 NOTE — Anesthesia Postprocedure Evaluation (Signed)
 Anesthesia Post Note  Patient: Amber Stephens  Procedure(s) Performed: REEXCISION LEFT BREAST LUMPECTOMY (Left: Breast)     Patient location during evaluation: PACU Anesthesia Type: General Level of consciousness: awake and alert and oriented Pain management: pain level controlled Vital Signs Assessment: post-procedure vital signs reviewed and stable Respiratory status: spontaneous breathing, nonlabored ventilation and respiratory function stable Cardiovascular status: blood pressure returned to baseline and stable Postop Assessment: no apparent nausea or vomiting Anesthetic complications: no   No notable events documented.  Last Vitals:  Vitals:   08/16/23 1330 08/16/23 1345  BP: 137/65 138/89  Pulse: 77 68  Resp: (!) 7 12  Temp: (!) 36.1 C   SpO2: 98% 99%    Last Pain:  Vitals:   08/16/23 0942  TempSrc: Temporal  PainSc: 0-No pain                 Aidan Caloca A.

## 2023-08-16 NOTE — Op Note (Signed)
 Re-excisional Left Breast Lumpectomy   Indications: This patient presents with history of positive margins after partial mastectomy for left breast cancer   Pre-operative Diagnosis: left breast cancer   Post-operative Diagnosis: left breast cancer   Surgeon: ARON SHOULDERS   Assistants: n/a   Anesthesia: General anesthesia and Local anesthesia   ASA Class: 2   Procedure Details  The patient was seen in the Holding Room. The risks, benefits, complications, treatment options, and expected outcomes were discussed with the patient. The possibilities of reaction to medication, pulmonary aspiration, bleeding, infection, the need for additional procedures, failure to diagnose a condition, and creating a complication requiring transfusion or operation were discussed with the patient. The patient concurred with the proposed plan, giving informed consent. The site of surgery properly noted/marked. The patient was taken to Operating Room # 1, identified, and the procedure verified as re-excision of left breast cancer.  After induction of anesthesia, the left breast and chest were prepped and draped in standard fashion.  The lumpectomy was performed by reopening the prior incision. Seroma was aspirated. The mastopexy sutures were removed. Additional margins were taken at the inferior borders of the partial mastectomy cavity. Dissection was carried down to the pectoral fascia. The specimen was marked with the margin marker paint kit.  Hemostasis was achieved with cautery.  Local was infiltrated into the surrounding tissue.  The deeper tissue was pulled together with 2-0 vicryl interrupted sutures.  The wound was irrigated and closed with a 3-0 Vicryl deep dermal interrupted and a 4-0 Monocryl subcuticular closure in layers.  Sterile dressings were applied. At the end of the operation, all sponge, instrument, and needle counts were correct.   Findings:  grossly clear surgical margins, posterior margin is  pectoralis  Estimated Blood Loss: Minimal   Drains: none   Specimens: additional inferior margins.   Complications: None; patient tolerated the procedure well.   Disposition: PACU - hemodynamically stable.   Condition: stable

## 2023-08-16 NOTE — Transfer of Care (Signed)
 Immediate Anesthesia Transfer of Care Note  Patient: Amber Stephens  Procedure(s) Performed: REEXCISION LEFT BREAST LUMPECTOMY (Left: Breast)  Patient Location: PACU  Anesthesia Type:General  Level of Consciousness: awake, alert , and oriented  Airway & Oxygen Therapy: Patient Spontanous Breathing and Patient connected to face mask oxygen  Post-op Assessment: Report given to RN and Post -op Vital signs reviewed and stable  Post vital signs: Reviewed and stable  Last Vitals:  Vitals Value Taken Time  BP 137/65 08/16/23 13:30  Temp 36.1 C 08/16/23 13:30  Pulse 74 08/16/23 13:31  Resp 6 08/16/23 13:31  SpO2 100 % 08/16/23 13:31  Vitals shown include unfiled device data.  Last Pain:  Vitals:   08/16/23 0942  TempSrc: Temporal  PainSc: 0-No pain         Complications: No notable events documented.

## 2023-08-16 NOTE — Anesthesia Preprocedure Evaluation (Addendum)
 Anesthesia Evaluation  Patient identified by MRN, date of birth, ID band Patient awake    Reviewed: Allergy & Precautions, NPO status , Patient's Chart, lab work & pertinent test results  Airway Mallampati: II  TM Distance: >3 FB     Dental no notable dental hx. (+) Teeth Intact, Dental Advisory Given   Pulmonary shortness of breath and with exertion, asthma    Pulmonary exam normal breath sounds clear to auscultation       Cardiovascular hypertension, Pt. on medications Normal cardiovascular exam Rhythm:Regular Rate:Normal     Neuro/Psych negative neurological ROS  negative psych ROS   GI/Hepatic negative GI ROS, Neg liver ROS,,,  Endo/Other  Obesity- on Phentermine - last dose 1 week ago Left Breast Ca  Renal/GU negative Renal ROS  negative genitourinary   Musculoskeletal negative musculoskeletal ROS (+)    Abdominal  (+) + obese  Peds  Hematology  (+) Blood dyscrasia, anemia   Anesthesia Other Findings   Reproductive/Obstetrics                              Anesthesia Physical Anesthesia Plan  ASA: 2  Anesthesia Plan: General   Post-op Pain Management: Minimal or no pain anticipated   Induction: Intravenous  PONV Risk Score and Plan: 4 or greater and Treatment may vary due to age or medical condition, Midazolam , Ondansetron  and Dexamethasone   Airway Management Planned: LMA  Additional Equipment: None  Intra-op Plan:   Post-operative Plan: Extubation in OR  Informed Consent: I have reviewed the patients History and Physical, chart, labs and discussed the procedure including the risks, benefits and alternatives for the proposed anesthesia with the patient or authorized representative who has indicated his/her understanding and acceptance.     Dental advisory given  Plan Discussed with: Anesthesiologist and CRNA  Anesthesia Plan Comments:          Anesthesia  Quick Evaluation

## 2023-08-16 NOTE — Discharge Instructions (Addendum)
 [facility] Office Phone Number [phone]  BREAST BIOPSY/ PARTIAL MASTECTOMY: POST OP INSTRUCTIONS  Always review your discharge instruction sheet given to you by the facility where your surgery was performed.  IF YOU HAVE DISABILITY OR FAMILY LEAVE FORMS, YOU MUST BRING THEM TO THE OFFICE FOR PROCESSING.  DO NOT GIVE THEM TO YOUR DOCTOR.  Take 2 tylenol  (acetominophen) three times a day for 3 days.  If you still have pain, add ibuprofen  with food in between if able to take this (if you have kidney issues or stomach issues, do not take ibuprofen ).  If both of those are not enough, add the narcotic pain pill.  If you find you are needing a lot of this overnight after surgery, call the next morning for a refill.    Prescriptions will not be filled after 5pm or on week-ends. Take your usually prescribed medications unless otherwise directed You should eat very light the first 24 hours after surgery, such as soup, crackers, pudding, etc.  Resume your normal diet the day after surgery. Most patients will experience some swelling and bruising in the breast.  Ice packs and a good support bra will help.  Swelling and bruising can take several days to resolve.  It is common to experience some constipation if taking pain medication after surgery.  Increasing fluid intake and taking a stool softener will usually help or prevent this problem from occurring.  A mild laxative (Milk of Magnesia or Miralax) should be taken according to package directions if there are no bowel movements after 48 hours. Unless discharge instructions indicate otherwise, you may remove your bandages 48 hours after surgery, and you may shower at that time.  You may have steri-strips (small skin tapes) in place directly over the incision.  These strips should be left on the skin at least for for 7-10 days.    ACTIVITIES:  You may resume regular daily activities (gradually increasing) beginning the next day.  Wearing a  good support bra or sports bra (or the breast binder) minimizes pain and swelling.  You may have sexual intercourse when it is comfortable. No heavy lifting for 1-2 weeks (not over around 10 pounds).  You may drive when you no longer are taking prescription pain medication, you can comfortably wear a seatbelt, and you can safely maneuver your car and apply brakes. RETURN TO WORK:  __________3-14 days depending on job. _______________ Amber Stephens should see your doctor in the office for a follow-up appointment approximately two weeks after your surgery.  Your doctor's nurse will typically make your follow-up appointment when she calls you with your pathology report.  Expect your pathology report 3-4 business days after your surgery.  You may call to check if you do not hear from us  after three days.   WHEN TO CALL YOUR DOCTOR: Fever over 101.0 Nausea and/or vomiting. Extreme swelling or bruising. Continued bleeding from incision. Increased pain, redness, or drainage from the incision.  The clinic staff is available to answer your questions during regular business hours.  Please don't hesitate to call and ask to speak to one of the nurses for clinical concerns.  If you have a medical emergency, go to the nearest emergency room or call 911.  A surgeon from [facility] is always on call at the hospital.  For further questions, please visit [email or web address]  No Tylenol  until 3:48pm today, if needed.

## 2023-08-16 NOTE — Anesthesia Procedure Notes (Signed)
 Procedure Name: LMA Insertion Date/Time: 08/16/2023 12:23 PM  Performed by: Buster Catheryn SAUNDERS, CRNAPre-anesthesia Checklist: Patient identified, Emergency Drugs available, Suction available and Patient being monitored Patient Re-evaluated:Patient Re-evaluated prior to induction Oxygen Delivery Method: Circle system utilized Preoxygenation: Pre-oxygenation with 100% oxygen Induction Type: IV induction Ventilation: Mask ventilation without difficulty LMA: LMA inserted LMA Size: 4.0 Number of attempts: 1 Airway Equipment and Method: Bite block Placement Confirmation: positive ETCO2 Tube secured with: Tape Dental Injury: Teeth and Oropharynx as per pre-operative assessment

## 2023-08-17 ENCOUNTER — Ambulatory Visit: Payer: Self-pay | Admitting: General Surgery

## 2023-08-17 ENCOUNTER — Encounter (HOSPITAL_BASED_OUTPATIENT_CLINIC_OR_DEPARTMENT_OTHER): Payer: Self-pay | Admitting: General Surgery

## 2023-08-17 LAB — SURGICAL PATHOLOGY

## 2023-08-19 ENCOUNTER — Encounter: Payer: Self-pay | Admitting: *Deleted

## 2023-09-09 NOTE — Progress Notes (Signed)
 Location of Breast Cancer: Malignant Neoplasm of Upper-Outer Quadrant of Left Breast, Estrogen Receptor Positive   Histology per Pathology Report:    Receptor Status: ER(100%), PR (100%), Her2-neu (Negative), Ki-67(1%)  Did patient present with symptoms (if so, please note symptoms) or was this found on screening mammography?:  Found lump in exam   Past/Anticipated interventions by surgeon, if any: Aron, MD Left Breast Lumpectomy    Past/Anticipated interventions by medical oncology, if any:  08/08/2023 Odean, MD   Lymphedema issues, if any:   None   Pain issues, if any:  None   SAFETY ISSUES: Prior radiation? None Pacemaker/ICD? None Possible current pregnancy?N/A Is the patient on methotrexate? None  Current Complaints / other details:  None BP (!) 154/96 (BP Location: Left Arm, Patient Position: Sitting, Cuff Size: Large)   Pulse 72   Temp 97.9 F (36.6 C)   Resp 20   Ht 5' 2 (1.575 m)   Wt 192 lb (87.1 kg)   LMP 06/04/2017   SpO2 100%   BMI 35.12 kg/m   Wt Readings from Last 3 Encounters:  09/21/23 192 lb (87.1 kg)  09/21/23 186 lb (84.4 kg)  08/16/23 185 lb 6.5 oz (84.1 kg)

## 2023-09-13 ENCOUNTER — Ambulatory Visit: Admitting: Radiation Oncology

## 2023-09-13 ENCOUNTER — Ambulatory Visit

## 2023-09-20 ENCOUNTER — Encounter: Payer: Self-pay | Admitting: *Deleted

## 2023-09-20 NOTE — Progress Notes (Signed)
 Radiation Oncology         [phone] ________________________________  Name: Amber Stephens MRN: [id number]  Date: 09/21/2023  DOB: May 03, 1975  Follow-Up Visit Note  Outpatient  CC: Daryl Setter, NP  Odean Potts, MD  Diagnosis:   No diagnosis found. *** Malignant neoplasm of upper-outer quadrant of left breast in female, estrogen receptor positive (CMS/HHS-HCC)    Cancer Staging  Malignant neoplasm of upper-outer quadrant of left breast in female, estrogen receptor positive (HCC) Staging form: Breast, AJCC 8th Edition - Clinical: Stage IA (cT1c, cN0, cM0, G2, ER+, PR+, HER2-) - Signed by Odean Potts, MD on 06/15/2023  CHIEF COMPLAINT: Here to discuss management of left breast cancer  Narrative:  The patient returns today for follow-up. She was last seen on 06/15/23 at the breast clinic.     She presented for a consult visit with Dr. Gudena on 06/15/23 to discuss further treatment plan. She will proceed with a breast lumpectomy and Oncotype testing followed by radiation and antiestrogen therapy.    Since consultation date, she underwent genetic testing on 07/11/23 showing no pathogenic mutations contributing to her recent diagnosis.    Patient underwent a radioactive seed guided left breast lumpectomy with sentinel lymph node biopsy on 07/12/23 under the care of Dr. Aron. Surgical pathology revealed: tumor size of 2.2 x 1.8 x 1.0 cm; histology of grade 2  Invasive moderately differentiated ductal adenocarcinoma and intermediate nuclear grade DCIS solid and cribriform types; negative margins status to invasive disease, closest margin 12 mm from inferior margin; positive inferior margin status to in situ disease; nodal status negative for malignancy;  ER status: 100%, strong staining; PR status 100%, strong staining, Her2 status Group 5 negative by FISH; Grade 2. Ki-67: 1%.   Oncotype DX was obtained on the final surgical sample and the recurrence score of 19 predicts a risk  of recurrence outside the breast over the next 9 years of 6%, if the patient's only systemic therapy is an antiestrogen for 5 years.  It also predicts no significant benefit from chemotherapy.  Due to the positive inferior margin of left breast DCIS, she underwent a second breast lumpectomy for re-excision on 07/20/23. Pathology indicated presence of DCIS in new inferior margin. As a result, she underwent a third re-excision on 08/16/23. Pathology indicated final inferior margin negative for malignancy.  During her most recent post-op follow-up with Dr. Aron on 09/05/23, patient will return in 6 months to discuss whether or not she desires referral to plastic surgery for contralateral reduction and/or symmetry. She is to start antihormonal therapy following her radiation.      Symptomatically, the patient reports: ***        ALLERGIES:  is allergic to grass pollen(k-o-r-t-swt vern), phenol, and other.  Meds: Current Outpatient Medications  Medication Sig Dispense Refill   albuterol  (PROAIR  HFA) 108 (90 Base) MCG/ACT inhaler USE 1-2 PUFFS BY MOUTH EVERY 4 HOURS AS NEEDED wheezing and shortness of breath 3 each 0   amLODipine  (NORVASC ) 5 MG tablet TAKE 1 TABLET BY MOUTH EVERY DAY 90 tablet 1   oxyCODONE  (OXY IR/ROXICODONE ) 5 MG immediate release tablet Take 1 tablet (5 mg total) by mouth every 6 (six) hours as needed for severe pain (pain score 7-10). 8 tablet 0   Phentermine  HCl (LOMAIRA ) 8 MG TABS Take 1 tablet (8 mg total) by mouth 2 (two) times daily. 60 tablet 0   No current facility-administered medications for this encounter.    Physical Findings:  vitals were  not taken for this visit. .     General: Alert and oriented, in no acute distress HEENT: Head is normocephalic. Extraocular movements are intact. Oropharynx is clear. Neck: Neck is supple, no palpable cervical or supraclavicular lymphadenopathy. Heart: Regular in rate and rhythm with no murmurs, rubs, or gallops. Chest: Clear to  auscultation bilaterally, with no rhonchi, wheezes, or rales. Abdomen: Soft, nontender, nondistended, with no rigidity or guarding. Extremities: No cyanosis or edema. Lymphatics: see Neck Exam Musculoskeletal: symmetric strength and muscle tone throughout. Neurologic: No obvious focalities. Speech is fluent.  Psychiatric: Judgment and insight are intact. Affect is appropriate. Breast exam reveals ***  Lab Findings: Lab Results  Component Value Date   WBC 6.2 06/15/2023   HGB 13.7 06/15/2023   HCT 40.8 06/15/2023   MCV 86.8 06/15/2023   PLT 357 06/15/2023    @LASTCHEMISTRY @  Radiographic Findings: No results found.  Impression/Plan: We discussed adjuvant radiotherapy today.  I recommend *** in order to ***.  I reviewed the logistics, benefits, risks, and potential side effects of this treatment in detail. Risks may include but not necessary be limited to acute and late injury tissue in the radiation fields such as skin irritation (change in color/pigmentation, itching, dryness, pain, peeling). She may experience fatigue. We also discussed possible risk of long term cosmetic changes or scar tissue. There is also a smaller risk for lung toxicity, ***cardiac toxicity, ***brachial plexopathy, ***lymphedema, ***musculoskeletal changes, ***rib fragility or ***induction of a second malignancy, ***late chronic non-healing soft tissue wound.    The patient asked good questions which I answered to her satisfaction. She is enthusiastic about proceeding with treatment. A consent form has been *** signed and placed in her chart.  A total of *** medically necessary complex treatment devices will be fabricated and supervised by me: *** fields with MLCs for custom blocks to protect heart, and lungs;  and, a Vac-lok. MORE COMPLEX DEVICES MAY BE MADE IN DOSIMETRY FOR FIELD IN FIELD BEAMS FOR DOSE HOMOGENEITY.  I have requested : 3D Simulation which is medically necessary to give adequate dose to at risk  tissues while sparing lungs and heart.  I have requested a DVH of the following structures: lungs, heart, *** lumpectomy cavity.    The patient will receive *** Gy in *** fractions to the *** with *** fields.  This will be *** followed by a boost.  On date of service, in total, I spent *** minutes on this encounter. Patient was seen in person.  _____________________________________   Lauraine Golden, MD  This document serves as a record of services personally performed by Lauraine Golden, MD. It was created on her behalf by Reymundo Cartwright, a trained medical scribe. The creation of this record is based on the scribe's personal observations and the provider's statements to them. This document has been checked and approved by the attending provider.

## 2023-09-21 ENCOUNTER — Ambulatory Visit
Admission: RE | Admit: 2023-09-21 | Discharge: 2023-09-21 | Disposition: A | Discharge status: Home / Self Care | Source: Ambulatory Visit | Attending: Radiation Oncology | Admitting: Radiation Oncology

## 2023-09-21 ENCOUNTER — Ambulatory Visit (INDEPENDENT_AMBULATORY_CARE_PROVIDER_SITE_OTHER): Admitting: Nurse Practitioner

## 2023-09-21 ENCOUNTER — Encounter: Payer: Self-pay | Admitting: Radiation Oncology

## 2023-09-21 ENCOUNTER — Encounter: Payer: Self-pay | Admitting: Nurse Practitioner

## 2023-09-21 VITALS — BP 154/96 | HR 72 | Temp 97.9°F | Resp 20 | Ht 62.0 in | Wt 192.0 lb

## 2023-09-21 VITALS — BP 124/88 | HR 78 | Temp 98.5°F | Ht 62.0 in | Wt 186.0 lb

## 2023-09-21 DIAGNOSIS — Z510 Encounter for antineoplastic radiation therapy: Secondary | ICD-10-CM | POA: Diagnosis present

## 2023-09-21 DIAGNOSIS — R632 Polyphagia: Secondary | ICD-10-CM

## 2023-09-21 DIAGNOSIS — Z170 Estrogen receptor positive status [ER+]: Secondary | ICD-10-CM

## 2023-09-21 DIAGNOSIS — C50412 Malignant neoplasm of upper-outer quadrant of left female breast: Secondary | ICD-10-CM | POA: Diagnosis present

## 2023-09-21 DIAGNOSIS — E66811 Obesity, class 1: Secondary | ICD-10-CM

## 2023-09-21 DIAGNOSIS — Z6834 Body mass index (BMI) 34.0-34.9, adult: Secondary | ICD-10-CM | POA: Diagnosis not present

## 2023-09-21 MED ORDER — LOMAIRA 8 MG PO TABS: 1.0000 | ORAL_TABLET | Freq: Two times a day (BID) | ORAL | 0 refills | Status: DC

## 2023-09-21 NOTE — Progress Notes (Signed)
 Office: [phone]  /  Fax: [phone]  WEIGHT SUMMARY AND BIOMETRICS  Weight Lost Since Last Visit: 0lb  Weight Gained Since Last Visit: 6lb   Vitals Temp: 98.5 F (36.9 C) BP: 124/88 Pulse Rate: 78 SpO2: 100 %   Anthropometric Measurements Height: 5' 2 (1.575 m) Weight: 186 lb (84.4 kg) BMI (Calculated): 34.01 Weight at Last Visit: 180lb Weight Lost Since Last Visit: 0lb Weight Gained Since Last Visit: 6lb Starting Weight: 205lb Total Weight Loss (lbs): 19 lb (8.618 kg)   Body Composition  Body Fat %: 36.1 % Fat Mass (lbs): 67.2 lbs Muscle Mass (lbs): 113.2 lbs Total Body Water (lbs): 76.4 lbs Visceral Fat Rating : 9   Other Clinical Data Fasting: Yes Labs: No Today's Visit #: 42 Starting Date: 03/26/21     HPI  Chief Complaint: OBESITY  Jeanae is here to discuss her progress with her obesity treatment plan. She is on the the Category 2 Plan and states she is following her eating plan approximately 10 % of the time. She states she is exercising 30 minutes 4 days per week.   Interval History:  Since last office visit she has gained 6 pounds.  She recently returned from a trip to [place] and [place].  She had a 3rd breast surgery on 08/16/23.  She has an appt today to discuss radiation treatments. She is walking 4 days per week.  Notes polyphagia.     Pharmacotherapy for weight loss: She is currently taking Lomaira  8mg  BID (restarted 3 days ago) for medical weight loss. Stopped due to surgery.  Denies side effects.    Previous pharmacotherapy for medical weight loss:  Lomaira  & Wellbutrin  (stopped due to tremors)   Bariatric surgery:  Patient has not had bariatric surgery       PHYSICAL EXAM:  Blood pressure 124/88, pulse 78, temperature 98.5 F (36.9 C), height 5' 2 (1.575 m), weight 186 lb (84.4 kg), last menstrual period 06/04/2017, SpO2 100%. Body mass index is 34.02 kg/m.  General: She is overweight, cooperative, alert, well  developed, and in no acute distress. PSYCH: Has normal mood, affect and thought process.   Extremities: No edema.  Neurologic: No gross sensory or motor deficits. No tremors or fasciculations noted.    DIAGNOSTIC DATA REVIEWED:  BMET    Component Value Date/Time   NA 140 06/15/2023 1220   NA 139 05/04/2023 1218   K 4.2 06/15/2023 1220   CL 106 06/15/2023 1220   CO2 29 06/15/2023 1220   GLUCOSE 96 06/15/2023 1220   BUN 20 06/15/2023 1220   BUN 13 05/04/2023 1218   CREATININE 0.94 06/15/2023 1220   CALCIUM 9.6 06/15/2023 1220   GFRNONAA >60 06/15/2023 1220   GFRAA >60 06/07/2017 0915   Lab Results  Component Value Date   HGBA1C 5.8 (H) 03/16/2023   HGBA1C 5.7 (H) 03/26/2021   Lab Results  Component Value Date   INSULIN  8.3 03/16/2023   INSULIN  19.1 03/26/2021   Lab Results  Component Value Date   TSH 0.741 03/16/2023   CBC    Component Value Date/Time   WBC 6.2 06/15/2023 1220   WBC 5.1 04/03/2019 1005   RBC 4.70 06/15/2023 1220   HGB 13.7 06/15/2023 1220   HGB 13.9 03/16/2023 1050   HCT 40.8 06/15/2023 1220   HCT 41.7 03/16/2023 1050   PLT 357 06/15/2023 1220   PLT 366 03/16/2023 1050   MCV 86.8 06/15/2023 1220   MCV 90 03/16/2023 1050   MCH  29.1 06/15/2023 1220   MCHC 33.6 06/15/2023 1220   RDW 13.3 06/15/2023 1220   RDW 12.4 03/16/2023 1050   Iron Studies    Component Value Date/Time   IRON 106 10/30/2012 1137   IRONPCTSAT 28.6 10/30/2012 1137   Lipid Panel     Component Value Date/Time   CHOL 160 03/16/2023 1050   TRIG 69 03/16/2023 1050   HDL 53 03/16/2023 1050   CHOLHDL 3 05/28/2020 0831   VLDL 11.4 05/28/2020 0831   LDLCALC 94 03/16/2023 1050   Hepatic Function Panel     Component Value Date/Time   PROT 7.7 06/15/2023 1220   PROT 7.2 03/16/2023 1050   ALBUMIN 4.5 06/15/2023 1220   ALBUMIN 4.4 03/16/2023 1050   AST 18 06/15/2023 1220   ALT 18 06/15/2023 1220   ALKPHOS 48 06/15/2023 1220   BILITOT 0.4 06/15/2023 1220   BILIDIR 0.1  04/03/2019 1005      Component Value Date/Time   TSH 0.741 03/16/2023 1050   Nutritional Lab Results  Component Value Date   VD25OH 39.0 03/16/2023   VD25OH 83.1 03/03/2022   VD25OH 72.4 08/05/2021     ASSESSMENT AND PLAN  TREATMENT PLAN FOR OBESITY:  Recommended Dietary Goals  Alayiah is currently in the action stage of change. As such, her goal is to continue weight management plan. She has agreed to the Category 2 Plan.  Behavioral Intervention  We discussed the following Behavioral Modification Strategies today: increasing lean protein intake to established goals, decreasing simple carbohydrates , increasing vegetables, increasing fiber rich foods, increasing water intake , work on meal planning and preparation, reading food labels , keeping healthy foods at home, and continue to work on maintaining a reduced calorie state, getting the recommended amount of protein, incorporating whole foods, making healthy choices, staying well hydrated and practicing mindfulness when eating..  Additional resources provided today: NA  Recommended Physical Activity Goals  Tarren has been advised to work up to 150 minutes of moderate intensity aerobic activity a week and strengthening exercises 2-3 times per week for cardiovascular health, weight loss maintenance and preservation of muscle mass.   She has agreed to Think about enjoyable ways to increase daily physical activity and overcoming barriers to exercise, Increase physical activity in their day and reduce sedentary time (increase NEAT)., and continue to gradually increase the amount and intensity of exercise routine   Pharmacotherapy We discussed various medication options to help Johara with her weight loss efforts and we both agreed to continue Lomaira  8mg  BID.  Side effects discussed.  To discuss continuation of Lomaira  with oncology today.  ASSOCIATED CONDITIONS ADDRESSED TODAY  Action/Plan  Polyphagia -     Lomaira ; Take  1 tablet (8 mg total) by mouth 2 (two) times daily.  Dispense: 60 tablet; Refill: 0  Obesity (BMI 30.0-34.9) -     Lomaira ; Take 1 tablet (8 mg total) by mouth 2 (two) times daily.  Dispense: 60 tablet; Refill: 0         Return in about 4 weeks (around 10/19/2023).SABRA She was informed of the importance of frequent follow up visits to maximize her success with intensive lifestyle modifications for her multiple health conditions.   ATTESTASTION STATEMENTS:  Reviewed by clinician on day of visit: allergies, medications, problem list, medical history, surgical history, family history, social history, and previous encounter notes.     Corean SAUNDERS. Novalie Leamy FNP-C

## 2023-09-26 DIAGNOSIS — Z510 Encounter for antineoplastic radiation therapy: Secondary | ICD-10-CM | POA: Diagnosis not present

## 2023-09-27 ENCOUNTER — Other Ambulatory Visit: Payer: Self-pay

## 2023-09-27 ENCOUNTER — Ambulatory Visit
Admission: RE | Admit: 2023-09-27 | Discharge: 2023-09-27 | Discharge status: Home / Self Care | Source: Ambulatory Visit | Attending: Radiation Oncology | Admitting: Radiation Oncology

## 2023-09-27 DIAGNOSIS — Z510 Encounter for antineoplastic radiation therapy: Secondary | ICD-10-CM | POA: Diagnosis not present

## 2023-09-27 LAB — RAD ONC ARIA SESSION SUMMARY
Course Elapsed Days: 0
Plan Fractions Treated to Date: 1
Plan Prescribed Dose Per Fraction: 2.67 Gy
Plan Total Fractions Prescribed: 15
Plan Total Prescribed Dose: 40.05 Gy
Reference Point Dosage Given to Date: 2.67 Gy
Reference Point Session Dosage Given: 2.67 Gy
Session Number: 1

## 2023-09-28 ENCOUNTER — Other Ambulatory Visit: Payer: Self-pay

## 2023-09-28 ENCOUNTER — Ambulatory Visit
Admission: RE | Admit: 2023-09-28 | Discharge: 2023-09-28 | Disposition: A | Discharge status: Home / Self Care | Source: Ambulatory Visit | Attending: Radiation Oncology | Admitting: Radiation Oncology

## 2023-09-28 ENCOUNTER — Encounter: Payer: Self-pay | Admitting: *Deleted

## 2023-09-28 DIAGNOSIS — Z170 Estrogen receptor positive status [ER+]: Secondary | ICD-10-CM

## 2023-09-28 DIAGNOSIS — Z510 Encounter for antineoplastic radiation therapy: Secondary | ICD-10-CM | POA: Diagnosis not present

## 2023-09-28 LAB — RAD ONC ARIA SESSION SUMMARY
Course Elapsed Days: 1
Plan Fractions Treated to Date: 2
Plan Prescribed Dose Per Fraction: 2.67 Gy
Plan Total Fractions Prescribed: 15
Plan Total Prescribed Dose: 40.05 Gy
Reference Point Dosage Given to Date: 5.34 Gy
Reference Point Session Dosage Given: 2.67 Gy
Session Number: 2

## 2023-09-29 ENCOUNTER — Other Ambulatory Visit: Payer: Self-pay

## 2023-09-29 ENCOUNTER — Ambulatory Visit
Admission: RE | Admit: 2023-09-29 | Discharge: 2023-09-29 | Disposition: A | Discharge status: Home / Self Care | Source: Ambulatory Visit | Attending: Radiation Oncology

## 2023-09-29 DIAGNOSIS — Z510 Encounter for antineoplastic radiation therapy: Secondary | ICD-10-CM | POA: Diagnosis not present

## 2023-09-29 LAB — RAD ONC ARIA SESSION SUMMARY
Course Elapsed Days: 2
Plan Fractions Treated to Date: 3
Plan Prescribed Dose Per Fraction: 2.67 Gy
Plan Total Fractions Prescribed: 15
Plan Total Prescribed Dose: 40.05 Gy
Reference Point Dosage Given to Date: 8.01 Gy
Reference Point Session Dosage Given: 2.67 Gy
Session Number: 3

## 2023-09-30 ENCOUNTER — Ambulatory Visit
Admission: RE | Admit: 2023-09-30 | Discharge: 2023-09-30 | Disposition: A | Discharge status: Home / Self Care | Source: Ambulatory Visit | Attending: Radiation Oncology | Admitting: Radiation Oncology

## 2023-09-30 ENCOUNTER — Other Ambulatory Visit: Payer: Self-pay

## 2023-09-30 DIAGNOSIS — Z510 Encounter for antineoplastic radiation therapy: Secondary | ICD-10-CM | POA: Diagnosis not present

## 2023-09-30 LAB — RAD ONC ARIA SESSION SUMMARY
Course Elapsed Days: 3
Plan Fractions Treated to Date: 4
Plan Prescribed Dose Per Fraction: 2.67 Gy
Plan Total Fractions Prescribed: 15
Plan Total Prescribed Dose: 40.05 Gy
Reference Point Dosage Given to Date: 10.68 Gy
Reference Point Session Dosage Given: 2.67 Gy
Session Number: 4

## 2023-10-04 ENCOUNTER — Other Ambulatory Visit: Payer: Self-pay

## 2023-10-04 ENCOUNTER — Ambulatory Visit
Admission: RE | Admit: 2023-10-04 | Discharge: 2023-10-04 | Disposition: A | Discharge status: Home / Self Care | Source: Ambulatory Visit | Attending: Radiation Oncology | Admitting: Radiation Oncology

## 2023-10-04 ENCOUNTER — Ambulatory Visit
Admission: RE | Admit: 2023-10-04 | Discharge: 2023-10-04 | Disposition: A | Discharge status: Home / Self Care | Source: Ambulatory Visit | Attending: Radiation Oncology

## 2023-10-04 DIAGNOSIS — N951 Menopausal and female climacteric states: Secondary | ICD-10-CM | POA: Diagnosis not present

## 2023-10-04 DIAGNOSIS — C50412 Malignant neoplasm of upper-outer quadrant of left female breast: Secondary | ICD-10-CM | POA: Diagnosis present

## 2023-10-04 DIAGNOSIS — Z170 Estrogen receptor positive status [ER+]: Secondary | ICD-10-CM | POA: Diagnosis not present

## 2023-10-04 DIAGNOSIS — Z510 Encounter for antineoplastic radiation therapy: Secondary | ICD-10-CM | POA: Diagnosis present

## 2023-10-04 LAB — RAD ONC ARIA SESSION SUMMARY
Course Elapsed Days: 7
Plan Fractions Treated to Date: 5
Plan Prescribed Dose Per Fraction: 2.67 Gy
Plan Total Fractions Prescribed: 15
Plan Total Prescribed Dose: 40.05 Gy
Reference Point Dosage Given to Date: 13.35 Gy
Reference Point Session Dosage Given: 2.67 Gy
Session Number: 5

## 2023-10-05 ENCOUNTER — Other Ambulatory Visit: Payer: Self-pay

## 2023-10-05 ENCOUNTER — Encounter: Admitting: Family

## 2023-10-05 ENCOUNTER — Ambulatory Visit
Admission: RE | Admit: 2023-10-05 | Discharge: 2023-10-05 | Disposition: A | Discharge status: Home / Self Care | Source: Ambulatory Visit | Attending: Radiation Oncology

## 2023-10-05 DIAGNOSIS — Z510 Encounter for antineoplastic radiation therapy: Secondary | ICD-10-CM | POA: Diagnosis not present

## 2023-10-05 LAB — RAD ONC ARIA SESSION SUMMARY
Course Elapsed Days: 8
Plan Fractions Treated to Date: 6
Plan Prescribed Dose Per Fraction: 2.67 Gy
Plan Total Fractions Prescribed: 15
Plan Total Prescribed Dose: 40.05 Gy
Reference Point Dosage Given to Date: 16.02 Gy
Reference Point Session Dosage Given: 2.67 Gy
Session Number: 6

## 2023-10-06 ENCOUNTER — Ambulatory Visit
Admission: RE | Admit: 2023-10-06 | Discharge: 2023-10-06 | Disposition: A | Discharge status: Home / Self Care | Source: Ambulatory Visit | Attending: Radiation Oncology | Admitting: Radiation Oncology

## 2023-10-06 ENCOUNTER — Other Ambulatory Visit: Payer: Self-pay

## 2023-10-06 DIAGNOSIS — Z510 Encounter for antineoplastic radiation therapy: Secondary | ICD-10-CM | POA: Diagnosis not present

## 2023-10-06 LAB — RAD ONC ARIA SESSION SUMMARY
Course Elapsed Days: 9
Plan Fractions Treated to Date: 7
Plan Prescribed Dose Per Fraction: 2.67 Gy
Plan Total Fractions Prescribed: 15
Plan Total Prescribed Dose: 40.05 Gy
Reference Point Dosage Given to Date: 18.69 Gy
Reference Point Session Dosage Given: 2.67 Gy
Session Number: 7

## 2023-10-07 ENCOUNTER — Ambulatory Visit
Admission: RE | Admit: 2023-10-07 | Discharge: 2023-10-07 | Disposition: A | Discharge status: Home / Self Care | Source: Ambulatory Visit | Attending: Radiation Oncology | Admitting: Radiation Oncology

## 2023-10-07 ENCOUNTER — Other Ambulatory Visit: Payer: Self-pay

## 2023-10-07 DIAGNOSIS — Z510 Encounter for antineoplastic radiation therapy: Secondary | ICD-10-CM | POA: Diagnosis not present

## 2023-10-07 LAB — RAD ONC ARIA SESSION SUMMARY
Course Elapsed Days: 10
Plan Fractions Treated to Date: 8
Plan Prescribed Dose Per Fraction: 2.67 Gy
Plan Total Fractions Prescribed: 15
Plan Total Prescribed Dose: 40.05 Gy
Reference Point Dosage Given to Date: 21.36 Gy
Reference Point Session Dosage Given: 2.67 Gy
Session Number: 8

## 2023-10-10 ENCOUNTER — Ambulatory Visit
Admission: RE | Admit: 2023-10-10 | Discharge: 2023-10-10 | Disposition: A | Discharge status: Home / Self Care | Source: Ambulatory Visit | Attending: Radiation Oncology

## 2023-10-10 ENCOUNTER — Other Ambulatory Visit: Payer: Self-pay

## 2023-10-10 DIAGNOSIS — Z510 Encounter for antineoplastic radiation therapy: Secondary | ICD-10-CM | POA: Diagnosis not present

## 2023-10-10 LAB — RAD ONC ARIA SESSION SUMMARY
Course Elapsed Days: 13
Plan Fractions Treated to Date: 9
Plan Prescribed Dose Per Fraction: 2.67 Gy
Plan Total Fractions Prescribed: 15
Plan Total Prescribed Dose: 40.05 Gy
Reference Point Dosage Given to Date: 24.03 Gy
Reference Point Session Dosage Given: 2.67 Gy
Session Number: 9

## 2023-10-11 ENCOUNTER — Other Ambulatory Visit: Payer: Self-pay

## 2023-10-11 ENCOUNTER — Ambulatory Visit
Admission: RE | Admit: 2023-10-11 | Discharge: 2023-10-11 | Disposition: A | Discharge status: Home / Self Care | Source: Ambulatory Visit | Attending: Radiation Oncology

## 2023-10-11 DIAGNOSIS — Z510 Encounter for antineoplastic radiation therapy: Secondary | ICD-10-CM | POA: Diagnosis not present

## 2023-10-11 LAB — RAD ONC ARIA SESSION SUMMARY
Course Elapsed Days: 14
Plan Fractions Treated to Date: 10
Plan Prescribed Dose Per Fraction: 2.67 Gy
Plan Total Fractions Prescribed: 15
Plan Total Prescribed Dose: 40.05 Gy
Reference Point Dosage Given to Date: 26.7 Gy
Reference Point Session Dosage Given: 2.67 Gy
Session Number: 10

## 2023-10-12 ENCOUNTER — Other Ambulatory Visit: Payer: Self-pay

## 2023-10-12 ENCOUNTER — Ambulatory Visit: Payer: Self-pay | Attending: General Surgery | Admitting: Rehabilitation

## 2023-10-12 ENCOUNTER — Ambulatory Visit
Admission: RE | Admit: 2023-10-12 | Discharge: 2023-10-12 | Disposition: A | Discharge status: Home / Self Care | Source: Ambulatory Visit | Attending: Radiation Oncology

## 2023-10-12 ENCOUNTER — Encounter: Payer: Self-pay | Admitting: Rehabilitation

## 2023-10-12 DIAGNOSIS — C50412 Malignant neoplasm of upper-outer quadrant of left female breast: Secondary | ICD-10-CM | POA: Insufficient documentation

## 2023-10-12 DIAGNOSIS — Z170 Estrogen receptor positive status [ER+]: Secondary | ICD-10-CM | POA: Insufficient documentation

## 2023-10-12 DIAGNOSIS — Z510 Encounter for antineoplastic radiation therapy: Secondary | ICD-10-CM | POA: Diagnosis not present

## 2023-10-12 DIAGNOSIS — Z483 Aftercare following surgery for neoplasm: Secondary | ICD-10-CM | POA: Insufficient documentation

## 2023-10-12 DIAGNOSIS — Z9189 Other specified personal risk factors, not elsewhere classified: Secondary | ICD-10-CM | POA: Insufficient documentation

## 2023-10-12 LAB — RAD ONC ARIA SESSION SUMMARY
Course Elapsed Days: 15
Plan Fractions Treated to Date: 11
Plan Prescribed Dose Per Fraction: 2.67 Gy
Plan Total Fractions Prescribed: 15
Plan Total Prescribed Dose: 40.05 Gy
Reference Point Dosage Given to Date: 29.37 Gy
Reference Point Session Dosage Given: 2.67 Gy
Session Number: 11

## 2023-10-12 NOTE — Therapy (Signed)
 OUTPATIENT PHYSICAL THERAPY SOZO SCREENING NOTE   Patient Name: Amber Stephens MRN: [id number] DOB:05-04-1975, 48 y.o., female Today's Date: 10/12/2023  PCP: Daryl Setter, NP REFERRING PROVIDER: Aron Shoulders, MD   PT End of Session - 10/12/23 1012     Visit Number 2   screen   PT Start Time 1000    PT Stop Time 1010    PT Time Calculation (min) 10 min    Activity Tolerance Patient tolerated treatment well    Behavior During Therapy WFL for tasks assessed/performed          Past Medical History:  Diagnosis Date   Allergic rhinitis    Allergy    As a child   Anemia    borderline, prior to hysterectomy   Asthma    Breast cancer (HCC) 2025   Cancer (HCC)    Breast   Fatigue    History of pneumonia age 45 and agin   my mom said at age 10 and again at age 47    Hypertension 05/28/2020   Multiple food allergies    Shortness of breath    Shortness of breath on exertion    Past Surgical History:  Procedure Laterality Date   ABDOMINAL HYSTERECTOMY N/A 06/16/2017   Procedure: HYSTERECTOMY ABDOMINAL TOTAL;  Surgeon: Rosalynn LELON Ingle, MD;  Location: [facility];  Service: Gynecology;  Laterality: N/A;   BILATERAL SALPINGECTOMY Bilateral 06/16/2017   Procedure: BILATERAL SALPINGECTOMY;  Surgeon: Rosalynn LELON Ingle, MD;  Location: [facility];  Service: Gynecology;  Laterality: Bilateral;   BREAST BIOPSY Left 06/06/2023   US  LT BREAST BX W LOC DEV 1ST LESION IMG BX SPEC US  GUIDE 06/06/2023 [facility]   BREAST BIOPSY  07/08/2023   MM LT RADIOACTIVE SEED LOC MAMMO GUIDE 07/08/2023 [facility] MAMMOGR[facility]   BREAST LUMPECTOMY Left 07/20/2023   Procedure: BREAST LUMPECTOMY;  Surgeon: Aron Shoulders, MD;  Location: [facility];  Service: General;  Laterality: Left;  RE-EXCISION LUMPECTOMY LEFT BREAST   BREAST LUMPECTOMY Left 08/16/2023   Procedure: REEXCISION LEFT BREAST LUMPECTOMY;  Surgeon: Aron Shoulders, MD;  Location: [facility];  Service: General;  Laterality: Left;  RE-EXCISION LEFT LUMPECTOMY   BREAST LUMPECTOMY WITH RADIOACTIVE SEED AND SENTINEL LYMPH NODE BIOPSY Left 07/12/2023   Procedure: RADIOACTIVE SEED GUIDED LEFT BREAST LUMPECTOMY WITH LEFT AXILLARY SENTINEL LYMPH NODE BIOPSY;  Surgeon: Aron Shoulders, MD;  Location: [facility];  Service: General;  Laterality: Left;   BTL  11/2008   Dr Marget; bilateral tubal ligation    CESAREAN SECTION     x4 , with each child , has 4 children    DILATION AND CURETTAGE OF UTERUS  07/02/2012   MYOMECTOMY  2001   Dr Ingle [facility]ynn at [facility]    TUBAL LIGATION  2010   Patient Active Problem List   Diagnosis Date Noted   Genetic testing 06/24/2023   Malignant neoplasm of upper-outer quadrant of left breast in female, estrogen receptor positive (HCC) 06/13/2023   Generalized obesity 03/03/2022   Abnormal craving 10/05/2021   Other hyperlipidemia 08/05/2021   Prediabetes 03/30/2021   Vitamin D  insufficiency 03/30/2021   Obesity (BMI 30.0-34.9) 12/03/2020   Hypertension 05/28/2020   S/P TAH (total abdominal hysterectomy) 06/16/2017   Physical exam, annual 10/30/2012   Allergic rhinitis 05/15/2007   Asthma 05/15/2007    REFERRING DIAG: left breast cancer at risk for lymphedema  THERAPY DIAG:  Malignant neoplasm of upper-outer quadrant of left  breast in female, estrogen receptor positive (HCC)  Aftercare following surgery for neoplasm  At risk for lymphedema  PERTINENT HISTORY: Patient was diagnosed on 05/18/2023 with left grade 2 invasive ductal carcinoma breast cancer. It measured 1.5 cm and was located in the upper outer quadrant. It is ER/PR positive and HER2 negative with a Ki67 of 1%. Lumpectomy 07/12/23 with 2 negative nodes removed.  Re-excision 07/20/23. Will have radiation.   PRECAUTIONS: left UE Lymphedema risk, None  SUBJECTIVE: doing well.   PAIN:  Are you having pain? No  SOZO SCREENING: Patient was assessed today  using the SOZO machine to determine the lymphedema index score. This was compared to her baseline score. It was determined that she is within the recommended range when compared to her baseline and no further action is needed at this time. She will continue SOZO screenings. These are done every 3 months for 2 years post operatively followed by every 6 months for 2 years, and then annually.   Larue Saddie SAUNDERS, PT 10/12/2023, 10:13 AM

## 2023-10-13 ENCOUNTER — Other Ambulatory Visit: Payer: Self-pay

## 2023-10-13 ENCOUNTER — Ambulatory Visit
Admission: RE | Admit: 2023-10-13 | Discharge: 2023-10-13 | Disposition: A | Discharge status: Home / Self Care | Source: Ambulatory Visit | Attending: Radiation Oncology | Admitting: Radiation Oncology

## 2023-10-13 DIAGNOSIS — Z510 Encounter for antineoplastic radiation therapy: Secondary | ICD-10-CM | POA: Diagnosis not present

## 2023-10-13 LAB — RAD ONC ARIA SESSION SUMMARY
Course Elapsed Days: 16
Plan Fractions Treated to Date: 12
Plan Prescribed Dose Per Fraction: 2.67 Gy
Plan Total Fractions Prescribed: 15
Plan Total Prescribed Dose: 40.05 Gy
Reference Point Dosage Given to Date: 32.04 Gy
Reference Point Session Dosage Given: 2.67 Gy
Session Number: 12

## 2023-10-14 ENCOUNTER — Ambulatory Visit
Admission: RE | Admit: 2023-10-14 | Discharge: 2023-10-14 | Disposition: A | Discharge status: Home / Self Care | Source: Ambulatory Visit | Attending: Radiation Oncology | Admitting: Radiation Oncology

## 2023-10-14 ENCOUNTER — Other Ambulatory Visit: Payer: Self-pay

## 2023-10-14 DIAGNOSIS — Z510 Encounter for antineoplastic radiation therapy: Secondary | ICD-10-CM | POA: Diagnosis not present

## 2023-10-14 LAB — RAD ONC ARIA SESSION SUMMARY
Course Elapsed Days: 17
Plan Fractions Treated to Date: 13
Plan Prescribed Dose Per Fraction: 2.67 Gy
Plan Total Fractions Prescribed: 15
Plan Total Prescribed Dose: 40.05 Gy
Reference Point Dosage Given to Date: 34.71 Gy
Reference Point Session Dosage Given: 2.67 Gy
Session Number: 13

## 2023-10-17 ENCOUNTER — Ambulatory Visit: Admitting: Radiation Oncology

## 2023-10-17 ENCOUNTER — Ambulatory Visit
Admission: RE | Admit: 2023-10-17 | Discharge: 2023-10-17 | Disposition: A | Discharge status: Home / Self Care | Source: Ambulatory Visit | Attending: Radiation Oncology | Admitting: Radiation Oncology

## 2023-10-17 ENCOUNTER — Other Ambulatory Visit: Payer: Self-pay

## 2023-10-17 DIAGNOSIS — Z510 Encounter for antineoplastic radiation therapy: Secondary | ICD-10-CM | POA: Diagnosis not present

## 2023-10-17 LAB — RAD ONC ARIA SESSION SUMMARY
Course Elapsed Days: 20
Plan Fractions Treated to Date: 14
Plan Prescribed Dose Per Fraction: 2.67 Gy
Plan Total Fractions Prescribed: 15
Plan Total Prescribed Dose: 40.05 Gy
Reference Point Dosage Given to Date: 37.38 Gy
Reference Point Session Dosage Given: 2.67 Gy
Session Number: 14

## 2023-10-18 ENCOUNTER — Other Ambulatory Visit: Payer: Self-pay

## 2023-10-18 ENCOUNTER — Ambulatory Visit
Admission: RE | Admit: 2023-10-18 | Discharge: 2023-10-18 | Disposition: A | Discharge status: Home / Self Care | Source: Ambulatory Visit | Attending: Radiation Oncology | Admitting: Radiation Oncology

## 2023-10-18 DIAGNOSIS — Z510 Encounter for antineoplastic radiation therapy: Secondary | ICD-10-CM | POA: Diagnosis not present

## 2023-10-18 LAB — RAD ONC ARIA SESSION SUMMARY
Course Elapsed Days: 21
Plan Fractions Treated to Date: 15
Plan Prescribed Dose Per Fraction: 2.67 Gy
Plan Total Fractions Prescribed: 15
Plan Total Prescribed Dose: 40.05 Gy
Reference Point Dosage Given to Date: 40.05 Gy
Reference Point Session Dosage Given: 2.67 Gy
Session Number: 15

## 2023-10-19 ENCOUNTER — Other Ambulatory Visit: Payer: Self-pay

## 2023-10-19 ENCOUNTER — Ambulatory Visit
Admission: RE | Admit: 2023-10-19 | Discharge: 2023-10-19 | Disposition: A | Discharge status: Home / Self Care | Source: Ambulatory Visit | Attending: Radiation Oncology | Admitting: Radiation Oncology

## 2023-10-19 ENCOUNTER — Encounter: Payer: Self-pay | Admitting: Nurse Practitioner

## 2023-10-19 ENCOUNTER — Ambulatory Visit: Admitting: Nurse Practitioner

## 2023-10-19 VITALS — BP 120/81 | HR 68 | Temp 98.1°F | Ht 62.0 in | Wt 184.0 lb

## 2023-10-19 DIAGNOSIS — Z6833 Body mass index (BMI) 33.0-33.9, adult: Secondary | ICD-10-CM | POA: Diagnosis not present

## 2023-10-19 DIAGNOSIS — R632 Polyphagia: Secondary | ICD-10-CM | POA: Diagnosis not present

## 2023-10-19 DIAGNOSIS — E669 Obesity, unspecified: Secondary | ICD-10-CM | POA: Diagnosis not present

## 2023-10-19 DIAGNOSIS — E66811 Obesity, class 1: Secondary | ICD-10-CM

## 2023-10-19 DIAGNOSIS — Z510 Encounter for antineoplastic radiation therapy: Secondary | ICD-10-CM | POA: Diagnosis not present

## 2023-10-19 LAB — RAD ONC ARIA SESSION SUMMARY
Course Elapsed Days: 22
Plan Fractions Treated to Date: 1
Plan Prescribed Dose Per Fraction: 2 Gy
Plan Total Fractions Prescribed: 5
Plan Total Prescribed Dose: 10 Gy
Reference Point Dosage Given to Date: 2 Gy
Reference Point Session Dosage Given: 2 Gy
Session Number: 16

## 2023-10-19 MED ORDER — LOMAIRA 8 MG PO TABS: 1.0000 | ORAL_TABLET | Freq: Two times a day (BID) | ORAL | 0 refills | Status: DC

## 2023-10-19 NOTE — Progress Notes (Signed)
 Office: [phone]  /  Fax: [phone]  WEIGHT SUMMARY AND BIOMETRICS  Weight Lost Since Last Visit: 2lb  Weight Gained Since Last Visit: 0lb   Vitals Temp: 98.1 F (36.7 C) BP: 120/81 Pulse Rate: 68 SpO2: 100 %   Anthropometric Measurements Height: 5' 2 (1.575 m) Weight: 184 lb (83.5 kg) BMI (Calculated): 33.65 Weight at Last Visit: 186lb Weight Lost Since Last Visit: 2lb Weight Gained Since Last Visit: 0lb Starting Weight: 205lb Total Weight Loss (lbs): 21 lb (9.526 kg)   Body Composition  Body Fat %: 35.8 % Fat Mass (lbs): 66.2 lbs Muscle Mass (lbs): 112.6 lbs Total Body Water (lbs): 74.2 lbs Visceral Fat Rating : 9   Other Clinical Data Fasting: Yes Labs: No Today's Visit #: 9 Starting Date: 03/26/21     HPI  Chief Complaint: OBESITY  Ndea is here to discuss her progress with her obesity treatment plan. She is on the the Category 2 Plan and states she is following her eating plan approximately 80 % of the time. She states she is exercising 30 minutes 4 days per week.   Interval History:  Since last office visit she has lost 2 pounds.  She has had 16 radiation treatments since her last visit-has 4 more treatments left. She was told by oncology to not lose more than 1-2 lbs per week while doing radiation treatments.  They are aware that she is taking Lomaira .  She hasn't been tracking since starting treatments.  She is drinking water and a protein shake daily.   She is walking daily for 30-40 minutes.   Pharmacotherapy for weight loss: She is currently taking Lomaira  8mg  BID for medical weight loss.  Denies side effects.     Previous pharmacotherapy for medical weight loss:  Lomaira  & Wellbutrin  (stopped due to tremors)    Bariatric surgery:  Patient has not had bariatric surgery          PHYSICAL EXAM:  Blood pressure 120/81, pulse 68, temperature 98.1 F (36.7 C), height 5' 2 (1.575 m), weight 184 lb (83.5 kg), last menstrual period  06/04/2017, SpO2 100%. Body mass index is 33.65 kg/m.  General: She is overweight, cooperative, alert, well developed, and in no acute distress. PSYCH: Has normal mood, affect and thought process.   Extremities: No edema.  Neurologic: No gross sensory or motor deficits. No tremors or fasciculations noted.    DIAGNOSTIC DATA REVIEWED:  BMET    Component Value Date/Time   NA 140 06/15/2023 1220   NA 139 05/04/2023 1218   K 4.2 06/15/2023 1220   CL 106 06/15/2023 1220   CO2 29 06/15/2023 1220   GLUCOSE 96 06/15/2023 1220   BUN 20 06/15/2023 1220   BUN 13 05/04/2023 1218   CREATININE 0.94 06/15/2023 1220   CALCIUM 9.6 06/15/2023 1220   GFRNONAA >60 06/15/2023 1220   GFRAA >60 06/07/2017 0915   Lab Results  Component Value Date   HGBA1C 5.8 (H) 03/16/2023   HGBA1C 5.7 (H) 03/26/2021   Lab Results  Component Value Date   INSULIN  8.3 03/16/2023   INSULIN  19.1 03/26/2021   Lab Results  Component Value Date   TSH 0.741 03/16/2023   CBC    Component Value Date/Time   WBC 6.2 06/15/2023 1220   WBC 5.1 04/03/2019 1005   RBC 4.70 06/15/2023 1220   HGB 13.7 06/15/2023 1220   HGB 13.9 03/16/2023 1050   HCT 40.8 06/15/2023 1220   HCT 41.7 03/16/2023 1050   PLT  357 06/15/2023 1220   PLT 366 03/16/2023 1050   MCV 86.8 06/15/2023 1220   MCV 90 03/16/2023 1050   MCH 29.1 06/15/2023 1220   MCHC 33.6 06/15/2023 1220   RDW 13.3 06/15/2023 1220   RDW 12.4 03/16/2023 1050   Iron Studies    Component Value Date/Time   IRON 106 10/30/2012 1137   IRONPCTSAT 28.6 10/30/2012 1137   Lipid Panel     Component Value Date/Time   CHOL 160 03/16/2023 1050   TRIG 69 03/16/2023 1050   HDL 53 03/16/2023 1050   CHOLHDL 3 05/28/2020 0831   VLDL 11.4 05/28/2020 0831   LDLCALC 94 03/16/2023 1050   Hepatic Function Panel     Component Value Date/Time   PROT 7.7 06/15/2023 1220   PROT 7.2 03/16/2023 1050   ALBUMIN 4.5 06/15/2023 1220   ALBUMIN 4.4 03/16/2023 1050   AST 18  06/15/2023 1220   ALT 18 06/15/2023 1220   ALKPHOS 48 06/15/2023 1220   BILITOT 0.4 06/15/2023 1220   BILIDIR 0.1 04/03/2019 1005      Component Value Date/Time   TSH 0.741 03/16/2023 1050   Nutritional Lab Results  Component Value Date   VD25OH 39.0 03/16/2023   VD25OH 83.1 03/03/2022   VD25OH 72.4 08/05/2021     ASSESSMENT AND PLAN  TREATMENT PLAN FOR OBESITY:  Recommended Dietary Goals  Tiarrah is currently in the action stage of change. As such, her goal is to continue weight management plan. She has agreed to practicing portion control and making smarter food choices, such as increasing vegetables and decreasing simple carbohydrates.  Behavioral Intervention  We discussed the following Behavioral Modification Strategies today: increasing lean protein intake to established goals, decreasing simple carbohydrates , increasing vegetables, increasing fiber rich foods, increasing water intake , work on meal planning and preparation, reading food labels , keeping healthy foods at home, continue to work on maintaining a reduced calorie state, getting the recommended amount of protein, incorporating whole foods, making healthy choices, staying well hydrated and practicing mindfulness when eating., and increase protein intake, fibrous foods (25 grams per day for women, 30 grams for men) and water to improve satiety and decrease hunger signals. .  Additional resources provided today: NA  Recommended Physical Activity Goals  Neva has been advised to work up to 150 minutes of moderate intensity aerobic activity a week and strengthening exercises 2-3 times per week for cardiovascular health, weight loss maintenance and preservation of muscle mass.   She has agreed to Think about enjoyable ways to increase daily physical activity and overcoming barriers to exercise, Increase physical activity in their day and reduce sedentary time (increase NEAT)., Continue to gradually increase the  amount and intensity of exercise routine, Increase volume of physical activity to a goal of 240 minutes a week, and Combine aerobic and strengthening exercises for efficiency and improved cardiometabolic health.   Pharmacotherapy We discussed various medication options to help Denyce with her weight loss efforts and we both agreed to continue Lomaira  8mg  BID.  Side effects discussed.  To continue to also discuss with oncology.    ASSOCIATED CONDITIONS ADDRESSED TODAY  Action/Plan  Polyphagia -     Lomaira ; Take 1 tablet (8 mg total) by mouth 2 (two) times daily.  Dispense: 60 tablet; Refill: 0  Obesity (BMI 30.0-34.9) -     Lomaira ; Take 1 tablet (8 mg total) by mouth 2 (two) times daily.  Dispense: 60 tablet; Refill: 0  Return in about 6 weeks (around 11/30/2023).SABRA She was informed of the importance of frequent follow up visits to maximize her success with intensive lifestyle modifications for her multiple health conditions.   ATTESTASTION STATEMENTS:  Reviewed by clinician on day of visit: allergies, medications, problem list, medical history, surgical history, family history, social history, and previous encounter notes.   Corean SAUNDERS. [place]oteau FNP-C

## 2023-10-20 ENCOUNTER — Ambulatory Visit
Admission: RE | Admit: 2023-10-20 | Discharge: 2023-10-20 | Disposition: A | Discharge status: Home / Self Care | Source: Ambulatory Visit | Attending: Radiation Oncology

## 2023-10-20 ENCOUNTER — Other Ambulatory Visit: Payer: Self-pay

## 2023-10-20 DIAGNOSIS — Z510 Encounter for antineoplastic radiation therapy: Secondary | ICD-10-CM | POA: Diagnosis not present

## 2023-10-20 LAB — RAD ONC ARIA SESSION SUMMARY
Course Elapsed Days: 23
Plan Fractions Treated to Date: 2
Plan Prescribed Dose Per Fraction: 2 Gy
Plan Total Fractions Prescribed: 5
Plan Total Prescribed Dose: 10 Gy
Reference Point Dosage Given to Date: 4 Gy
Reference Point Session Dosage Given: 2 Gy
Session Number: 17

## 2023-10-21 ENCOUNTER — Ambulatory Visit
Admission: RE | Admit: 2023-10-21 | Discharge: 2023-10-21 | Disposition: A | Discharge status: Home / Self Care | Source: Ambulatory Visit | Attending: Radiation Oncology | Admitting: Radiation Oncology

## 2023-10-21 ENCOUNTER — Other Ambulatory Visit: Payer: Self-pay

## 2023-10-21 DIAGNOSIS — Z510 Encounter for antineoplastic radiation therapy: Secondary | ICD-10-CM | POA: Diagnosis not present

## 2023-10-21 LAB — RAD ONC ARIA SESSION SUMMARY
Course Elapsed Days: 24
Plan Fractions Treated to Date: 3
Plan Prescribed Dose Per Fraction: 2 Gy
Plan Total Fractions Prescribed: 5
Plan Total Prescribed Dose: 10 Gy
Reference Point Dosage Given to Date: 6 Gy
Reference Point Session Dosage Given: 2 Gy
Session Number: 18

## 2023-10-24 ENCOUNTER — Ambulatory Visit
Admission: RE | Admit: 2023-10-24 | Discharge: 2023-10-24 | Disposition: A | Discharge status: Home / Self Care | Source: Ambulatory Visit | Attending: Radiation Oncology | Admitting: Radiation Oncology

## 2023-10-24 ENCOUNTER — Other Ambulatory Visit: Payer: Self-pay

## 2023-10-24 ENCOUNTER — Ambulatory Visit
Admission: RE | Admit: 2023-10-24 | Discharge: 2023-10-24 | Disposition: A | Discharge status: Home / Self Care | Source: Ambulatory Visit | Attending: Radiation Oncology

## 2023-10-24 DIAGNOSIS — Z510 Encounter for antineoplastic radiation therapy: Secondary | ICD-10-CM | POA: Diagnosis not present

## 2023-10-24 LAB — RAD ONC ARIA SESSION SUMMARY
Course Elapsed Days: 27
Plan Fractions Treated to Date: 4
Plan Prescribed Dose Per Fraction: 2 Gy
Plan Total Fractions Prescribed: 5
Plan Total Prescribed Dose: 10 Gy
Reference Point Dosage Given to Date: 8 Gy
Reference Point Session Dosage Given: 2 Gy
Session Number: 19

## 2023-10-24 NOTE — Progress Notes (Signed)
 This encounter was created in error - please disregard.

## 2023-10-24 NOTE — Progress Notes (Signed)
 SABRA

## 2023-10-25 ENCOUNTER — Ambulatory Visit
Admission: RE | Admit: 2023-10-25 | Discharge: 2023-10-25 | Disposition: A | Discharge status: Home / Self Care | Source: Ambulatory Visit | Attending: Radiation Oncology

## 2023-10-25 ENCOUNTER — Inpatient Hospital Stay: Attending: Licensed Clinical Social Worker | Admitting: Hematology and Oncology

## 2023-10-25 ENCOUNTER — Inpatient Hospital Stay

## 2023-10-25 ENCOUNTER — Other Ambulatory Visit: Payer: Self-pay

## 2023-10-25 VITALS — BP 132/85 | HR 78 | Temp 98.7°F | Resp 16 | Wt 189.6 lb

## 2023-10-25 DIAGNOSIS — C50412 Malignant neoplasm of upper-outer quadrant of left female breast: Secondary | ICD-10-CM | POA: Diagnosis not present

## 2023-10-25 DIAGNOSIS — Z510 Encounter for antineoplastic radiation therapy: Secondary | ICD-10-CM | POA: Diagnosis not present

## 2023-10-25 DIAGNOSIS — Z170 Estrogen receptor positive status [ER+]: Secondary | ICD-10-CM

## 2023-10-25 LAB — RAD ONC ARIA SESSION SUMMARY
Course Elapsed Days: 28
Plan Fractions Treated to Date: 5
Plan Prescribed Dose Per Fraction: 2 Gy
Plan Total Fractions Prescribed: 5
Plan Total Prescribed Dose: 10 Gy
Reference Point Dosage Given to Date: 10 Gy
Reference Point Session Dosage Given: 2 Gy
Session Number: 20

## 2023-10-25 NOTE — Assessment & Plan Note (Signed)
 06/06/2023:Screening mammogram detected left breast asymmetry and distortion at 12:00 spiculated mass by ultrasound measuring 1.5 cm, biopsy grade 2 IDC ER 100% PR 100% Ki67 1%, HER2 negative    Recommendations: 1. Breast conserving surgery: 07/12/23: Grade 2 IDC with DCIS 2.2 cm 0/1 LN Neg,  Margin Re-excision: Positive for DCIS (margin still positive) Margin reexcision planned for July 15 2. Oncotype DX testing score 9 (6% risk of distant recurrence): No role of chemotherapy 3. Adjuvant radiation therapy 09/28/2023-10/25/2023 4. Adjuvant antiestrogen therapy: Tamoxifen to start 11/02/2023 5.  Genetic testing: 06/27/2023: Negative ------------------------------------- Tamoxifen counseling: We discussed the risks and benefits of tamoxifen. These include but not limited to insomnia, hot flashes, mood changes, vaginal dryness, and weight gain. Although rare, serious side effects including endometrial cancer, risk of blood clots were also discussed. We strongly believe that the benefits far outweigh the risks. Patient understands these risks and consented to starting treatment. Planned treatment duration is 5-10 years.  Return to clinic in 3 months for survivorship care plan was

## 2023-10-25 NOTE — Progress Notes (Signed)
 Patient Care Team: Daryl Setter, NP as PCP - General (Internal Medicine) Marget Lenis, MD as Consulting Physician (Obstetrics and Gynecology) Tyree Nanetta SAILOR, RN as Oncology Nurse Navigator Odean Potts, MD as Consulting Physician (Hematology and Oncology) Aron Shoulders, MD as Consulting Physician (General Surgery) Izell Domino, MD as Attending Physician (Radiation Oncology)  DIAGNOSIS:  Encounter Diagnosis  Name Primary?   Malignant neoplasm of upper-outer quadrant of left breast in female, estrogen receptor positive (HCC) Yes    SUMMARY OF ONCOLOGIC HISTORY: Oncology History  Malignant neoplasm of upper-outer quadrant of left breast in female, estrogen receptor positive (HCC)  06/06/2023 Initial Diagnosis   Screening mammogram detected left breast asymmetry and distortion at 12:00 spiculated mass by ultrasound measuring 1.5 cm, biopsy grade 2 IDC ER 100% PR 100% Ki67 1%, HER2 negative   06/15/2023 Cancer Staging   Staging form: Breast, AJCC 8th Edition - Clinical: Stage IA (cT1c, cN0, cM0, G2, ER+, PR+, HER2-) - Signed by Odean Potts, MD on 06/15/2023 Stage prefix: Initial diagnosis Histologic grading system: 3 grade system    Genetic Testing   [facility] CancerNext-Expanded Panel+RNA was Negative. Report date is 06/27/2023.   The CancerNext-Expanded gene panel offered by [facility] and includes sequencing, rearrangement, and RNA analysis for the following 77 genes: AIP, ALK, APC, ATM, AXIN2, BAP1, BARD1, BMPR1A, BRCA1, BRCA2, BRIP1, CDC73, CDH1, CDK4, CDKN1B, CDKN2A, CEBPA, CHEK2, CTNNA1, DDX41, DICER1, ETV6, FH, FLCN, GATA2, LZTR1, MAX, MBD4, MEN1, MET, MLH1, MSH2, MSH3, MSH6, MUTYH, NF1, NF2, NTHL1, PALB2, PHOX2B, PMS2, POT1, PRKAR1A, PTCH1, PTEN, RAD51C, RAD51D, RB1, RET, RPS20, RUNX1, SDHA, SDHAF2, SDHB, SDHC, SDHD, SMAD4, SMARCA4, SMARCB1, SMARCE1, STK11, SUFU, TMEM127, TP53, TSC1, TSC2, VHL, and WT1 (sequencing and deletion/duplication); EGFR, HOXB13, KIT, MITF,  PDGFRA, POLD1, and POLE (sequencing only); EPCAM and GREM1 (deletion/duplication only).       CHIEF COMPLIANT: Follow-up after radiation is completed  HISTORY OF PRESENT ILLNESS: History of Present Illness Amber Stephens is a 48 year old female who presents for follow-up after completing radiation therapy.  She completed radiation therapy today with skin darkening as the primary side effect and no severe skin reactions. She experiences manageable hot flashes, particularly at night, and is uncertain about her menopausal status due to the absence of a uterus. She expresses concern about potential side effects of tamoxifen, including hot flashes and the risk of blood clots. Vaginal dryness and joint pain are not significant issues. She is aware that uterine changes are not a concern due to her hysterectomy.     ALLERGIES:  is allergic to grass pollen(k-o-r-t-swt vern), phenol, other, and wellbutrin  [bupropion ].  MEDICATIONS:  Current Outpatient Medications  Medication Sig Dispense Refill   albuterol  (PROAIR  HFA) 108 (90 Base) MCG/ACT inhaler USE 1-2 PUFFS BY MOUTH EVERY 4 HOURS AS NEEDED wheezing and shortness of breath 3 each 0   amLODipine  (NORVASC ) 5 MG tablet TAKE 1 TABLET BY MOUTH EVERY DAY 90 tablet 1   montelukast  (SINGULAIR ) 10 MG tablet Take 10 mg by mouth at bedtime.     oxyCODONE  (OXY IR/ROXICODONE ) 5 MG immediate release tablet Take 1 tablet (5 mg total) by mouth every 6 (six) hours as needed for severe pain (pain score 7-10). (Patient not taking: Reported on 09/21/2023) 8 tablet 0   Phentermine  HCl (LOMAIRA ) 8 MG TABS Take 1 tablet (8 mg total) by mouth 2 (two) times daily. 60 tablet 0   No current facility-administered medications for this visit.    PHYSICAL EXAMINATION: ECOG PERFORMANCE STATUS: 1 - Symptomatic but  completely ambulatory  Vitals:   10/25/23 1458  BP: 132/85  Pulse: 78  Resp: 16  Temp: 98.7 F (37.1 C)  SpO2: 100%   Filed Weights   10/25/23 1458   Weight: 189 lb 9.6 oz (86 kg)   Breast: Erythema underneath the breasts with skin breakdown  LABORATORY DATA:  I have reviewed the data as listed    Latest Ref Rng & Units 06/15/2023   12:20 PM 05/04/2023   12:18 PM 03/16/2023   10:50 AM  CMP  Glucose 70 - 99 mg/dL 96  82  91   BUN 6 - 20 mg/dL 20  13  16    Creatinine 0.44 - 1.00 mg/dL 9.05  8.99  8.86   Sodium 135 - 145 mmol/L 140  139  139   Potassium 3.5 - 5.1 mmol/L 4.2  4.7  4.6   Chloride 98 - 111 mmol/L 106  102  101   CO2 22 - 32 mmol/L 29  25  23    Calcium 8.9 - 10.3 mg/dL 9.6  9.5  9.4   Total Protein 6.5 - 8.1 g/dL 7.7   7.2   Total Bilirubin 0.0 - 1.2 mg/dL 0.4   0.4   Alkaline Phos 38 - 126 U/L 48   56   AST 15 - 41 U/L 18   18   ALT 0 - 44 U/L 18   15     Lab Results  Component Value Date   WBC 6.2 06/15/2023   HGB 13.7 06/15/2023   HCT 40.8 06/15/2023   MCV 86.8 06/15/2023   PLT 357 06/15/2023   NEUTROABS 3.2 06/15/2023    ASSESSMENT & PLAN:  Malignant neoplasm of upper-outer quadrant of left breast in female, estrogen receptor positive (HCC) 06/06/2023:Screening mammogram detected left breast asymmetry and distortion at 12:00 spiculated mass by ultrasound measuring 1.5 cm, biopsy grade 2 IDC ER 100% PR 100% Ki67 1%, HER2 negative    Recommendations: 1. Breast conserving surgery: 07/12/23: Grade 2 IDC with DCIS 2.2 cm 0/1 LN Neg,  Margin Re-excision: Positive for DCIS (margin still positive) Margin reexcision planned for July 15 2. Oncotype DX testing score 9 (6% risk of distant recurrence): No role of chemotherapy 3. Adjuvant radiation therapy 09/28/2023-10/25/2023 4. Adjuvant antiestrogen therapy: Tamoxifen to start 11/02/2023 5.  Genetic testing: 06/27/2023: Negative ------------------------------------- Tamoxifen counseling: We discussed the risks and benefits of tamoxifen. These include but not limited to insomnia, hot flashes, mood changes, vaginal dryness, and weight gain. Although rare, serious side  effects including endometrial cancer, risk of blood clots were also discussed. We strongly believe that the benefits far outweigh the risks. Patient understands these risks and consented to starting treatment. Planned treatment duration is 5-10 years.  Return to clinic in 3 months for survivorship care plan was  ------------------------------------- Assessment and Plan Assessment & Plan Malignant neoplasm of upper-outer quadrant of left breast, female Completed surgery and radiation therapy for estrogen receptor-positive breast cancer. Menopausal status will guide adjuvant endocrine therapy choice between tamoxifen and anastrozole. Discussed side effects of both medications, including hot flashes, mood swings, joint pain, blood clots, uterine changes, bone loss, and increased cholesterol. Vaginal dryness can be managed with topical estrogen creams. - Order FSH and estradiol  levels to determine menopausal status. - Discuss results and treatment plan in two weeks. - Schedule survivorship visit in three months with Morna. - Plan follow-up visit in six months after survivorship visit. - Schedule annual follow-up visits thereafter.  Hot flashes Experiencing hot flashes,  possibly indicating menopausal transition. Current hot flashes are manageable without medication. - Monitor hot flashes and assess any changes after starting endocrine therapy.      No orders of the defined types were placed in this encounter.  The patient has a good understanding of the overall plan. she agrees with it. she will call with any problems that may develop before the next visit here. Total time spent: 30 mins including face to face time and time spent for planning, charting and co-ordination of care   Amber MARLA Chad, MD 10/25/23

## 2023-10-26 LAB — FOLLICLE STIMULATING HORMONE: FSH: 68.5 m[IU]/mL

## 2023-10-26 NOTE — Radiation Completion Notes (Signed)
 Patient Name: Amber Stephens, HOLLICK MRN: [id number] Date of Birth: Oct 07, 1975 Referring Physician: ELEANOR PONTO, M.D. Date of Service: 2023-10-26 Radiation Oncologist: Lauraine Golden, M.D. [facility]                             RADIATION ONCOLOGY END OF TREATMENT NOTE     Diagnosis: C50.412 Malignant neoplasm of upper-outer quadrant of left female breast Staging on 2023-06-15: Malignant neoplasm of upper-outer quadrant of left breast in female, estrogen receptor positive (HCC) T=cT1c, N=cN0, M=cM0 Intent: Curative     ==========DELIVERED PLANS==========  First Treatment Date: 2023-09-27 Last Treatment Date: 2023-10-25   Plan Name: Breast_L_BH Site: Breast, Left Technique: 3D Mode: Photon Dose Per Fraction: 2.67 Gy Prescribed Dose (Delivered / Prescribed): 40.05 Gy / 40.05 Gy Prescribed Fxs (Delivered / Prescribed): 15 / 15   Plan Name: Brst_L_Bst_BH Site: Breast, Left Technique: 3D Mode: Photon Dose Per Fraction: 2 Gy Prescribed Dose (Delivered / Prescribed): 10 Gy / 10 Gy Prescribed Fxs (Delivered / Prescribed): 5 / 5     ==========ON TREATMENT VISIT DATES========== 2023-10-04, 2023-10-10, 2023-10-17, 2023-10-24     ==========UPCOMING VISITS========== 01/18/2024 [facility] PL[facility] VISIT [place] Morna Pickle, NP  01/11/2024 [facility] RE[facility] AT [address] Larue Saddie SAUNDERS, PT  12/14/2023 [facility] PHYSICAL PONTO ELEANOR, NP  11/30/2023 [facility] FOLLOW UP Becki Krabbe, FNP  11/24/2023 [facility] FOLLOW UP 20 Golden Lauraine, MD  11/09/2023 [facility] TELEPHONE OFFICE VISIT Odean Potts, MD        ==========APPENDIX - ON TREATMENT VISIT NOTES==========   See weekly On Treatment Notes in Epic for details in the Media tab (listed as Progress notes on the On Treatment Visit Dates listed above).

## 2023-10-28 LAB — ESTRADIOL, ULTRA SENS: Estradiol, Sensitive: 9 pg/mL

## 2023-10-29 ENCOUNTER — Encounter: Payer: Self-pay | Admitting: Radiation Oncology

## 2023-10-29 ENCOUNTER — Telehealth: Admitting: Family Medicine

## 2023-10-29 DIAGNOSIS — M79602 Pain in left arm: Secondary | ICD-10-CM | POA: Diagnosis not present

## 2023-10-29 NOTE — Patient Instructions (Signed)
 Amber Stephens, thank you for joining Roosvelt Mater, PA-C for today's virtual visit.  While this provider is not your primary care provider (PCP), if your PCP is located in our provider database this encounter information will be shared with them immediately following your visit.   A [facility] MyChart account gives you access to today's visit and all your visits, tests, and labs performed at [facility]  click here if you don't have a [facility] MyChart account or go to [email or web address]  Consent: (Patient) Amber Stephens provided verbal consent for this virtual visit at the beginning of the encounter.  Current Medications:  Current Outpatient Medications:    albuterol  (PROAIR  HFA) 108 (90 Base) MCG/ACT inhaler, USE 1-2 PUFFS BY MOUTH EVERY 4 HOURS AS NEEDED wheezing and shortness of breath, Disp: 3 each, Rfl: 0   amLODipine  (NORVASC ) 5 MG tablet, TAKE 1 TABLET BY MOUTH EVERY DAY, Disp: 90 tablet, Rfl: 1   Phentermine  HCl (LOMAIRA ) 8 MG TABS, Take 1 tablet (8 mg total) by mouth 2 (two) times daily., Disp: 60 tablet, Rfl: 0   Medications ordered in this encounter:  No orders of the defined types were placed in this encounter.    *If you need refills on other medications prior to your next appointment, please contact your pharmacy*  Follow-Up: Call back or seek an in-person evaluation if the symptoms worsen or if the condition fails to improve as anticipated.  [facility] [phone]  Other Instructions Acute Pain, Adult Acute pain is a type of sudden pain that may last for just a few days or for as long as three months. It is often related to an illness, injury, or a medical procedure. Acute pain may be mild, moderate, or severe. Pain can make it hard for you to do your daily activities. It can cause anxiety and lead to other problems if it is not treated. Treatment may not take all the pain away, but it may lessen the pain so you can move  around and tolerate it. Pain is best treated with medicines and other therapies such as distraction, meditation, oils from plants (aromatherapy), heat, and ice. Treatment depends on the cause of the pain and how severe it is. Acute pain usually goes away once your injury has healed or you are no longer ill. Follow these instructions at home: Medicines  Take over-the-counter and prescription medicines only as told by your health care provider. Take the lowest dose of medicine for the shortest amount of time needed to relieve the pain. If you are taking prescription pain medicine: Do not stop taking the medicine suddenly. Talk to your health care provider about how and when to stop taking prescription medicine. Do not take more pills than told by your health care provider even if your pain is severe. Do not take other over-the-counter pain medicines in addition to prescription pain medicine unless told by your health care provider. Keep your medicine in a safe place, away from children or anyone who could use it in a way that it was not prescribed. Ask your health care provider if the medicine prescribed to you requires you to avoid driving or using machinery. Managing pain, stiffness, and swelling     If told, put ice on the affected area. Put ice in a plastic bag. Place a towel between your skin and the bag. Leave the ice on for 20 minutes, 2-3 times a day. If told, apply heat to the affected area as  often as told by your health care provider. Use the heat source that your health care provider recommends, such as a moist heat pack or a heating pad. Place a towel between your skin and the heat source. Leave the heat on for 20-30 minutes. If your skin turns bright red, remove the ice or heat right away to prevent skin damage. The risk of damage is higher if you cannot feel pain, heat, or cold. Managing constipation Your medicines may cause constipation. To prevent or treat constipation, you  may need to: Drink enough fluid to keep your urine pale yellow. Take over-the-counter or prescription medicines. Eat foods that are high in fiber, such as beans, whole grains, and fresh fruits and vegetables. Limit foods that are high in fat and processed sugars, such as fried or sweet foods. Activity Rest as told by your health care provider. Return to your normal activities as told by your health care provider. Ask your health care provider what activities are safe for you. Ask your health care provider if doing physical therapy exercises to improve movement and strength can help you manage your pain. General instructions Check your pain level as told by your health care provider. Ask your health care provider if distraction, relaxation, or aromatherapy can help you manage your pain. Keep all follow-up visits. Your health care provider will monitor your pain level. Contact a health care provider if: Your pain is not controlled by medicine. Your pain does not improve or gets worse. You have side effects from pain medicines. Get help right away if: You have severe pain. You have trouble breathing. You faint, or another person sees you faint. You have chest pain or pressure that lasts for more than a few minutes, or if you have other symptoms along with chest pain, including: Pain or discomfort in one or both arms, your back, neck, jaw, or stomach. Shortness of breath. A cold sweat. Nausea. Feeling light-headed. These symptoms may be an emergency. Get help right away. Call 911. Do not wait to see if the symptoms will go away. Do not drive yourself to the hospital. This information is not intended to replace advice given to you by your health care provider. Make sure you discuss any questions you have with your health care provider. Document Revised: 08/12/2021 Document Reviewed: 08/12/2021 Elsevier Patient Education  2024 Elsevier Inc.   If you have been instructed to have an  in-person evaluation today at a local Urgent Care facility, please use the link below. It will take you to a list of all of our available [facility], including address, phone number and hours of operation. Please do not delay care.  [facility]  If you or a family member do not have a primary care provider, use the link below to schedule a visit and establish care. When you choose a [facility] primary care physician or advanced practice provider, you gain a long-term partner in health. Find a Primary Care Provider  Learn more about [facility] in-office and virtual care options: [facility] - Get Care Now

## 2023-10-29 NOTE — Progress Notes (Signed)
 Virtual Visit Consent   Amber Stephens, you are scheduled for a virtual visit with a [facility] provider today. Just as with appointments in the office, your consent must be obtained to participate. Your consent will be active for this visit and any virtual visit you may have with one of our providers in the next 365 days. If you have a MyChart account, a copy of this consent can be sent to you electronically.  As this is a virtual visit, video technology does not allow for your provider to perform a traditional examination. This may limit your provider's ability to fully assess your condition. If your provider identifies any concerns that need to be evaluated in person or the need to arrange testing (such as labs, EKG, etc.), we will make arrangements to do so. Although advances in technology are sophisticated, we cannot ensure that it will always work on either your end or our end. If the connection with a video visit is poor, the visit may have to be switched to a telephone visit. With either a video or telephone visit, we are not always able to ensure that we have a secure connection.  By engaging in this virtual visit, you consent to the provision of healthcare and authorize for your insurance to be billed (if applicable) for the services provided during this visit. Depending on your insurance coverage, you may receive a charge related to this service.  I need to obtain your verbal consent now. Are you willing to proceed with your visit today? [name] has provided verbal consent on 10/29/2023 for a virtual visit (video or telephone). Roosvelt Mater, [place]  Date: 10/29/2023 4:38 PM   Virtual Visit via Video Note   I, Roosvelt Mater, connected with  EMILY MASSAR  ([id number], [birth year]) on 10/29/23 at  4:30 PM EDT by a video-enabled telemedicine application and verified that I am speaking with the correct person using two identifiers.  Location: Patient: Virtual Visit Location  Patient: Home Provider: Virtual Visit Location Provider: Home Office   I discussed the limitations of evaluation and management by telemedicine and the availability of in person appointments. The patient expressed understanding and agreed to proceed.    History of Present Illness: Amber Stephens is a 48 y.o. who identifies as a female who was assigned female at birth, and is being seen today for c/o waking up with arm pain from her forearm to her fingertips.  Pt states pain has increased and taking Tylenol  and icing arm but no relief. Pt states she had radiation therapy on left arm for breast cancer on Tuesday.  Pt states she has numbness in her arm that is constant.  Pt states she is 6 out of 10 on the pain scale.  Pt denies redness to area but reports slight swelling.  Pt states it is tender to touch in certain spots but is able to wiggle her fingers. Pt states has only take Tylenol  325mg  tab and only one tab at around 12 noon. Pt states this was her last radiation treatment and had never experienced this before.   HPI: HPI  Problems:  Patient Active Problem List   Diagnosis Date Noted   Genetic testing 06/24/2023   Malignant neoplasm of upper-outer quadrant of left breast in female, estrogen receptor positive (HCC) 06/13/2023   Generalized obesity 03/03/2022   Abnormal craving 10/05/2021   Other hyperlipidemia 08/05/2021   Prediabetes 03/30/2021   Vitamin D  insufficiency 03/30/2021   Obesity (BMI 30.0-34.9) 12/03/2020  Hypertension 05/28/2020   S/P TAH (total abdominal hysterectomy) 06/16/2017   Physical exam, annual 10/30/2012   Allergic rhinitis 05/15/2007   Asthma 05/15/2007    Allergies:  Allergies  Allergen Reactions   Grass Pollen(K-O-R-T-Swt Vern) Shortness Of Breath   Phenol Shortness Of Breath   Other     Almonds   Wellbutrin  [Bupropion ] Other (See Comments)    tremors   Medications:  Current Outpatient Medications:    albuterol  (PROAIR  HFA) 108 (90 Base)  MCG/ACT inhaler, USE 1-2 PUFFS BY MOUTH EVERY 4 HOURS AS NEEDED wheezing and shortness of breath, Disp: 3 each, Rfl: 0   amLODipine  (NORVASC ) 5 MG tablet, TAKE 1 TABLET BY MOUTH EVERY DAY, Disp: 90 tablet, Rfl: 1   Phentermine  HCl (LOMAIRA ) 8 MG TABS, Take 1 tablet (8 mg total) by mouth 2 (two) times daily., Disp: 60 tablet, Rfl: 0  Observations/Objective: Patient is well-developed, well-nourished in no acute distress.  Resting comfortably at home.  Head is normocephalic, atraumatic.  No labored breathing.  Speech is clear and coherent with logical content.  Patient is alert and oriented at baseline.    Assessment and Plan: 1. Left arm pain (Primary)  -Discussed with patient starting an NSAID like Ibuprofen  or Naproxen and patient stated she would send her husband to get her some instead of a prescription  -Pt stated she messaged her oncologist as well but has not received a callback yet  -Pt was advised she could also take two Tylenol  325mg  tab and see if that was helpful  -Pt advised that if worsening symptoms to proceed to in person urgent care or emergency room.   Follow Up Instructions: I discussed the assessment and treatment plan with the patient. The patient was provided an opportunity to ask questions and all were answered. The patient agreed with the plan and demonstrated an understanding of the instructions.  A copy of instructions were sent to the patient via MyChart unless otherwise noted below.    The patient was advised to call back or seek an in-person evaluation if the symptoms worsen or if the condition fails to improve as anticipated.    Roosvelt Mater, PA-C

## 2023-11-01 ENCOUNTER — Other Ambulatory Visit: Payer: Self-pay

## 2023-11-01 ENCOUNTER — Telehealth: Payer: Self-pay

## 2023-11-01 NOTE — Telephone Encounter (Signed)
 Patient was having symptoms of arm pain and swelling that began on Saturday morning, she did take over the counter tylenol  with no relief. Ibuprofen  helped with her symptoms. Called patient today and she is doing well, the swelling and pain has gone down.  Let patient know to call our office with any worsening symptoms. Reminded her in one month that she will be getting a tele phone call to check in with her to see how she is doing.   Advised patient to move up her appointment with physical therapy

## 2023-11-01 NOTE — Therapy (Signed)
 OUTPATIENT PHYSICAL THERAPY  UPPER EXTREMITY ONCOLOGY EVALUATION  Patient Name: Amber Stephens MRN: [id number] DOB:Nov 28, 1975, 48 y.o., female Today's Date: 11/01/2023  END OF SESSION:   Past Medical History:  Diagnosis Date   Allergic rhinitis    Allergy    As a child   Anemia    borderline, prior to hysterectomy   Asthma    Breast cancer (HCC) 2025   Cancer (HCC)    Breast   Fatigue    History of pneumonia age 63 and agin   my mom said at age 82 and again at age 75    Hypertension 05/28/2020   Multiple food allergies    Shortness of breath    Shortness of breath on exertion    Past Surgical History:  Procedure Laterality Date   ABDOMINAL HYSTERECTOMY N/A 06/16/2017   Procedure: HYSTERECTOMY ABDOMINAL TOTAL;  Surgeon: Rosalynn LELON Ingle, MD;  Location[facility];  Service: Gynecology;  Laterality: N/A;   BILATERAL SALPINGECTOMY Bilateral 06/16/2017   Procedure: BILATERAL SALPINGECTOMY;  Surgeon: Rosalynn LELON Ingle, MD;  Location: [facility];  Service: Gynecology;  Laterality: Bilateral;   BREAST BIOPSY Left 06/06/2023   US  LT BREAST BX W LOC DEV 1ST LESION IMG BX SPEC US  GUIDE 06/06/2023 [facility] MAMMOGRAPHY   BREAST BIOPSY  07/08/2023   MM LT RADIOACTIVE SEED LOC MAMMO GUIDE 07/08/2023 [facility] MAMMOGRAPHY   BREAST LUMPECTOMY Left 07/20/2023   Procedure: BREAST LUMPECTOMY;  Surgeon: Aron Shoulders, MD;  Location: [facility];  Service: General;  Laterality: Left;  RE-EXCISION LUMPECTOMY LEFT BREAST   BREAST LUMPECTOMY Left 08/16/2023   Procedure: REEXCISION LEFT BREAST LUMPECTOMY;  Surgeon: Aron Shoulders, MD;  Location: [facility];  Service: General;  Laterality: Left;  RE-EXCISION LEFT LUMPECTOMY   BREAST LUMPECTOMY WITH RADIOACTIVE SEED AND SENTINEL LYMPH NODE BIOPSY Left 07/12/2023   Procedure: RADIOACTIVE SEED GUIDED LEFT BREAST LUMPECTOMY WITH LEFT AXILLARY SENTINEL LYMPH NODE BIOPSY;  Surgeon: Aron Shoulders, MD;   Location: [facility];  Service: General;  Laterality: Left;   BTL  11/2008   Dr Marget; bilateral tubal ligation    CESAREAN SECTION     x4 , with each child , has 4 children    DILATION AND CURETTAGE OF UTERUS  07/02/2012   MYOMECTOMY  2001   Dr Ingle [facility]    TUBAL LIGATION  2010   Patient Active Problem List   Diagnosis Date Noted   Genetic testing 06/24/2023   Malignant neoplasm of upper-outer quadrant of left breast in female, estrogen receptor positive (HCC) 06/13/2023   Generalized obesity 03/03/2022   Abnormal craving 10/05/2021   Other hyperlipidemia 08/05/2021   Prediabetes 03/30/2021   Vitamin D  insufficiency 03/30/2021   Obesity (BMI 30.0-34.9) 12/03/2020   Hypertension 05/28/2020   S/P TAH (total abdominal hysterectomy) 06/16/2017   Physical exam, annual 10/30/2012   Allergic rhinitis 05/15/2007   Asthma 05/15/2007    PCP:   REFERRING PROVIDER: Lauraine Golden, MD  REFERRING DIAG: Left UE pain, swelling  THERAPY DIAG:  No diagnosis found.  ONSET DATE: 07/12/2023  Rationale for Evaluation and Treatment: Rehabilitation  SUBJECTIVE:  SUBJECTIVE STATEMENT: ***  PERTINENT HISTORY:  Patient was diagnosed on 05/18/2023 with left grade 2 invasive ductal carcinoma breast cancer. It measured 1.5 cm and was located in the upper outer quadrant. It was ER/PR positive and HER2 negative with a Ki67 of 1%. LEFT Lumpectomy 07/12/23 with 2 negative nodes removed.  She had a seroma in the breast and positive margins.Re-excision performed on 07/20/23 and 08/16/2023. Radiation ended 10/25/2023  PAIN:   Are you having pain? {yes/no:20286} NPRS scale: ***/10 Pain location: *** Pain orientation: {Pain Orientation:25161}  PAIN TYPE: {type:313116} Pain description: {PAIN  DESCRIPTION:21022940}  Aggravating factors: *** Relieving factors: ***  PRECAUTIONS: Left UE lymphedema risk  RED FLAGS: None   WEIGHT BEARING RESTRICTIONS: No  FALLS:  Has patient fallen in last 6 months? 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  COGNITION: Overall cognitive status: Within functional limits for tasks assessed   PALPATION: ***  OBSERVATIONS / OTHER ASSESSMENTS: ***  SENSATION: Light touch: {intact/deficits:24005}  POSTURE: WNL  UPPER EXTREMITY AROM/PROM:  A/PROM RIGHT   eval   Shoulder extension   Shoulder flexion   Shoulder abduction   Shoulder internal rotation   Shoulder external rotation     (Blank rows = not tested)  A/PROM LEFT   eval  Shoulder extension   Shoulder flexion   Shoulder abduction   Shoulder internal rotation   Shoulder external rotation     (Blank rows = not tested)  CERVICAL AROM: All within normal limits:      UPPER EXTREMITY STRENGTH:   LYMPHEDEMA ASSESSMENTS:   SURGERY TYPE/DATE: Left Lumpectomy 07/12/2023 Left Re-excision 07/20/2023,08/16/2023 Re-excision  NUMBER OF LYMPH NODES REMOVED: 0/2  CHEMOTHERAPY: NO  RADIATION:YES, ended 10/25/2023  HORMONE TREATMENT: ***  INFECTIONS: ***   LYMPHEDEMA ASSESSMENTS:   LANDMARK RIGHT  eval  At axilla    15 cm proximal to olecranon process   10 cm proximal to olecranon process   Olecranon process   15 cm proximal to ulnar styloid process   10 cm proximal to ulnar styloid process   Just proximal to ulnar styloid process   Across hand at thumb web space   At base of 2nd digit   (Blank rows = not tested)  LANDMARK LEFT  eval  At  axilla    15 cm proximal to olecranon process   10 cm proximal to olecranon process   Olecranon process   15 cm proximal to ulnar styloid process   10 cm proximal to ulnar styloid process   Just proximal to ulnar styloid process   Across hand at thumb web space   At base of 2nd digit   (Blank rows = not tested)   FUNCTIONAL TESTS:  {Functional tests:24029}  GAIT: Distance walked: *** Assistive device utilized: {Assistive devices:23999} Level of assistance: {Levels of assistance:24026} Comments: ***  QUICK DASH SURVEY: ***  TREATMENT DATE: ***    PATIENT EDUCATION:  Education details: *** Person educated: {Person educated:25204} Education method: {Education Method:25205} Education comprehension: {Education Comprehension:25206}  HOME EXERCISE PROGRAM: ***  ASSESSMENT:  CLINICAL IMPRESSION: Patient is a 48 y.o. female who was seen today for physical therapy evaluation and treatment for concerns of left forearm pain, tenderness and swelling.    OBJECTIVE IMPAIRMENTS: {opptimpairments:25111}.   ACTIVITY LIMITATIONS: {activitylimitations:27494}  PARTICIPATION LIMITATIONS: {participationrestrictions:25113}  PERSONAL FACTORS: {Personal factors:25162} are also affecting patient's functional outcome.   REHAB POTENTIAL: Good  CLINICAL DECISION MAKING: {clinical decision making:25114}  EVALUATION COMPLEXITY: {Evaluation complexity:25115}  GOALS: Goals reviewed with patient? Yes  SHORT TERM GOALS: Target date: ***  *** Baseline: Goal status: INITIAL  2.  *** Baseline:  Goal status: INITIAL  3.  *** Baseline:  Goal status: INITIAL  4.  *** Baseline:  Goal status: INITIAL  5.  *** Baseline:  Goal status: INITIAL  6.  *** Baseline:  Goal status: INITIAL  LONG TERM GOALS: Target date: ***  *** Baseline:  Goal status:  INITIAL  2.  *** Baseline:  Goal status: INITIAL  3.  *** Baseline:  Goal status: INITIAL  4.  *** Baseline:  Goal status: INITIAL  5.  *** Baseline:  Goal status: INITIAL  6.  *** Baseline:  Goal status: INITIAL  PLAN:  PT FREQUENCY: {rehab frequency:25116}  PT DURATION: {rehab duration:25117}  PLANNED INTERVENTIONS: {rehab planned interventions:25118::Patient/Family education,Balance training,Joint mobilization,Therapeutic exercises,Therapeutic activity,Neuromuscular re-education,Gait training,Self Care}  PLAN FOR NEXT SESSION: ***  Grayce JINNY Sheldon, PT 11/01/2023, 5:43 PM

## 2023-11-02 ENCOUNTER — Other Ambulatory Visit: Payer: Self-pay

## 2023-11-02 ENCOUNTER — Ambulatory Visit: Attending: Radiation Oncology

## 2023-11-02 DIAGNOSIS — C50412 Malignant neoplasm of upper-outer quadrant of left female breast: Secondary | ICD-10-CM | POA: Insufficient documentation

## 2023-11-02 DIAGNOSIS — Z170 Estrogen receptor positive status [ER+]: Secondary | ICD-10-CM | POA: Insufficient documentation

## 2023-11-02 DIAGNOSIS — Z483 Aftercare following surgery for neoplasm: Secondary | ICD-10-CM | POA: Diagnosis present

## 2023-11-02 DIAGNOSIS — M79602 Pain in left arm: Secondary | ICD-10-CM | POA: Insufficient documentation

## 2023-11-06 ENCOUNTER — Other Ambulatory Visit: Payer: Self-pay | Admitting: Family

## 2023-11-09 ENCOUNTER — Encounter: Payer: Self-pay | Admitting: Hematology and Oncology

## 2023-11-09 ENCOUNTER — Other Ambulatory Visit: Payer: Self-pay | Admitting: *Deleted

## 2023-11-09 ENCOUNTER — Inpatient Hospital Stay: Attending: Licensed Clinical Social Worker | Admitting: Hematology and Oncology

## 2023-11-09 DIAGNOSIS — C50412 Malignant neoplasm of upper-outer quadrant of left female breast: Secondary | ICD-10-CM | POA: Diagnosis not present

## 2023-11-09 DIAGNOSIS — Z170 Estrogen receptor positive status [ER+]: Secondary | ICD-10-CM | POA: Diagnosis not present

## 2023-11-09 MED ORDER — TAMOXIFEN CITRATE 20 MG PO TABS: 20.0000 mg | ORAL_TABLET | Freq: Every day | ORAL | 3 refills | Status: DC

## 2023-11-09 MED ORDER — ANASTROZOLE 1 MG PO TABS: 1.0000 mg | ORAL_TABLET | Freq: Every day | ORAL | 1 refills | Status: AC

## 2023-11-09 NOTE — Assessment & Plan Note (Signed)
 06/06/2023:Screening mammogram detected left breast asymmetry and distortion at 12:00 spiculated mass by ultrasound measuring 1.5 cm, biopsy grade 2 IDC ER 100% PR 100% Ki67 1%, HER2 negative    Recommendations: 1. Breast conserving surgery: 07/12/23: Grade 2 IDC with DCIS 2.2 cm 0/1 LN Neg,  Margin Re-excision: Positive for DCIS (margin still positive) Margin reexcision planned for July 15 2. Oncotype DX testing score 9 (6% risk of distant recurrence): No role of chemotherapy 3. Adjuvant radiation therapy 09/28/2023-10/25/2023 4. Adjuvant antiestrogen therapy: Tamoxifen to start 11/02/2023 5.  Genetic testing: 06/27/2023: Negative ------------------------------------- Tamoxifen toxicities:  Breast cancer surveillance: Breast exam 11/09/2023: Benign Mammogram will be arranged  Return to clinic in 1 year for follow-up

## 2023-11-09 NOTE — Progress Notes (Signed)
 HEMATOLOGY-ONCOLOGY TELEPHONE VISIT PROGRESS NOTE  I connected with our patient on 11/09/23 at 10:00 AM EDT by telephone and verified that I am speaking with the correct person using two identifiers.  I discussed the limitations, risks, security and privacy concerns of performing an evaluation and management service by telephone and the availability of in person appointments.  I also discussed with the patient that there may be a patient responsible charge related to this service. The patient expressed understanding and agreed to proceed.   History of Present Illness: Telephone follow-up to discuss antiestrogen therapy  History of Present Illness Amber Stephens is a 48 year old female with breast cancer who presents for follow-up after completing radiation and surgery.  She has completed radiation therapy and surgery for breast cancer. She is considering starting tamoxifen to reduce the risk of recurrence. Her current medications include Lomaira  and albuterol , with occasional use of Benadryl .  She is confirmed to be in menopause, with an estradiol  level below ten and an FSH level of sixty-eight. This menopausal status is relevant to her treatment options, including the consideration of anastrozole, which may have fewer side effects such as hot flashes and no risk of blood clots, though it may affect bone density over time.    Oncology History  Malignant neoplasm of upper-outer quadrant of left breast in female, estrogen receptor positive (HCC)  06/06/2023 Initial Diagnosis   Screening mammogram detected left breast asymmetry and distortion at 12:00 spiculated mass by ultrasound measuring 1.5 cm, biopsy grade 2 IDC ER 100% PR 100% Ki67 1%, HER2 negative   06/15/2023 Cancer Staging   Staging form: Breast, AJCC 8th Edition - Clinical: Stage IA (cT1c, cN0, cM0, G2, ER+, PR+, HER2-) - Signed by Odean Potts, MD on 06/15/2023 Stage prefix: Initial diagnosis Histologic grading system: 3 grade  system    Genetic Testing   [facility] CancerNext-Expanded Panel+RNA was Negative. Report date is 06/27/2023.   The CancerNext-Expanded gene panel offered by [facility] and includes sequencing, rearrangement, and RNA analysis for the following 77 genes: AIP, ALK, APC, ATM, AXIN2, BAP1, BARD1, BMPR1A, BRCA1, BRCA2, BRIP1, CDC73, CDH1, CDK4, CDKN1B, CDKN2A, CEBPA, CHEK2, CTNNA1, DDX41, DICER1, ETV6, FH, FLCN, GATA2, LZTR1, MAX, MBD4, MEN1, MET, MLH1, MSH2, MSH3, MSH6, MUTYH, NF1, NF2, NTHL1, PALB2, PHOX2B, PMS2, POT1, PRKAR1A, PTCH1, PTEN, RAD51C, RAD51D, RB1, RET, RPS20, RUNX1, SDHA, SDHAF2, SDHB, SDHC, SDHD, SMAD4, SMARCA4, SMARCB1, SMARCE1, STK11, SUFU, TMEM127, TP53, TSC1, TSC2, VHL, and WT1 (sequencing and deletion/duplication); EGFR, HOXB13, KIT, MITF, PDGFRA, POLD1, and POLE (sequencing only); EPCAM and GREM1 (deletion/duplication only).       REVIEW OF SYSTEMS:   Constitutional: Denies fevers, chills or abnormal weight loss All other systems were reviewed with the patient and are negative. Observations/Objective:     Assessment Plan:  Malignant neoplasm of upper-outer quadrant of left breast in female, estrogen receptor positive (HCC) 06/06/2023:Screening mammogram detected left breast asymmetry and distortion at 12:00 spiculated mass by ultrasound measuring 1.5 cm, biopsy grade 2 IDC ER 100% PR 100% Ki67 1%, HER2 negative    Recommendations: 1. Breast conserving surgery: 07/12/23: Grade 2 IDC with DCIS 2.2 cm 0/1 LN Neg,  Margin Re-excision: Positive for DCIS (margin still positive) Margin reexcision planned for July 15 2. Oncotype DX testing score 9 (6% risk of distant recurrence): No role of chemotherapy 3. Adjuvant radiation therapy 09/28/2023-10/25/2023 4. Adjuvant antiestrogen therapy: Anastrozole starts 11/09/2023 (patients labs were in menopause) 5.  Genetic testing: 06/27/2023: Negative ------------------------------------- Anastrozole counseling: We discussed the risks and benefits  of anti-estrogen therapy with aromatase inhibitors. These include but not limited to insomnia, hot flashes, mood changes, vaginal dryness, bone density loss, and weight gain. We strongly believe that the benefits far outweigh the risks. Patient understands these risks and consented to starting treatment. Planned treatment duration is 5 years.  Breast cancer surveillance: Breast exam 11/09/2023: Benign Mammogram will be arranged by Morna  Return to clinic in 3 months for SCP visit.    I discussed the assessment and treatment plan with the patient. The patient was provided an opportunity to ask questions and all were answered. The patient agreed with the plan and demonstrated an understanding of the instructions. The patient was advised to call back or seek an in-person evaluation if the symptoms worsen or if the condition fails to improve as anticipated.   I provided 20 minutes of non-face-to-face time during this encounter.  This includes time for charting and coordination of care   Naomi MARLA Chad, MD

## 2023-11-17 NOTE — Progress Notes (Addendum)
  Amber Stephens presents today for follow-up after completing radiation to her left breast on *date*  Patient is doing well and is getting her energy back. Pleasant as always. Pain:Patient denies any breast pain. Skin: Skin color is coming back. Uses vitamin E oil twice daily, says it has helped with the skin discoloration. ROM: Has full range of motion  Lymphedema: Denies any lymphedema MedOnc F/U: 01/28/2024 with Cornetto, NP Other issues of note:  None  Pt reports Yes No Comments  Tamoxifen []  [x]    Letrozole []  [x]    Anastrazole [x]  []   Tolerating medication well.  Mammogram [x]  Date: 06/02/2023 []      Encouraged to call our office with any questions or concerns.

## 2023-11-23 ENCOUNTER — Encounter: Admitting: Family

## 2023-11-24 ENCOUNTER — Ambulatory Visit
Admission: RE | Admit: 2023-11-24 | Discharge: 2023-11-24 | Disposition: A | Discharge status: Home / Self Care | Source: Ambulatory Visit | Attending: Radiation Oncology | Admitting: Radiation Oncology

## 2023-11-24 DIAGNOSIS — C50412 Malignant neoplasm of upper-outer quadrant of left female breast: Secondary | ICD-10-CM

## 2023-11-30 ENCOUNTER — Ambulatory Visit (INDEPENDENT_AMBULATORY_CARE_PROVIDER_SITE_OTHER): Admitting: Nurse Practitioner

## 2023-11-30 ENCOUNTER — Encounter: Payer: Self-pay | Admitting: Nurse Practitioner

## 2023-11-30 VITALS — BP 136/86 | HR 75 | Temp 98.3°F | Ht 62.0 in | Wt 190.0 lb

## 2023-11-30 DIAGNOSIS — Z6834 Body mass index (BMI) 34.0-34.9, adult: Secondary | ICD-10-CM | POA: Diagnosis not present

## 2023-11-30 DIAGNOSIS — E66811 Obesity, class 1: Secondary | ICD-10-CM

## 2023-11-30 DIAGNOSIS — R632 Polyphagia: Secondary | ICD-10-CM

## 2023-11-30 MED ORDER — PHENTERMINE-TOPIRAMATE ER 7.5-46 MG PO CP24: ORAL_CAPSULE | ORAL | 0 refills | Status: DC

## 2023-11-30 NOTE — Progress Notes (Signed)
 Office: [phone]  /  Fax: [phone]  WEIGHT SUMMARY AND BIOMETRICS  Weight Lost Since Last Visit: 0lb  Weight Gained Since Last Visit: 6lb   Vitals Temp: 98.3 F (36.8 C) BP: 136/86 Pulse Rate: 75 SpO2: 99 %   Anthropometric Measurements Height: 5' 2 (1.575 m) Weight: 190 lb (86.2 kg) BMI (Calculated): 34.74 Weight at Last Visit: 184lb Weight Lost Since Last Visit: 0lb Weight Gained Since Last Visit: 6lb Starting Weight: 205lb Total Weight Loss (lbs): 15 lb (6.804 kg)   Body Composition  Body Fat %: 37.3 % Fat Mass (lbs): 71.2 lbs Muscle Mass (lbs): 113.6 lbs Total Body Water (lbs): 76 lbs Visceral Fat Rating : 10   Other Clinical Data Fasting: Yes Labs: No Today's Visit #: 44 Starting Date: 03/26/21     HPI  Chief Complaint: OBESITY  Amber Stephens is here to discuss her progress with her obesity treatment plan. She is on the the Category 2 Plan and states she is following her eating plan approximately 10-15 % of the time. She states she is exercising 0 minutes 0 days per week.   Interval History:  Since last office visit she has gained 6 pounds.  She started anastrozole 1 mg on 11/09/23. She has been struggling with fatigue and is unsure if due to her current treatment and/or stress at work/finishing doctorate July 2026-graduates Dec 2026.  She started a new position at work and starting a new semester. She is drinking water and a protein shake daily.  She is snacking on yogurt and an apple and grapes. She struggles with snacking once she leaves work due to stress.  She hasn't been able to exercise but is planning to start back.    Pharmacotherapy for weight loss: She is currently taking Lomaira  8mg  once daily for medical weight loss.  Denies side effects.     Previous pharmacotherapy for medical weight loss:  Lomaira  & Wellbutrin  (stopped due to tremors)    Bariatric surgery:  Patient has not had bariatric surgery            PHYSICAL  EXAM:  Blood pressure 136/86, pulse 75, temperature 98.3 F (36.8 C), height 5' 2 (1.575 m), weight 190 lb (86.2 kg), last menstrual period 06/04/2017, SpO2 99%. Body mass index is 34.75 kg/m.  General: She is overweight, cooperative, alert, well developed, and in no acute distress. PSYCH: Has normal mood, affect and thought process.   Extremities: No edema.  Neurologic: No gross sensory or motor deficits. No tremors or fasciculations noted.    DIAGNOSTIC DATA REVIEWED:  BMET    Component Value Date/Time   NA 140 06/15/2023 1220   NA 139 05/04/2023 1218   K 4.2 06/15/2023 1220   CL 106 06/15/2023 1220   CO2 29 06/15/2023 1220   GLUCOSE 96 06/15/2023 1220   BUN 20 06/15/2023 1220   BUN 13 05/04/2023 1218   CREATININE 0.94 06/15/2023 1220   CALCIUM 9.6 06/15/2023 1220   GFRNONAA >60 06/15/2023 1220   GFRAA >60 06/07/2017 0915   Lab Results  Component Value Date   HGBA1C 5.8 (H) 03/16/2023   HGBA1C 5.7 (H) 03/26/2021   Lab Results  Component Value Date   INSULIN  8.3 03/16/2023   INSULIN  19.1 03/26/2021   Lab Results  Component Value Date   TSH 0.741 03/16/2023   CBC    Component Value Date/Time   WBC 6.2 06/15/2023 1220   WBC 5.1 04/03/2019 1005   RBC 4.70 06/15/2023 1220   HGB  13.7 06/15/2023 1220   HGB 13.9 03/16/2023 1050   HCT 40.8 06/15/2023 1220   HCT 41.7 03/16/2023 1050   PLT 357 06/15/2023 1220   PLT 366 03/16/2023 1050   MCV 86.8 06/15/2023 1220   MCV 90 03/16/2023 1050   MCH 29.1 06/15/2023 1220   MCHC 33.6 06/15/2023 1220   RDW 13.3 06/15/2023 1220   RDW 12.4 03/16/2023 1050   Iron Studies    Component Value Date/Time   IRON 106 10/30/2012 1137   IRONPCTSAT 28.6 10/30/2012 1137   Lipid Panel     Component Value Date/Time   CHOL 160 03/16/2023 1050   TRIG 69 03/16/2023 1050   HDL 53 03/16/2023 1050   CHOLHDL 3 05/28/2020 0831   VLDL 11.4 05/28/2020 0831   LDLCALC 94 03/16/2023 1050   Hepatic Function Panel     Component Value  Date/Time   PROT 7.7 06/15/2023 1220   PROT 7.2 03/16/2023 1050   ALBUMIN 4.5 06/15/2023 1220   ALBUMIN 4.4 03/16/2023 1050   AST 18 06/15/2023 1220   ALT 18 06/15/2023 1220   ALKPHOS 48 06/15/2023 1220   BILITOT 0.4 06/15/2023 1220   BILIDIR 0.1 04/03/2019 1005      Component Value Date/Time   TSH 0.741 03/16/2023 1050   Nutritional Lab Results  Component Value Date   VD25OH 39.0 03/16/2023   VD25OH 83.1 03/03/2022   VD25OH 72.4 08/05/2021     ASSESSMENT AND PLAN  TREATMENT PLAN FOR OBESITY:  Recommended Dietary Goals  Amber Stephens is currently in the action stage of change. As such, her goal is to continue weight management plan. She has agreed to the Category 2 Plan.  Behavioral Intervention  We discussed the following Behavioral Modification Strategies today: increasing lean protein intake to established goals, decreasing simple carbohydrates , increasing vegetables, increasing fiber rich foods, increasing water intake , work on meal planning and preparation, reading food labels , keeping healthy foods at home, continue to work on maintaining a reduced calorie state, getting the recommended amount of protein, incorporating whole foods, making healthy choices, staying well hydrated and practicing mindfulness when eating., and increase protein intake, fibrous foods (25 grams per day for women, 30 grams for men) and water to improve satiety and decrease hunger signals. .  Additional resources provided today: NA  Recommended Physical Activity Goals  Amber Stephens has been advised to work up to 150 minutes of moderate intensity aerobic activity a week and strengthening exercises 2-3 times per week for cardiovascular health, weight loss maintenance and preservation of muscle mass.   She has agreed to Think about enjoyable ways to increase daily physical activity and overcoming barriers to exercise, Increase physical activity in their day and reduce sedentary time (increase NEAT)., Work  on scheduling and tracking physical activity. , Increase volume of physical activity to a goal of 240 minutes a week, and Combine aerobic and strengthening exercises for efficiency and improved cardiometabolic health.   Pharmacotherapy We discussed various medication options to help Amber Stephens with her weight loss efforts and we both agreed to stop Lomaira  and start Qsymia dose 2.  Side effects discussed  Avoid Contrave due to taking anastrozole   ASSOCIATED CONDITIONS ADDRESSED TODAY  Action/Plan  Polyphagia -     Phentermine -Topiramate ER; Take one po daily  Dispense: 30 capsule; Refill: 0  Obesity (BMI 30.0-34.9) -     Phentermine -Topiramate ER; Take one po daily  Dispense: 30 capsule; Refill: 0       Will recheck labs at  next visit.  Return in about 4 weeks (around 12/28/2023).Amber Stephens She was informed of the importance of frequent follow up visits to maximize her success with intensive lifestyle modifications for her multiple health conditions.   ATTESTASTION STATEMENTS:  Reviewed by clinician on day of visit: allergies, medications, problem list, medical history, surgical history, family history, social history, and previous encounter notes.   Amber Stephens SAUNDERS. Yony Roulston FNP-C

## 2023-11-30 NOTE — Patient Instructions (Signed)
 What is Qsymia and how does it work?  Qsymia is a prescription only medicine to help with your weight loss. It is a combination of two medicines that are low dose, long-acting: Phentermine & Topiramate. Qsymia contains low dose Phentermine which is a stimulant medicine that could affect your heart rate and blood pressure Qsymia is designed to help you feel satisfied faster that will help you to decrease portion size. Also, it helps curb late night snacking habits. Some food you usually enjoy may start to taste differently which will help you make healthier food choices.  This medicine will be most effective when combined with a reduced calorie diet and physical activity.  How should I take Qsymia? Take daily in the morning with breakfast. Swallow the extended-release capsule whole. Do not crush, break, or chew it.  If you miss a dose, take it as soon as possible. If it is after 12pm, skip the missed dose and go back to your regular dosing schedule. Do not take extra medicine to make up for the missed dose. You have received two separate prescriptions today. You will initially take a lower dose of 3.75mg /23mg  for 14 days then increase to a higher dose of 7.5mg /46mg  for maintenance for 30 days. There are 4 total dosing options. Your provider will discuss any need to go up to a higher dose during your office visits.  If you are taking Levothyroxine, take the Levothyroxine 1 hours before breakfast and the take the Qsymia 1 hour after breakfast. Do not stop taking Qsymia without talking to your provider. Stopping Qsymia suddenly can cause serious side effects, such as seizures and headaches.   What should I avoid while taking Qsymia? Limit caffeine to 1 small cup daily. Examples are soda, coffee, tea, herbal tea, energy drinks, and chocolate Avoid decongestant medicines like Sudafed, Mucinex-D, and Zyrtec-D. Qsymia may cause you to feel dizzy, drowsy, or confused, or to have trouble thinking  or speaking. Do not drive or do anything else that could be dangerous until you know how this medicine affects you.  Women who can become pregnant: Use effective birth control (contraception) consistently while taking Qsymia. If you miss a menstrual period, STOP Qsymia and call our office immediately. Pregnancy tests will be performed at your appointment if indicated.  What side effects may I notice when taking Qsymia? Side effects that usually do not require medical attention (report to our office if they continue or are bothersome): Dry mouth (drink at least 64 oz of fluid daily) Constipation (you may take over the counter laxative if needed) Metallic taste in your mouth when drinking carbonated beverages Numbness or tingling in the hands, arms, feet or face that lasts more than a week Headache Sudden changes in vision Mental fuzziness (problems with concentration, attention, memory or speech) Trouble sleeping (insomnia) Side effects that you should report to our office as soon as possible: Increases in heart rate and/or palpitations (feeling like your heart is racing or pounding in your chest that lasts several minutes) Chest pain Increased blood pressure Dizziness or feeling faint Shortness of breath Irritability Feeling anxious, agitated, restless, or nervous Depression or severe changes in mood Problems urinating Unusual swelling of the legs Vomiting   Other important information You will be asked to sign an informed consent prior to starting Qysmia Qsymia is a federally controlled substance. Keep Qsymia in a safe place to prevent misuse and abuse. Selling or giving away Qsymia may harm others, and is against the law.  Your prescription  will be sent to the pharmacy during your visit.  Refills will require an office visit. Your insurance may not cover the cost of this medicine. Our office will complete a pre-authorization if required by your insurance.

## 2023-12-01 ENCOUNTER — Encounter: Payer: Self-pay | Admitting: Nurse Practitioner

## 2023-12-01 ENCOUNTER — Other Ambulatory Visit: Payer: Self-pay | Admitting: Nurse Practitioner

## 2023-12-01 DIAGNOSIS — R632 Polyphagia: Secondary | ICD-10-CM

## 2023-12-01 DIAGNOSIS — E66811 Obesity, class 1: Secondary | ICD-10-CM

## 2023-12-01 MED ORDER — PHENTERMINE-TOPIRAMATE ER 7.5-46 MG PO CP24: ORAL_CAPSULE | ORAL | 0 refills | Status: DC

## 2023-12-14 ENCOUNTER — Encounter: Payer: Self-pay | Admitting: Family

## 2023-12-14 ENCOUNTER — Ambulatory Visit (INDEPENDENT_AMBULATORY_CARE_PROVIDER_SITE_OTHER): Admitting: Family

## 2023-12-14 VITALS — BP 126/79 | HR 68 | Temp 98.2°F | Resp 16 | Ht 62.0 in | Wt 194.0 lb

## 2023-12-14 DIAGNOSIS — Z0000 Encounter for general adult medical examination without abnormal findings: Secondary | ICD-10-CM

## 2023-12-14 DIAGNOSIS — I10 Essential (primary) hypertension: Secondary | ICD-10-CM | POA: Diagnosis not present

## 2023-12-14 DIAGNOSIS — N951 Menopausal and female climacteric states: Secondary | ICD-10-CM | POA: Insufficient documentation

## 2023-12-14 MED ORDER — GABAPENTIN 300 MG PO CAPS: 300.0000 mg | ORAL_CAPSULE | Freq: Every day | ORAL | 1 refills | Status: AC

## 2023-12-14 NOTE — Progress Notes (Signed)
 Subjective:     Patient ID: Amber Stephens, female    DOB: 1975/09/16, 48 y.o.   MRN: [id number]  Chief Complaint  Patient presents with   Annual Exam    HPI  Discussed the use of AI scribe software for clinical note transcription with the patient, who gave verbal consent to proceed.  History of Present Illness Amber Stephens is a 48 year old female who presents for an annual physical exam.  She has a history of asthma and is hesitant to receive additional vaccinations this year due to various personal challenges.  She has completed radiation therapy for left-sided breast cancer and is currently under the care of Dr. Valere. She experiences hot flashes and night sweats. She takes her medication at night to minimize daytime symptoms.  She is currently taking phentermine , prescribed by Amber T. at [facility], and has been cleared to exercise. She is engaging in regular physical activity and is focused on weight management after gaining weight due to treatments.  She has a family history of frontal lobe dementia, as her mother had the condition, which causes her concern about her own cognitive health.  She has had a hysterectomy and is up to date on her colonoscopy and mammogram. No current smoking, vaping, drug, or alcohol use. No current cough, cold symptoms, skin rashes, moles of concern, hearing or vision issues, swelling in her legs, digestive issues, urinary concerns, unusual muscle or joint pain, frequent headaches, or concerns about depression or anxiety.   Immunizations: declines prevnar Diet: healthy Exercise:  yes Colonoscopy: 2022, up to date Pap Smear: hysterectomy Mammogram: up to date Vision: up to date Dental: up to date      Health Maintenance Due  Topic Date Due   HIV Screening  Never done   Hepatitis C Screening  Never done   Pneumococcal Vaccine (1 of 2 - PCV) Never done   COVID-19 Vaccine (5 - 2025-26 season)  10/03/2023   DTaP/Tdap/Td (2 - Td or Tdap) 12/04/2023    Past Medical History:  Diagnosis Date   Allergic rhinitis    Allergy    As a child   Anemia    borderline, prior to hysterectomy   Asthma    Breast cancer (HCC) 2025   Cancer (HCC)    Breast   Fatigue    History of pneumonia age 20 and agin   my mom said at age 32 and again at age 27    Hypertension 05/28/2020   Multiple food allergies    Shortness of breath    Shortness of breath on exertion     Past Surgical History:  Procedure Laterality Date   ABDOMINAL HYSTERECTOMY N/A 06/16/2017   Procedure: HYSTERECTOMY ABDOMINAL TOTAL;  Surgeon: Amber LELON Ingle, MD;  Location: [facility];  Service: Gynecology;  Laterality: N/A;   BILATERAL SALPINGECTOMY Bilateral 06/16/2017   Procedure: BILATERAL SALPINGECTOMY;  Surgeon: Amber LELON Ingle, MD;  Location: [facility];  Service: Gynecology;  Laterality: Bilateral;   BREAST BIOPSY Left 06/06/2023   US  LT BREAST BX W LOC DEV 1ST LESION IMG BX SPEC US  GUIDE 06/06/2023 [facility]   BREAST BIOPSY  07/08/2023   MM LT RADIOACTIVE SEED LOC MAMMO GUIDE 07/08/2023 [facility]   BREAST LUMPECTOMY Left 07/20/2023   Procedure: BREAST LUMPECTOMY;  Surgeon: Amber Shoulders, MD;  Location: [facility];  Service: General;  Laterality: Left;  RE-EXCISION LUMPECTOMY LEFT BREAST   BREAST LUMPECTOMY  Left 08/16/2023   Procedure: REEXCISION LEFT BREAST LUMPECTOMY;  Surgeon: Amber Shoulders, MD;  Location: [facility];  Service: General;  Laterality: Left;  RE-EXCISION LEFT LUMPECTOMY   BREAST LUMPECTOMY WITH RADIOACTIVE SEED AND SENTINEL LYMPH NODE BIOPSY Left 07/12/2023   Procedure: RADIOACTIVE SEED GUIDED LEFT BREAST LUMPECTOMY WITH LEFT AXILLARY SENTINEL LYMPH NODE BIOPSY;  Surgeon: Amber Shoulders, MD;  Location: [facility];  Service: General;  Laterality: Left;   BTL  11/2008   Dr Amber Stephens; bilateral tubal ligation    CESAREAN  SECTION     x4 , with each child , has 4 children    DILATION AND CURETTAGE OF UTERUS  07/02/2012   MYOMECTOMY  2001   Dr Amber Stephens at [facility]    TUBAL LIGATION  2010    Family History  Problem Relation Age of Onset   Alzheimer's disease Mother        died at 19 (diagnosed at 70)   Stroke Mother    Hypertension Father    Kidney cancer Father 2   Breast cancer Maternal Aunt 65   Stroke Maternal Grandmother    Diabetes Maternal Grandmother    Diabetes Maternal Grandfather        (had bilateral amputation and blindness)   Colon cancer Maternal Grandfather 31   AAA (abdominal aortic aneurysm) Maternal Grandfather    Cancer Maternal Aunt    Cancer Maternal Aunt    Stomach cancer Neg Hx    Esophageal cancer Neg Hx     Social History   Socioeconomic History   Marital status: Married    Spouse name: Amber Stephens   Number of children: 4   Years of education: Not on file   Highest education level: Master's degree (e.g., MA, MS, MEng, MEd, MSW, MBA)  Occupational History   Occupation: Doctor, General Practice: [employer]  Tobacco Use   Smoking status: Never   Smokeless tobacco: Never  Vaping Use   Vaping status: Never Used  Substance and Sexual Activity   Alcohol use: Never   Drug use: No   Sexual activity: Yes    Partners: Male  Other Topics Concern   Not on file  Social History Narrative   4 kids (G4P3)      2002- son, Amber Stephens.   2003- Amber Stephens   2005-Amber Stephens   2010- Amber Stephens       [employer] faculty member (healthcare administration)         Also teaches Medical reception at [employer]      Enjoys reading, shopping, cooking      Married 21 years    Social Drivers of Corporate Investment Banker Strain: Low Risk  (12/13/2023)   Overall Financial Resource Strain (CARDIA)    Difficulty of Paying Living Expenses: Not hard at all  Food Insecurity: No Food Insecurity (12/13/2023)   Hunger Vital Sign    Worried About Running  Out of Food in the Last Year: Never true    Ran Out of Food in the Last Year: Never true  Transportation Needs: No Transportation Needs (12/13/2023)   PRAPARE - Administrator, Civil Service (Medical): No    Lack of Transportation (Non-Medical): No  Physical Activity: Insufficiently Active (12/13/2023)   Exercise Vital Sign    Days of Exercise per Week: 3 days    Minutes of Exercise per Session: 30 min  Stress: No Stress Concern Present (12/13/2023)   [employer] of  Occupational Health - Occupational Stress Questionnaire    Feeling of Stress: Not at all  Social Connections: Socially Integrated (12/13/2023)   Social Connection and Isolation Panel    Frequency of Communication with Friends and Family: Three times a week    Frequency of Social Gatherings with Friends and Family: More than three times a week    Attends Religious Services: More than 4 times per year    Active Member of [employer] or Organizations: Yes    Attends Engineer, Structural: More than 4 times per year    Marital Status: Married  Catering Manager Violence: Not At Risk (06/15/2023)   Humiliation, Afraid, Rape, and Kick questionnaire    Fear of Current or Ex-Partner: No    Emotionally Abused: No    Physically Abused: No    Sexually Abused: No    Outpatient Medications Prior to Visit  Medication Sig Dispense Refill   albuterol  (PROAIR  HFA) 108 (90 Base) MCG/ACT inhaler USE 1-2 PUFFS BY MOUTH EVERY 4 HOURS AS NEEDED wheezing and shortness of breath 3 each 0   amLODipine  (NORVASC ) 5 MG tablet TAKE 1 TABLET BY MOUTH EVERY DAY 90 tablet 1   anastrozole (ARIMIDEX) 1 MG tablet Take 1 tablet (1 mg total) by mouth daily. 90 tablet 1   Phentermine -Topiramate ER 7.5-46 MG CP24 Take one po daily 30 capsule 0   No facility-administered medications prior to visit.    Allergies  Allergen Reactions   Grass Pollen(K-O-R-T-Swt Vern) Shortness Of Breath   Phenol Shortness Of Breath   Other     Almonds    Wellbutrin  [Bupropion ] Other (See Comments)    tremors    Review of Systems  Constitutional:  Negative for weight loss.  HENT:  Negative for congestion and hearing loss.   Eyes:  Negative for blurred vision.  Respiratory:  Negative for cough.   Cardiovascular:  Negative for leg swelling.  Gastrointestinal:  Negative for constipation and diarrhea.  Genitourinary:  Negative for dysuria and frequency.  Musculoskeletal:  Negative for joint pain and myalgias.  Skin:  Negative for rash.  Neurological:  Negative for headaches.  Psychiatric/Behavioral:  Negative for depression. The patient is not nervous/anxious.        Objective:    Physical Exam   BP 126/79 (BP Location: Right Arm, Patient Position: Sitting, Cuff Size: Large)   Pulse 68   Temp 98.2 F (36.8 C) (Oral)   Resp 16   Ht 5' 2 (1.575 m)   Wt 194 lb (88 kg)   LMP 06/04/2017   SpO2 100%   BMI 35.48 kg/m  Wt Readings from Last 3 Encounters:  12/14/23 194 lb (88 kg)  11/30/23 190 lb (86.2 kg)  10/25/23 189 lb 9.6 oz (86 kg)  Physical Exam  Constitutional: She is oriented to person, place, and time. She appears well-developed and well-nourished. No distress.  HENT:  Head: Normocephalic and atraumatic.  Right Ear: Tympanic membrane and ear canal normal.  Left Ear: Tympanic membrane and ear canal normal.  Mouth/Throat: Oropharynx is clear and moist.  Eyes: Pupils are equal, round, and reactive to light. No scleral icterus.  Neck: Normal range of motion. No thyromegaly present.  Cardiovascular: Normal rate and regular rhythm.   No murmur heard. Pulmonary/Chest: Effort normal and breath sounds normal. No respiratory distress. He has no wheezes. She has no rales. She exhibits no tenderness.  Abdominal: Soft. Bowel sounds are normal. She exhibits no distension and no mass. There is no tenderness. There  is no rebound and no guarding.  Musculoskeletal: She exhibits no edema.  Lymphadenopathy:    She has no cervical  adenopathy.  Neurological: She is alert and oriented to person, place, and time. She exhibits normal muscle tone. Coordination normal.  Skin: Skin is warm and dry.  Psychiatric: She has a normal mood and affect. Her behavior is normal. Judgment and thought content normal.          Assessment & Plan:    Assessment and Plan Assessment & Plan              Assessment & Plan:       Assessment & Plan:   Problem List Items Addressed This Visit       Unprioritized   Physical exam, annual    Routine wellness visit with no concerns. Immunizations reviewed. Hepatitis B status uncertain. Pneumonia vaccine declined. Colonoscopy current. Labs normal except A1c slightly elevated at 5.8. Regular exercise resumed. No substance use. No sensory or musculoskeletal issues. - Continue regular exercise. - Maintain healthy diet. - No additional labs needed today.      Menopausal symptoms - Primary    Hot flashes and night sweats managed with medication. Discussed non-hormonal options. Gabapentin preferred for ease of discontinuation and sleep improvement. - Prescribed gabapentin at bedtime for night sweats. - Monitor for drowsiness and adjust dosing time if necessary.      Relevant Medications   gabapentin (NEURONTIN) 300 MG capsule   Hypertension    Hot flashes and night sweats managed with medication. Discussed non-hormonal options. Gabapentin preferred for ease of discontinuation and sleep improvement. - Prescribed gabapentin at bedtime for night sweats. - Monitor for drowsiness and adjust dosing time if necessary.       I am having Amber Stephens start on gabapentin. I am also having her maintain her albuterol , amLODipine , anastrozole, and Phentermine -Topiramate ER.  Meds ordered this encounter  Medications   gabapentin (NEURONTIN) 300 MG capsule    Sig: Take 1 capsule (300 mg total) by mouth at bedtime.    Dispense:  90 capsule    Refill:  1    Supervising  Provider:   DOMENICA BLACKBIRD A [[id number]]

## 2023-12-14 NOTE — Assessment & Plan Note (Signed)
  Hot flashes and night sweats managed with medication. Discussed non-hormonal options. Gabapentin preferred for ease of discontinuation and sleep improvement. - Prescribed gabapentin at bedtime for night sweats. - Monitor for drowsiness and adjust dosing time if necessary.

## 2023-12-14 NOTE — Assessment & Plan Note (Signed)
  Routine wellness visit with no concerns. Immunizations reviewed. Hepatitis B status uncertain. Pneumonia vaccine declined. Colonoscopy current. Labs normal except A1c slightly elevated at 5.8. Regular exercise resumed. No substance use. No sensory or musculoskeletal issues. - Continue regular exercise. - Maintain healthy diet. - No additional labs needed today.

## 2023-12-14 NOTE — Patient Instructions (Signed)
  VISIT SUMMARY: Today, you had your annual physical exam. We reviewed your overall health, discussed your current medications, and addressed any concerns you had. Your routine labs were normal except for a slightly elevated A1c level. We also talked about your menopausal symptoms and your history of breast cancer.  YOUR PLAN: -ADULT WELLNESS VISIT: This was a routine check-up to review your overall health. Your immunizations were reviewed, and it was noted that your Hepatitis B status is uncertain and you declined the pneumonia vaccine. Your colonoscopy is up to date, and your labs were normal except for a slightly elevated A1c level at 5.8, which indicates that your blood sugar is a bit high. You should continue regular exercise and maintain a healthy diet. No additional labs are needed today.  -MENOPAUSAL SYMPTOMS (HOT FLASHES, NIGHT SWEATS): Menopausal symptoms like hot flashes and night sweats are common and can be managed with medication. We discussed non-hormonal options and decided to prescribe gabapentin to be taken at bedtime to help with night sweats and improve sleep. Please monitor for any drowsiness and adjust the dosing time if necessary.  -HISTORY OF LEFT BREAST CANCER, STATUS POST RADIATION, ON LETROZOLE: You have completed radiation therapy for left-sided breast cancer and are currently taking letrozole. There are no new concerns regarding your breast cancer. Please continue your follow-up with your oncologist, Dr. Godina.  -HYPERTENSION: Hypertension, or high blood pressure, is being managed with amlodipine . Continue taking amlodipine  5 mg daily. We will schedule a follow-up in six months to monitor your blood pressure.  INSTRUCTIONS: Please continue your regular exercise and maintain a healthy diet. Take gabapentin at bedtime for night sweats and monitor for drowsiness. Continue taking amlodipine  5 mg daily for hypertension. Follow up with your oncologist, Dr. Godina, for your breast  cancer care. We will schedule a follow-up appointment in six months to monitor your blood pressure.                      Contains text generated by Abridge.                                 Contains text generated by Abridge.

## 2023-12-21 ENCOUNTER — Ambulatory Visit (INDEPENDENT_AMBULATORY_CARE_PROVIDER_SITE_OTHER): Admitting: Nurse Practitioner

## 2023-12-21 ENCOUNTER — Encounter: Payer: Self-pay | Admitting: Nurse Practitioner

## 2023-12-21 VITALS — BP 123/82 | HR 68 | Temp 98.3°F | Ht 62.0 in | Wt 188.0 lb

## 2023-12-21 DIAGNOSIS — E669 Obesity, unspecified: Secondary | ICD-10-CM | POA: Diagnosis not present

## 2023-12-21 DIAGNOSIS — I10 Essential (primary) hypertension: Secondary | ICD-10-CM | POA: Diagnosis not present

## 2023-12-21 DIAGNOSIS — E66811 Obesity, class 1: Secondary | ICD-10-CM

## 2023-12-21 DIAGNOSIS — R632 Polyphagia: Secondary | ICD-10-CM

## 2023-12-21 DIAGNOSIS — Z79899 Other long term (current) drug therapy: Secondary | ICD-10-CM

## 2023-12-21 DIAGNOSIS — Z6834 Body mass index (BMI) 34.0-34.9, adult: Secondary | ICD-10-CM | POA: Diagnosis not present

## 2023-12-21 MED ORDER — PHENTERMINE-TOPIRAMATE ER 7.5-46 MG PO CP24: ORAL_CAPSULE | ORAL | 0 refills | Status: DC

## 2023-12-21 NOTE — Progress Notes (Signed)
 Office: [phone]  /  Fax: [phone]  WEIGHT SUMMARY AND BIOMETRICS  Weight Lost Since Last Visit: 2lb  Weight Gained Since Last Visit: 0lb   Vitals Temp: 98.3 F (36.8 C) BP: 123/82 Pulse Rate: 68 SpO2: 99 %   Anthropometric Measurements Height: 5' 2 (1.575 m) Weight: 188 lb (85.3 kg) BMI (Calculated): 34.38 Weight at Last Visit: 190lb Weight Lost Since Last Visit: 2lb Weight Gained Since Last Visit: 0lb Starting Weight: 205lb Total Weight Loss (lbs): 17 lb (7.711 kg)   Body Composition  Body Fat %: 36.3 % Fat Mass (lbs): 68.4 lbs Muscle Mass (lbs): 114 lbs Total Body Water (lbs): 75.4 lbs Visceral Fat Rating : 9   Other Clinical Data Fasting: Yes Labs: No Today's Visit #: 45 Starting Date: 03/26/21     HPI  Chief Complaint: OBESITY  Amber Stephens is here to discuss her progress with her obesity treatment plan. She is on the the Category 2 Plan and states she is following her eating plan approximately 75 % of the time. She states she is exercising 30 minutes 4 days per week.   Interval History:  Since last office visit she has lost 2 pounds.  She started taking Gabapentin since her last visit. She is doing well with her meal plan.  She is walking on her treadmill 4 days per week.   Pharmacotherapy for weight loss: She is currently taking Qsymia dose 2 for medical weight loss.  Denies side effects.    Previous pharmacotherapy for medical weight loss:  Lomaira  & Wellbutrin  (stopped due to tremors)    Bariatric surgery:  Patient has not had bariatric surgery      Hypertension Hypertension improved.  Medication(s): norvasc  5mg . Denies side effects.   Denies chest pain, palpitations and SOB.  BP Readings from Last 3 Encounters:  12/21/23 123/82  12/14/23 126/79  11/30/23 136/86   Lab Results  Component Value Date   CREATININE 0.94 06/15/2023   CREATININE 1.00 05/04/2023   CREATININE 1.13 (H) 03/16/2023     PHYSICAL EXAM:  Blood pressure  123/82, pulse 68, temperature 98.3 F (36.8 C), height 5' 2 (1.575 m), weight 188 lb (85.3 kg), last menstrual period 06/04/2017, SpO2 99%. Body mass index is 34.39 kg/m.  General: She is overweight, cooperative, alert, well developed, and in no acute distress. PSYCH: Has normal mood, affect and thought process.   Extremities: No edema.  Neurologic: No gross sensory or motor deficits. No tremors or fasciculations noted.    DIAGNOSTIC DATA REVIEWED:  BMET    Component Value Date/Time   NA 140 06/15/2023 1220   NA 139 05/04/2023 1218   K 4.2 06/15/2023 1220   CL 106 06/15/2023 1220   CO2 29 06/15/2023 1220   GLUCOSE 96 06/15/2023 1220   BUN 20 06/15/2023 1220   BUN 13 05/04/2023 1218   CREATININE 0.94 06/15/2023 1220   CALCIUM 9.6 06/15/2023 1220   GFRNONAA >60 06/15/2023 1220   GFRAA >60 06/07/2017 0915   Lab Results  Component Value Date   HGBA1C 5.8 (H) 03/16/2023   HGBA1C 5.7 (H) 03/26/2021   Lab Results  Component Value Date   INSULIN  8.3 03/16/2023   INSULIN  19.1 03/26/2021   Lab Results  Component Value Date   TSH 0.741 03/16/2023   CBC    Component Value Date/Time   WBC 6.2 06/15/2023 1220   WBC 5.1 04/03/2019 1005   RBC 4.70 06/15/2023 1220   HGB 13.7 06/15/2023 1220   HGB 13.9 03/16/2023  1050   HCT 40.8 06/15/2023 1220   HCT 41.7 03/16/2023 1050   PLT 357 06/15/2023 1220   PLT 366 03/16/2023 1050   MCV 86.8 06/15/2023 1220   MCV 90 03/16/2023 1050   MCH 29.1 06/15/2023 1220   MCHC 33.6 06/15/2023 1220   RDW 13.3 06/15/2023 1220   RDW 12.4 03/16/2023 1050   Iron Studies    Component Value Date/Time   IRON 106 10/30/2012 1137   IRONPCTSAT 28.6 10/30/2012 1137   Lipid Panel     Component Value Date/Time   CHOL 160 03/16/2023 1050   TRIG 69 03/16/2023 1050   HDL 53 03/16/2023 1050   CHOLHDL 3 05/28/2020 0831   VLDL 11.4 05/28/2020 0831   LDLCALC 94 03/16/2023 1050   Hepatic Function Panel     Component Value Date/Time   PROT 7.7  06/15/2023 1220   PROT 7.2 03/16/2023 1050   ALBUMIN 4.5 06/15/2023 1220   ALBUMIN 4.4 03/16/2023 1050   AST 18 06/15/2023 1220   ALT 18 06/15/2023 1220   ALKPHOS 48 06/15/2023 1220   BILITOT 0.4 06/15/2023 1220   BILIDIR 0.1 04/03/2019 1005      Component Value Date/Time   TSH 0.741 03/16/2023 1050   Nutritional Lab Results  Component Value Date   VD25OH 39.0 03/16/2023   VD25OH 83.1 03/03/2022   VD25OH 72.4 08/05/2021     ASSESSMENT AND PLAN  TREATMENT PLAN FOR OBESITY:  Recommended Dietary Goals  Rashika is currently in the action stage of change. As such, her goal is to continue weight management plan. She has agreed to practicing portion control and making smarter food choices, such as increasing vegetables and decreasing simple carbohydrates.  Behavioral Intervention  We discussed the following Behavioral Modification Strategies today: increasing lean protein intake to established goals, decreasing simple carbohydrates , increasing vegetables, increasing fiber rich foods, increasing water intake , work on meal planning and preparation, reading food labels , keeping healthy foods at home, planning for success, celebration eating strategies, continue to work on maintaining a reduced calorie state, getting the recommended amount of protein, incorporating whole foods, making healthy choices, staying well hydrated and practicing mindfulness when eating., and increase protein intake, fibrous foods (25 grams per day for women, 30 grams for men) and water to improve satiety and decrease hunger signals. .  Additional resources provided today: NA  Recommended Physical Activity Goals  Nissi has been advised to work up to 150 minutes of moderate intensity aerobic activity a week and strengthening exercises 2-3 times per week for cardiovascular health, weight loss maintenance and preservation of muscle mass.   She has agreed to Think about enjoyable ways to increase daily  physical activity and overcoming barriers to exercise, Increase physical activity in their day and reduce sedentary time (increase NEAT)., Continue to gradually increase the amount and intensity of exercise routine, Increase volume of physical activity to a goal of 240 minutes a week, and Combine aerobic and strengthening exercises for efficiency and improved cardiometabolic health.   Pharmacotherapy We discussed various medication options to help Tiffney with her weight loss efforts and we both agreed to continue Qsymia dose 2.  Side effects discussed.  ASSOCIATED CONDITIONS ADDRESSED TODAY  Action/Plan  Primary hypertension BP looks good today.  Continue Norvasc .  Will consider a trial off of Norvasc  as she continues to lose weight.   Will continue to monitor while taking Qsymia.    Polyphagia -     Phentermine -Topiramate ER; Take one po daily  Dispense: 30 capsule; Refill: 0  Medication management -     Comprehensive metabolic panel with GFR  Obesity (BMI 30.0-34.9) -     Phentermine -Topiramate  ER; Take one po daily  Dispense: 30 capsule; Refill: 0       Will check labs at next visit  Return in about 4 weeks (around 01/18/2024).SABRA She was informed of the importance of frequent follow up visits to maximize her success with intensive lifestyle modifications for her multiple health conditions.   ATTESTASTION STATEMENTS:  Reviewed by clinician on day of visit: allergies, medications, problem list, medical history, surgical history, family history, social history, and previous encounter notes.     Corean SAUNDERS. Omega Slager FNP-C

## 2023-12-23 ENCOUNTER — Encounter: Payer: Self-pay | Admitting: Family

## 2023-12-23 DIAGNOSIS — R062 Wheezing: Secondary | ICD-10-CM

## 2023-12-23 MED ORDER — ALBUTEROL SULFATE HFA 108 (90 BASE) MCG/ACT IN AERS: INHALATION_SPRAY | RESPIRATORY_TRACT | 0 refills | Status: AC

## 2023-12-26 ENCOUNTER — Other Ambulatory Visit (HOSPITAL_COMMUNITY): Payer: Self-pay

## 2023-12-26 NOTE — Telephone Encounter (Signed)
 Copied from CRM [id number]. Topic: Clinical - Medication Prior Auth >> Dec 23, 2023  5:16 PM Alfonso ORN wrote: Reason for CRM: pre-auth for  Albuterol  needed for insurance

## 2023-12-27 ENCOUNTER — Ambulatory Visit: Admitting: Family

## 2023-12-27 VITALS — BP 118/77 | HR 80 | Temp 98.0°F | Resp 16 | Ht 62.0 in | Wt 195.0 lb

## 2023-12-27 DIAGNOSIS — J45909 Unspecified asthma, uncomplicated: Secondary | ICD-10-CM | POA: Diagnosis not present

## 2023-12-27 MED ORDER — PREDNISONE 10 MG PO TABS: ORAL_TABLET | ORAL | 0 refills | Status: DC

## 2023-12-27 MED ORDER — ALBUTEROL SULFATE (2.5 MG/3ML) 0.083% IN NEBU: 2.5000 mg | INHALATION_SOLUTION | Freq: Four times a day (QID) | RESPIRATORY_TRACT | 2 refills | Status: AC | PRN

## 2023-12-27 NOTE — Assessment & Plan Note (Signed)
  Likely triggered by dog exposure. Symptoms include wheezing and tightness. Expired albuterol  inhaler was ineffective. - Begin prednisone  taper. - Ordered nebulizer machine and solution. - Ensure updated albuterol  inhaler at home. - Advised to call if symptoms worsen or persist after 2-3 days on prednisone . - Discussed premedicating with Benadryl  and albuterol  before dog exposure.

## 2023-12-27 NOTE — Patient Instructions (Signed)
  VISIT SUMMARY: You were seen today for an asthma exacerbation, which was likely triggered by exposure to a friend's dog. You experienced wheezing and chest tightness, and your expired albuterol  inhaler was not effective in managing your symptoms.  YOUR PLAN: -ASTHMA EXACERBATION: An asthma exacerbation is a worsening of asthma symptoms, which can be triggered by allergens like pet dander. You should continue your prednisone  taper as prescribed. A nebulizer machine and solution have been ordered for you. Make sure you have an updated albuterol  inhaler at home. If your symptoms worsen or do not improve after 2-3 days on prednisone , please call us . We also discussed taking Benadryl  and albuterol  before being around dogs to help prevent future exacerbations.  INSTRUCTIONS: Please follow up if your symptoms worsen or do not improve after 2-3 days on prednisone . Ensure you have an updated albuterol  inhaler at home and use it as needed. Consider premedicating with Benadryl  and albuterol  before exposure to dogs.

## 2023-12-27 NOTE — Progress Notes (Signed)
Subjective:     Patient ID: BURMA KETCHER, female    DOB: 22-Apr-1975, 48 y.o.   MRN: [id number]  Chief Complaint  Patient presents with   Asthma    Patient complains of asthma flare up    Asthma Her past medical history is significant for asthma.    Discussed the use of AI scribe software for clinical note transcription with the patient, who gave verbal consent to proceed.  History of Present Illness Juliah CHRISTELLA Janak is a 48 year old female with asthma who presents with an asthma exacerbation.  She experienced an asthma exacerbation following exposure to a friend's dog on December 22, 2023. At that time, she had an expired albuterol  inhaler, which was ineffective in managing her symptoms.  Currently, she experiences ongoing wheezing and chest tightness.       Health Maintenance Due  Topic Date Due   HIV Screening  Never done   Hepatitis C Screening  Never done   Pneumococcal Vaccine (1 of 2 - PCV) Never done   COVID-19 Vaccine (5 - 2025-26 season) 10/03/2023   DTaP/Tdap/Td (2 - Td or Tdap) 12/04/2023    Past Medical History:  Diagnosis Date   Allergic rhinitis    Allergy    As a child   Anemia    borderline, prior to hysterectomy   Asthma    Breast cancer (HCC) 2025   Cancer (HCC)    Breast   Fatigue    History of pneumonia age 22 and agin   my mom said at age 72 and again at age 23    Hypertension 05/28/2020   Multiple food allergies    Shortness of breath    Shortness of breath on exertion     Past Surgical History:  Procedure Laterality Date   ABDOMINAL HYSTERECTOMY N/A 06/16/2017   Procedure: HYSTERECTOMY ABDOMINAL TOTAL;  Surgeon: Rosalynn LELON Ingle, MD;  Location: [facility];  Service: Gynecology;  Laterality: N/A;   BILATERAL SALPINGECTOMY Bilateral 06/16/2017   Procedure: BILATERAL SALPINGECTOMY;  Surgeon: Rosalynn LELON Ingle, MD;  Location: [facility];  Service: Gynecology;  Laterality: Bilateral;   BREAST BIOPSY  Left 06/06/2023   US  LT BREAST BX W LOC DEV 1ST LESION IMG BX SPEC US  GUIDE 06/06/2023 [facility]   BREAST BIOPSY  07/08/2023   MM LT RADIOACTIVE SEED LOC MAMMO GUIDE 07/08/2023 [facility]   BREAST LUMPECTOMY Left 07/20/2023   Procedure: BREAST LUMPECTOMY;  Surgeon: Aron Shoulders, MD;  Location: [facility];  Service: General;  Laterality: Left;  RE-EXCISION LUMPECTOMY LEFT BREAST   BREAST LUMPECTOMY Left 08/16/2023   Procedure: REEXCISION LEFT BREAST LUMPECTOMY;  Surgeon: Aron Shoulders, MD;  Location: [facility];  Service: General;  Laterality: Left;  RE-EXCISION LEFT LUMPECTOMY   BREAST LUMPECTOMY WITH RADIOACTIVE SEED AND SENTINEL LYMPH NODE BIOPSY Left 07/12/2023   Procedure: RADIOACTIVE SEED GUIDED LEFT BREAST LUMPECTOMY WITH LEFT AXILLARY SENTINEL LYMPH NODE BIOPSY;  Surgeon: Aron Shoulders, MD;  Location: [facility];  Service: General;  Laterality: Left;   BTL  11/2008   Dr Marget; bilateral tubal ligation    CESAREAN SECTION     x4 , with each child , has 4 children    DILATION AND CURETTAGE OF UTERUS  07/02/2012   MYOMECTOMY  2001   Dr Ingle Rosalynn at [facility]    TUBAL LIGATION  2010    Family History  Problem Relation Age of Onset   Alzheimer's  disease Mother        died at 67 (diagnosed at 71)   Stroke Mother    Hypertension Father    Kidney cancer Father 25   Breast cancer Maternal Aunt 41   Stroke Maternal Grandmother    Diabetes Maternal Grandmother    Diabetes Maternal Grandfather        (had bilateral amputation and blindness)   Colon cancer Maternal Grandfather 28   AAA (abdominal aortic aneurysm) Maternal Grandfather    Cancer Maternal Aunt    Cancer Maternal Aunt    Stomach cancer Neg Hx    Esophageal cancer Neg Hx     Social History   Socioeconomic History   Marital status: Married    Spouse name: marcus   Number of children: 4   Years of education: Not on file   Highest education level:  Master's degree (e.g., MA, MS, MEng, MEd, MSW, MBA)  Occupational History   Occupation: Doctor, General Practice: [employer]  Tobacco Use   Smoking status: Never   Smokeless tobacco: Never  Vaping Use   Vaping status: Never Used  Substance and Sexual Activity   Alcohol use: Never   Drug use: No   Sexual activity: Yes    Partners: Male  Other Topics Concern   Not on file  Social History Narrative   4 kids (G4P3)      2002- son, Jerona Raddle.   2003- Bobbi Yount   2005-[name]   2010- [name]       [employer] faculty member (healthcare administration)         Also teaches Medical reception at [employer]      Enjoys reading, shopping, cooking      Married 21 years    Social Drivers of [employer]: Low Risk  (12/13/2023)   Overall Financial Resource Strain (CARDIA)    Difficulty of Paying Living Expenses: Not hard at all  Food Insecurity: No Food Insecurity (12/13/2023)   Hunger Vital Sign    Worried About Running Out of Food in the Last Year: Never true    Ran Out of Food in the Last Year: Never true  Transportation Needs: No Transportation Needs (12/13/2023)   PRAPARE - Administrator, Civil Service (Medical): No    Lack of Transportation (Non-Medical): No  Physical Activity: Insufficiently Active (12/13/2023)   Exercise Vital Sign    Days of Exercise per Week: 3 days    Minutes of Exercise per Session: 30 min  Stress: No Stress Concern Present (12/13/2023)   [employer] of Occupational Health - Occupational Stress Questionnaire    Feeling of Stress: Not at all  Social Connections: Socially Integrated (12/13/2023)   Social Connection and Isolation Panel    Frequency of Communication with Friends and Family: Three times a week    Frequency of Social Gatherings with Friends and Family: More than three times a week    Attends Religious Services: More than 4 times per year    Active Member of [employer] or  Organizations: Yes    Attends Banker Meetings: More than 4 times per year    Marital Status: Married  Catering Manager Violence: Not At Risk (06/15/2023)   Humiliation, Afraid, Rape, and Kick questionnaire    Fear of Current or Ex-Partner: No    Emotionally Abused: No    Physically Abused: No    Sexually Abused: No    Outpatient Medications Prior to  Visit  Medication Sig Dispense Refill   albuterol  (PROAIR  HFA) 108 (90 Base) MCG/ACT inhaler USE 1-2 PUFFS BY MOUTH EVERY 4 HOURS AS NEEDED wheezing and shortness of breath 3 each 0   amLODipine  (NORVASC ) 5 MG tablet TAKE 1 TABLET BY MOUTH EVERY DAY 90 tablet 1   anastrozole  (ARIMIDEX ) 1 MG tablet Take 1 tablet (1 mg total) by mouth daily. 90 tablet 1   gabapentin  (NEURONTIN ) 300 MG capsule Take 1 capsule (300 mg total) by mouth at bedtime. 90 capsule 1   Phentermine -Topiramate  ER 7.5-46 MG CP24 Take one po daily 30 capsule 0   No facility-administered medications prior to visit.    Allergies  Allergen Reactions   Grass Pollen(K-O-R-T-Swt Vern) Shortness Of Breath   Phenol Shortness Of Breath   Other     Almonds   Wellbutrin  [Bupropion ] Other (See Comments)    tremors    ROS See HPI    Objective:    Physical Exam Pulmonary:     Effort: Pulmonary effort is normal.     Breath sounds: Examination of the right-upper field reveals wheezing. Examination of the left-upper field reveals wheezing. Examination of the right-middle field reveals wheezing. Examination of the left-middle field reveals wheezing. Wheezing present. No rhonchi.  Neurological:     Mental Status: She is alert.      BP 118/77 (BP Location: Right Arm, Patient Position: Sitting, Cuff Size: Large)   Pulse 80   Temp 98 F (36.7 C) (Oral)   Resp 16   Ht 5' 2 (1.575 m)   Wt 195 lb (88.5 kg)   LMP 06/04/2017   SpO2 100%   BMI 35.67 kg/m  Wt Readings from Last 3 Encounters:  12/27/23 195 lb (88.5 kg)  12/21/23 188 lb (85.3 kg)  12/14/23 194  lb (88 kg)       Assessment & Plan:   Problem List Items Addressed This Visit       Unprioritized   Acute asthma - Primary    Likely triggered by dog exposure. Symptoms include wheezing and tightness. Expired albuterol  inhaler was ineffective. - Begin prednisone  taper. - Ordered nebulizer machine and solution. - Ensure updated albuterol  inhaler at home. - Advised to call if symptoms worsen or persist after 2-3 days on prednisone . - Discussed premedicating with Benadryl  and albuterol  before dog exposure.      Relevant Medications   albuterol  (PROVENTIL ) (2.5 MG/3ML) 0.083% nebulizer solution   predniSONE  (DELTASONE ) 10 MG tablet   Other Relevant Orders   For home use only DME Nebulizer machine   Assessment & Plan     I am having Josee M. Mandrell start on albuterol  and predniSONE . I am also having her maintain her amLODipine , anastrozole , gabapentin , Phentermine -Topiramate  ER, and albuterol .  Meds ordered this encounter  Medications   albuterol  (PROVENTIL ) (2.5 MG/3ML) 0.083% nebulizer solution    Sig: Take 3 mLs (2.5 mg total) by nebulization every 6 (six) hours as needed for wheezing or shortness of breath.    Dispense:  75 mL    Refill:  2    Supervising Provider:   DOMENICA BLACKBIRD A [[id number]]   predniSONE  (DELTASONE ) 10 MG tablet    Sig: 4 tabs by mouth once daily for 2 days, then 3 tabs daily x 2 days, then 2 tabs daily x 2 days, then 1 tab daily x 2 days    Dispense:  20 tablet    Refill:  0    Supervising Provider:   DOMENICA BLACKBIRD A [  [id number]]   

## 2024-01-11 ENCOUNTER — Ambulatory Visit: Admitting: Rehabilitation

## 2024-01-18 ENCOUNTER — Ambulatory Visit: Admitting: Nurse Practitioner

## 2024-01-18 ENCOUNTER — Encounter: Payer: Self-pay | Admitting: Nurse Practitioner

## 2024-01-18 ENCOUNTER — Inpatient Hospital Stay

## 2024-01-18 ENCOUNTER — Encounter: Payer: Self-pay | Admitting: Adult Health

## 2024-01-18 ENCOUNTER — Inpatient Hospital Stay: Attending: Licensed Clinical Social Worker | Admitting: Adult Health

## 2024-01-18 VITALS — BP 134/86 | HR 85 | Temp 98.0°F | Resp 18 | Ht 62.0 in | Wt 197.9 lb

## 2024-01-18 VITALS — BP 116/83 | HR 76 | Temp 98.2°F | Ht 62.0 in | Wt 193.0 lb

## 2024-01-18 DIAGNOSIS — Z170 Estrogen receptor positive status [ER+]: Secondary | ICD-10-CM | POA: Insufficient documentation

## 2024-01-18 DIAGNOSIS — Z6835 Body mass index (BMI) 35.0-35.9, adult: Secondary | ICD-10-CM

## 2024-01-18 DIAGNOSIS — E66811 Obesity, class 1: Secondary | ICD-10-CM

## 2024-01-18 DIAGNOSIS — E669 Obesity, unspecified: Secondary | ICD-10-CM

## 2024-01-18 DIAGNOSIS — E2839 Other primary ovarian failure: Secondary | ICD-10-CM | POA: Diagnosis not present

## 2024-01-18 DIAGNOSIS — Z1732 Human epidermal growth factor receptor 2 negative status: Secondary | ICD-10-CM | POA: Diagnosis not present

## 2024-01-18 DIAGNOSIS — Z923 Personal history of irradiation: Secondary | ICD-10-CM | POA: Insufficient documentation

## 2024-01-18 DIAGNOSIS — Z79811 Long term (current) use of aromatase inhibitors: Secondary | ICD-10-CM | POA: Insufficient documentation

## 2024-01-18 DIAGNOSIS — C50412 Malignant neoplasm of upper-outer quadrant of left female breast: Secondary | ICD-10-CM | POA: Diagnosis not present

## 2024-01-18 DIAGNOSIS — Z1721 Progesterone receptor positive status: Secondary | ICD-10-CM | POA: Diagnosis not present

## 2024-01-18 DIAGNOSIS — R632 Polyphagia: Secondary | ICD-10-CM

## 2024-01-18 MED ORDER — PHENTERMINE-TOPIRAMATE ER 7.5-46 MG PO CP24: ORAL_CAPSULE | ORAL | 0 refills | Status: DC

## 2024-01-18 NOTE — Progress Notes (Signed)
 Office: [phone]  /  Fax: [phone]  WEIGHT SUMMARY AND BIOMETRICS  Weight Lost Since Last Visit: 0lb  Weight Gained Since Last Visit: 5lb   Vitals Temp: 98.2 F (36.8 C) BP: 116/83 Pulse Rate: 76 SpO2: 100 %   Anthropometric Measurements Height: 5' 2 (1.575 m) Weight: 193 lb (87.5 kg) BMI (Calculated): 35.29 Weight at Last Visit: 188lb Weight Lost Since Last Visit: 0lb Weight Gained Since Last Visit: 5lb Starting Weight: 205lb Total Weight Loss (lbs): 12 lb (5.443 kg)   Body Composition  Body Fat %: 38.1 % Fat Mass (lbs): 73.6 lbs Muscle Mass (lbs): 113.6 lbs Total Body Water (lbs): 77 lbs Visceral Fat Rating : 10   Other Clinical Data Fasting: Yes Labs: Yes Today's Visit #: 42 Starting Date: 03/26/21     HPI  Chief Complaint: OBESITY  Amber Stephens is here to discuss her progress with her obesity treatment plan. She is on the the Category 2 Plan and states she is following her eating plan approximately 5 % of the time. She states she is exercising 0 minutes 0 days per week.   Interval History:  Since last office visit she has gained 5 pounds. She saw her PCP on 12/27/23 for her asthma.  Took a prednisone  taper and wasn't able to exercise for 3 weeks.  She has been struggling with polyphagia and increase in sweets cravings since taking the prednisone . She goes out to eat once per week. She is drinking water and lemonade 1/2 glass at dinner. Denies sodas or sweet tea.   She is starting to overall feel better and plans to start back exercising.   Pharmacotherapy for weight loss: She is currently taking Qsymia  dose 2 for medical weight loss.  Denies side effects.     Previous pharmacotherapy for medical weight loss:  Lomaira  & Wellbutrin  (stopped due to tremors)    Bariatric surgery:  Patient has not had bariatric surgery   PHYSICAL EXAM:  Blood pressure 116/83, pulse 76, temperature 98.2 F (36.8 C), height 5' 2 (1.575 m), weight 193 lb (87.5 kg),  last menstrual period 06/04/2017, SpO2 100%. Body mass index is 35.3 kg/m.  General: She is overweight, cooperative, alert, well developed, and in no acute distress. PSYCH: Has normal mood, affect and thought process.   Extremities: No edema.  Neurologic: No gross sensory or motor deficits. No tremors or fasciculations noted.    DIAGNOSTIC DATA REVIEWED:  BMET    Component Value Date/Time   NA 140 06/15/2023 1220   NA 139 05/04/2023 1218   K 4.2 06/15/2023 1220   CL 106 06/15/2023 1220   CO2 29 06/15/2023 1220   GLUCOSE 96 06/15/2023 1220   BUN 20 06/15/2023 1220   BUN 13 05/04/2023 1218   CREATININE 0.94 06/15/2023 1220   CALCIUM 9.6 06/15/2023 1220   GFRNONAA >60 06/15/2023 1220   GFRAA >60 06/07/2017 0915   Lab Results  Component Value Date   HGBA1C 5.8 (H) 03/16/2023   HGBA1C 5.7 (H) 03/26/2021   Lab Results  Component Value Date   INSULIN  8.3 03/16/2023   INSULIN  19.1 03/26/2021   Lab Results  Component Value Date   TSH 0.741 03/16/2023   CBC    Component Value Date/Time   WBC 6.2 06/15/2023 1220   WBC 5.1 04/03/2019 1005   RBC 4.70 06/15/2023 1220   HGB 13.7 06/15/2023 1220   HGB 13.9 03/16/2023 1050   HCT 40.8 06/15/2023 1220   HCT 41.7 03/16/2023 1050   PLT  357 06/15/2023 1220   PLT 366 03/16/2023 1050   MCV 86.8 06/15/2023 1220   MCV 90 03/16/2023 1050   MCH 29.1 06/15/2023 1220   MCHC 33.6 06/15/2023 1220   RDW 13.3 06/15/2023 1220   RDW 12.4 03/16/2023 1050   Iron Studies    Component Value Date/Time   IRON 106 10/30/2012 1137   IRONPCTSAT 28.6 10/30/2012 1137   Lipid Panel     Component Value Date/Time   CHOL 160 03/16/2023 1050   TRIG 69 03/16/2023 1050   HDL 53 03/16/2023 1050   CHOLHDL 3 05/28/2020 0831   VLDL 11.4 05/28/2020 0831   LDLCALC 94 03/16/2023 1050   Hepatic Function Panel     Component Value Date/Time   PROT 7.7 06/15/2023 1220   PROT 7.2 03/16/2023 1050   ALBUMIN 4.5 06/15/2023 1220   ALBUMIN 4.4 03/16/2023  1050   AST 18 06/15/2023 1220   ALT 18 06/15/2023 1220   ALKPHOS 48 06/15/2023 1220   BILITOT 0.4 06/15/2023 1220   BILIDIR 0.1 04/03/2019 1005      Component Value Date/Time   TSH 0.741 03/16/2023 1050   Nutritional Lab Results  Component Value Date   VD25OH 39.0 03/16/2023   VD25OH 83.1 03/03/2022   VD25OH 72.4 08/05/2021     ASSESSMENT AND PLAN  TREATMENT PLAN FOR OBESITY:  Recommended Dietary Goals  Amber Stephens is currently in the action stage of change. As such, her goal is to continue weight management plan. She has agreed to practicing portion control and making smarter food choices, such as increasing vegetables and decreasing simple carbohydrates.  Behavioral Intervention  We discussed the following Behavioral Modification Strategies today: increasing lean protein intake to established goals, decreasing simple carbohydrates , increasing vegetables, increasing fiber rich foods, increasing water intake , reading food labels , keeping healthy foods at home, celebration eating strategies, continue to work on maintaining a reduced calorie state, getting the recommended amount of protein, incorporating whole foods, making healthy choices, staying well hydrated and practicing mindfulness when eating., and increase protein intake, fibrous foods (25 grams per day for women, 30 grams for men) and water to improve satiety and decrease hunger signals. .  Additional resources provided today: NA  Recommended Physical Activity Goals  Amber Stephens has been advised to work up to 150 minutes of moderate intensity aerobic activity a week and strengthening exercises 2-3 times per week for cardiovascular health, weight loss maintenance and preservation of muscle mass.   She has agreed to Think about enjoyable ways to increase daily physical activity and overcoming barriers to exercise, Increase physical activity in their day and reduce sedentary time (increase NEAT)., Start strengthening exercises  with a goal of 2-3 sessions a week , Start aerobic activity with a goal of 150 minutes a week at moderate intensity. , Work on scheduling and tracking physical activity. , and Combine aerobic and strengthening exercises for efficiency and improved cardiometabolic health.   Pharmacotherapy We discussed various medication options to help Amber Stephens with her weight loss efforts and we both agreed to continue Qsymia  dose 2.  Side effects discussed.  Will continue to monitor BP while taking.  ASSOCIATED CONDITIONS ADDRESSED TODAY  Action/Plan  Polyphagia -     Phentermine -Topiramate  ER; Take one po daily  Dispense: 30 capsule; Refill: 0  Obesity (BMI 30.0-34.9) -     Phentermine -Topiramate  ER; Take one po daily  Dispense: 30 capsule; Refill: 0      We discussed how she is doing since starting taking  Gabapentin  for night sweats.  A side effect is weight gain.  She would like to see how she does over the next month.  She feels her recent weight gain was due to taking prednisone .  At her next visit if she is still gaining weight, she will reach out to her PCP about decreasing her Gabapentin  dose.     Return in about 4 weeks (around 02/15/2024).Amber Stephens She was informed of the importance of frequent follow up visits to maximize her success with intensive lifestyle modifications for her multiple health conditions.   ATTESTASTION STATEMENTS:  Reviewed by clinician on day of visit: allergies, medications, problem list, medical history, surgical history, family history, social history, and previous encounter notes.    Corean SAUNDERS. Makita Blow FNP-C

## 2024-01-18 NOTE — Progress Notes (Unsigned)
 SURVIVORSHIP VISIT:  BRIEF ONCOLOGIC HISTORY:  Oncology History  Malignant neoplasm of upper-outer quadrant of left breast in female, estrogen receptor positive (HCC)  06/06/2023 Initial Diagnosis   Screening mammogram detected left breast asymmetry and distortion at 12:00 spiculated mass by ultrasound measuring 1.5 cm, biopsy grade 2 IDC ER 100% PR 100% Ki67 1%, HER2 negative   06/15/2023 Cancer Staging   Staging form: Breast, AJCC 8th Edition - Clinical: Stage IA (cT1c, cN0, cM0, G2, ER+, PR+, HER2-) - Signed by Odean Potts, MD on 06/15/2023 Stage prefix: Initial diagnosis Histologic grading system: 3 grade system    Genetic Testing   [facility] CancerNext-Expanded Panel+RNA was Negative. Report date is 06/27/2023.   The CancerNext-Expanded gene panel offered by [facility] and includes sequencing, rearrangement, and RNA analysis for the following 77 genes: AIP, ALK, APC, ATM, AXIN2, BAP1, BARD1, BMPR1A, BRCA1, BRCA2, BRIP1, CDC73, CDH1, CDK4, CDKN1B, CDKN2A, CEBPA, CHEK2, CTNNA1, DDX41, DICER1, ETV6, FH, FLCN, GATA2, LZTR1, MAX, MBD4, MEN1, MET, MLH1, MSH2, MSH3, MSH6, MUTYH, NF1, NF2, NTHL1, PALB2, PHOX2B, PMS2, POT1, PRKAR1A, PTCH1, PTEN, RAD51C, RAD51D, RB1, RET, RPS20, RUNX1, SDHA, SDHAF2, SDHB, SDHC, SDHD, SMAD4, SMARCA4, SMARCB1, SMARCE1, STK11, SUFU, TMEM127, TP53, TSC1, TSC2, VHL, and WT1 (sequencing and deletion/duplication); EGFR, HOXB13, KIT, MITF, PDGFRA, POLD1, and POLE (sequencing only); EPCAM and GREM1 (deletion/duplication only).     09/27/2023 - 10/25/2023 Radiation Therapy   Plan Name: Breast_L_BH Site: Breast, Left Technique: 3D Mode: Photon Dose Per Fraction: 2.67 Gy Prescribed Dose (Delivered / Prescribed): 40.05 Gy / 40.05 Gy Prescribed Fxs (Delivered / Prescribed): 15 / 15   Plan Name: Brst_L_Bst_BH Site: Breast, Left Technique: 3D Mode: Photon Dose Per Fraction: 2 Gy Prescribed Dose (Delivered / Prescribed): 10 Gy / 10 Gy Prescribed Fxs (Delivered / Prescribed):  5 / 5   11/2023 -  Anti-estrogen oral therapy   Anastrozole      INTERVAL HISTORY:  Amber Stephens to review her survivorship care plan detailing her treatment course for breast cancer, as well as monitoring long-term side effects of that treatment, education regarding health maintenance, screening, and overall wellness and health promotion.     Overall, Amber Stephens reports feeling quite well   REVIEW OF SYSTEMS:  Review of Systems - Oncology Breast: Denies any new nodularity, masses, tenderness, nipple changes, or nipple discharge.       PAST MEDICAL/SURGICAL HISTORY:  Past Medical History:  Diagnosis Date   Allergic rhinitis    Allergy    As a child   Anemia    borderline, prior to hysterectomy   Asthma    Breast cancer (HCC) 2025   Cancer (HCC)    Breast   Fatigue    History of pneumonia age 59 and agin   my mom said at age 59 and again at age 70    Hypertension 05/28/2020   Multiple food allergies    Shortness of breath    Shortness of breath on exertion    Past Surgical History:  Procedure Laterality Date   ABDOMINAL HYSTERECTOMY N/A 06/16/2017   Procedure: HYSTERECTOMY ABDOMINAL TOTAL;  Surgeon: Rosalynn LELON Ingle, MD;  [facility];  Service: Gynecology;  Laterality: N/A;   BILATERAL SALPINGECTOMY Bilateral 06/16/2017   Procedure: BILATERAL SALPINGECTOMY;  Surgeon: Rosalynn LELON Ingle, MD;  Location: [facility];  Service: Gynecology;  Laterality: Bilateral;   BREAST BIOPSY Left 06/06/2023   US  LT BREAST BX W LOC DEV 1ST LESION IMG BX SPEC US  GUIDE 06/06/2023 [facility] MAMMOGRAPHY   BREAST BIOPSY  07/08/2023   MM LT RADIOACTIVE SEED LOC MAMMO GUIDE 07/08/2023 [facility] MAMMOGRAPHY   BREAST LUMPECTOMY Left 07/20/2023   Procedure: BREAST LUMPECTOMY;  Surgeon: Aron Shoulders, MD;  Location: [facility];  Service: General;  Laterality: Left;  RE-EXCISION LUMPECTOMY LEFT BREAST   BREAST LUMPECTOMY Left 08/16/2023    Procedure: REEXCISION LEFT BREAST LUMPECTOMY;  Surgeon: Aron Shoulders, MD;  Location: [facility];  Service: General;  Laterality: Left;  RE-EXCISION LEFT LUMPECTOMY   BREAST LUMPECTOMY WITH RADIOACTIVE SEED AND SENTINEL LYMPH NODE BIOPSY Left 07/12/2023   Procedure: RADIOACTIVE SEED GUIDED LEFT BREAST LUMPECTOMY WITH LEFT AXILLARY SENTINEL LYMPH NODE BIOPSY;  Surgeon: Aron Shoulders, MD;  Location: [facility];  Service: General;  Laterality: Left;   BTL  11/2008   Dr Marget; bilateral tubal ligation    CESAREAN SECTION     x4 , with each child , has 4 children    DILATION AND CURETTAGE OF UTERUS  07/02/2012   MYOMECTOMY  2001   Dr [facility]    TUBAL LIGATION  2010     ALLERGIES:  Allergies[1]   CURRENT MEDICATIONS:  Outpatient Encounter Medications as of 01/18/2024  Medication Sig   albuterol  (PROAIR  HFA) 108 (90 Base) MCG/ACT inhaler USE 1-2 PUFFS BY MOUTH EVERY 4 HOURS AS NEEDED wheezing and shortness of breath   albuterol  (PROVENTIL ) (2.5 MG/3ML) 0.083% nebulizer solution Take 3 mLs (2.5 mg total) by nebulization every 6 (six) hours as needed for wheezing or shortness of breath.   amLODipine  (NORVASC ) 5 MG tablet TAKE 1 TABLET BY MOUTH EVERY DAY   anastrozole  (ARIMIDEX ) 1 MG tablet Take 1 tablet (1 mg total) by mouth daily.   gabapentin  (NEURONTIN ) 300 MG capsule Take 1 capsule (300 mg total) by mouth at bedtime.   Phentermine -Topiramate  ER 7.5-46 MG CP24 Take one po daily   [DISCONTINUED] Phentermine -Topiramate  ER 7.5-46 MG CP24 Take one po daily   [DISCONTINUED] predniSONE  (DELTASONE ) 10 MG tablet 4 tabs by mouth once daily for 2 days, then 3 tabs daily x 2 days, then 2 tabs daily x 2 days, then 1 tab daily x 2 days (Patient not taking: Reported on 01/18/2024)   No facility-administered encounter medications on file as of 01/18/2024.     ONCOLOGIC FAMILY HISTORY:  Family History  Problem Relation Age of Onset    Alzheimer's disease Mother        died at 48 (diagnosed at 14)   Stroke Mother    Hypertension Father    Kidney cancer Father 62   Breast cancer Maternal Aunt 55   Stroke Maternal Grandmother    Diabetes Maternal Grandmother    Diabetes Maternal Grandfather        (had bilateral amputation and blindness)   Colon cancer Maternal Grandfather 24   AAA (abdominal aortic aneurysm) Maternal Grandfather    Cancer Maternal Aunt    Cancer Maternal Aunt    Stomach cancer Neg Hx    Esophageal cancer Neg Hx      SOCIAL HISTORY:  Social History   Socioeconomic History   Marital status: Married    Spouse name: marcus   Number of children: 4   Years of education: Not on file   Highest education level: Master's degree (e.g., MA, MS, MEng, MEd, MSW, MBA)  Occupational History   Occupation: Doctor, General Practice: [employer]  Tobacco Use   Smoking status: Never   Smokeless tobacco: Never  Vaping Use  Vaping status: Never Used  Substance and Sexual Activity   Alcohol use: Never   Drug use: No   Sexual activity: Yes    Partners: Male  Other Topics Concern   Not on file  Social History Narrative   4 kids (G4P3)      2002- son, Jerona Raddle.   2003- Aizza Santiago   2005-[name]   2010- [name]       Erik Balm faculty member (healthcare administration)         Also teaches Medical reception at [employer]      Enjoys reading, shopping, cooking      Married 21 years    Social Drivers of Health   Tobacco Use: Low Risk (01/18/2024)   Patient History    Smoking Tobacco Use: Never    Smokeless Tobacco Use: Never    Passive Exposure: Not on file  Financial Resource Strain: Low Risk (12/13/2023)   Overall Financial Resource Strain (CARDIA)    Difficulty of Paying Living Expenses: Not hard at all  Food Insecurity: No Food Insecurity (12/13/2023)   Epic    Worried About Programme Researcher, Broadcasting/film/video in the Last Year: Never true     Ran Out of Food in the Last Year: Never true  Transportation Needs: No Transportation Needs (12/13/2023)   Epic    Lack of Transportation (Medical): No    Lack of Transportation (Non-Medical): No  Physical Activity: Insufficiently Active (12/13/2023)   Exercise Vital Sign    Days of Exercise per Week: 3 days    Minutes of Exercise per Session: 30 min  Stress: No Stress Concern Present (12/13/2023)   [employer] of Occupational Health - Occupational Stress Questionnaire    Feeling of Stress: Not at all  Social Connections: Socially Integrated (12/13/2023)   Social Connection and Isolation Panel    Frequency of Communication with Friends and Family: Three times a week    Frequency of Social Gatherings with Friends and Family: More than three times a week    Attends Religious Services: More than 4 times per year    Active Member of [employer] or Organizations: Yes    Attends Engineer, Structural: More than 4 times per year    Marital Status: Married  Catering Manager Violence: Not At Risk (06/15/2023)   Humiliation, Afraid, Rape, and Kick questionnaire    Fear of Current or Ex-Partner: No    Emotionally Abused: No    Physically Abused: No    Sexually Abused: No  Depression (PHQ2-9): Low Risk (08/08/2023)   Depression (PHQ2-9)    PHQ-2 Score: 0  Alcohol Screen: Not on file  Housing: Low Risk (12/13/2023)   Epic    Unable to Pay for Housing in the Last Year: No    Number of Times Moved in the Last Year: 0    Homeless in the Last Year: No  Utilities: Not At Risk (06/15/2023)   [facility] Utilities    Threatened with loss of utilities: No  Health Literacy: Not on file     OBSERVATIONS/OBJECTIVE:  BP 134/86   Pulse 85   Temp 98 F (36.7 C) (Temporal)   Resp 18   Ht 5' 2 (1.575 m)   Wt 197 lb 14.4 oz (89.8 kg)   LMP 06/04/2017   SpO2 100%   BMI 36.20 kg/m  GENERAL: Patient is a well appearing female in no acute distress HEENT:  Sclerae anicteric.   Oropharynx clear and moist. No ulcerations or evidence of oropharyngeal  candidiasis. Neck is supple.  NODES:  No cervical, supraclavicular, or axillary lymphadenopathy palpated.  BREAST EXAM:  Deferred. LUNGS:  Clear to auscultation bilaterally.  No wheezes or rhonchi. HEART:  Regular rate and rhythm. No murmur appreciated. ABDOMEN:  Soft, nontender.  Positive, normoactive bowel sounds. No organomegaly palpated. MSK:  No focal spinal tenderness to palpation. Full range of motion bilaterally in the upper extremities. EXTREMITIES:  No peripheral edema.   SKIN:  Clear with no obvious rashes or skin changes. No nail dyscrasia. NEURO:  Nonfocal. Well oriented.  Appropriate affect.   LABORATORY DATA:  None for this visit.  DIAGNOSTIC IMAGING:  None for this visit.      ASSESSMENT AND PLAN:  Amber Stephens is a pleasant 48 y.o. female with Stage *** right/left breast invasive ductal carcinoma, ER+/PR+/HER2-, diagnosed in ***, treated with lumpectomy, adjuvant radiation therapy, and anti-estrogen therapy with *** beginning in ***.  She presents to the Survivorship Clinic for our initial meeting and routine follow-up post-completion of treatment for breast cancer.    1. Stage *** right/left breast cancer:  Amber Stephens is continuing to recover from definitive treatment for breast cancer. She will follow-up with her medical oncologist, Dr.  PIERRETTE with history and physical exam per surveillance protocol.  She will continue her anti-estrogen therapy with ***. Thus far, she is tolerating the *** well, with minimal side effects. Her mammogram is due ***; orders placed today.   Today, a comprehensive survivorship care plan and treatment summary was reviewed with the patient today detailing her breast cancer diagnosis, treatment course, potential late/long-term effects of treatment, appropriate follow-up care with recommendations for the future, and patient education resources.  A copy of this summary, along  with a letter will be sent to the patients primary care provider via mail/fax/In Basket message after todays visit.    #. Problem(s) at Visit______________  #. Bone health:  Given Amber Stephens age/history of breast cancer and her current treatment regimen including anti-estrogen therapy with ***, she is at risk for bone demineralization.  Her last DEXA scan was ***, which showed ***.  In the meantime, she was encouraged to increase her consumption of foods rich in calcium, as well as increase her weight-bearing activities.  She was given education on specific activities to promote bone health.  #. Cancer screening:  Due to Amber Stephens history and her age, she should receive screening for skin cancers, colon cancer, and gynecologic cancers.  The information and recommendations are listed on the patient's comprehensive care plan/treatment summary and were reviewed in detail with the patient.    #. Health maintenance and wellness promotion: Amber Stephens was encouraged to consume 5-7 servings of fruits and vegetables per day. We reviewed the Nutrition Rainbow handout.  She was also encouraged to engage in moderate to vigorous exercise for 30 minutes per day most days of the week.  She was instructed to limit her alcohol consumption and continue to abstain from tobacco use/***was encouraged stop smoking.     #. Support services/counseling: It is not uncommon for this period of the patient's cancer care trajectory to be one of many emotions and stressors.   She was given information regarding our available services and encouraged to contact me with any questions or for help enrolling in any of our support group/programs.    Follow up instructions:    -Return to cancer center ***  -Mammogram due in *** -She is welcome to return back to the Survivorship Clinic at any time; no  additional follow-up needed at this time.  -Consider referral back to survivorship as a long-term survivor for continued  surveillance  The patient was provided an opportunity to ask questions and all were answered. The patient agreed with the plan and demonstrated an understanding of the instructions.   Total encounter time:*** minutes*in face-to-face visit time, chart review, lab review, care coordination, order entry, and documentation of the encounter time.    Morna Kendall, NP 01/18/2024 10:26 AM Medical Oncology and Hematology [facility] [address] [place], [place] [address] Tel. [phone]    Fax. [phone]  *Total Encounter Time as defined by the Centers for Medicare and Medicaid Services includes, in addition to the face-to-face time of a patient visit (documented in the note above) non-face-to-face time: obtaining and reviewing outside history, ordering and reviewing medications, tests or procedures, care coordination (communications with other health care professionals or caregivers) and documentation in the medical record.      [1] Allergies Allergen Reactions   Grass Pollen(K-O-R-T-Swt Vern) Shortness Of Breath   Phenol Shortness Of Breath   Other     Almonds   Wellbutrin  [Bupropion ] Other (See Comments)    tremors

## 2024-01-18 NOTE — Progress Notes (Signed)
 [employer] requisition complete and given to lab.

## 2024-01-19 ENCOUNTER — Telehealth: Payer: Self-pay | Admitting: Hematology and Oncology

## 2024-01-19 ENCOUNTER — Ambulatory Visit: Payer: Self-pay | Admitting: Nurse Practitioner

## 2024-01-19 LAB — COMPREHENSIVE METABOLIC PANEL WITH GFR
ALT: 14 IU/L (ref 0–32)
AST: 15 IU/L (ref 0–40)
Albumin: 4.3 g/dL (ref 3.9–4.9)
Alkaline Phosphatase: 65 IU/L (ref 41–116)
BUN/Creatinine Ratio: 15 (ref 9–23)
BUN: 17 mg/dL (ref 6–24)
Bilirubin Total: 0.4 mg/dL (ref 0.0–1.2)
CO2: 23 mmol/L (ref 20–29)
Calcium: 9.7 mg/dL (ref 8.7–10.2)
Chloride: 106 mmol/L (ref 96–106)
Creatinine, Ser: 1.1 mg/dL — ABNORMAL HIGH (ref 0.57–1.00)
Globulin, Total: 2.7 g/dL (ref 1.5–4.5)
Glucose: 91 mg/dL (ref 70–99)
Potassium: 4.2 mmol/L (ref 3.5–5.2)
Sodium: 141 mmol/L (ref 134–144)
Total Protein: 7 g/dL (ref 6.0–8.5)
eGFR: 62 mL/min/1.73 (ref >59)

## 2024-01-19 NOTE — Telephone Encounter (Signed)
 Left vm for pt about scheduled appt date and time. Encouraged to call back if need to reschedule

## 2024-01-23 ENCOUNTER — Ambulatory Visit: Attending: General Surgery

## 2024-01-23 VITALS — Wt 197.5 lb

## 2024-01-23 DIAGNOSIS — Z483 Aftercare following surgery for neoplasm: Secondary | ICD-10-CM | POA: Insufficient documentation

## 2024-01-23 NOTE — Therapy (Signed)
 " OUTPATIENT PHYSICAL THERAPY SOZO SCREENING NOTE   Patient Name: Amber Stephens MRN: [id number] DOB:01-06-1976, 48 y.o., female Today's Date: 01/23/2024  PCP: Daryl Setter, NP REFERRING PROVIDER: Aron Shoulders, MD   PT End of Session - 01/23/24 1056     Visit Number 2   # unchanged due to screen only   PT Start Time 1055    PT Stop Time 1059    PT Time Calculation (min) 4 min    Activity Tolerance Patient tolerated treatment well    Behavior During Therapy WFL for tasks assessed/performed          Past Medical History:  Diagnosis Date   Allergic rhinitis    Allergy    As a child   Anemia    borderline, prior to hysterectomy   Asthma    Breast cancer (HCC) 2025   Cancer (HCC)    Breast   Fatigue    History of pneumonia age 24 and agin   my mom said at age 48 and again at age 50    Hypertension 05/28/2020   Multiple food allergies    Shortness of breath    Shortness of breath on exertion    Past Surgical History:  Procedure Laterality Date   ABDOMINAL HYSTERECTOMY N/A 06/16/2017   Procedure: HYSTERECTOMY ABDOMINAL TOTAL;  Surgeon: Rosalynn LELON Ingle, MD;  Location: [facility];  Service: Gynecology;  Laterality: N/A;   BILATERAL SALPINGECTOMY Bilateral 06/16/2017   Procedure: BILATERAL SALPINGECTOMY;  Surgeon: Rosalynn LELON Ingle, MD;  Location: [facility];  Service: Gynecology;  Laterality: Bilateral;   BREAST BIOPSY Left 06/06/2023   US  LT BREAST BX W LOC DEV 1ST LESION IMG BX SPEC US  GUIDE 06/06/2023 [facility] MAMMOGRAPHY   BREAST BIOPSY  07/08/2023   MM LT RADIOACTIVE SEED LOC MAMMO GUIDE 07/08/2023 [facility] MAMMOGRAPHY   BREAST LUMPECTOMY Left 07/20/2023   Procedure: BREAST LUMPECTOMY;  Surgeon: Aron Shoulders, MD;  Location: [facility];  Service: General;  Laterality: Left;  RE-EXCISION LUMPECTOMY LEFT BREAST   BREAST LUMPECTOMY Left 08/16/2023   Procedure: REEXCISION LEFT BREAST LUMPECTOMY;  Surgeon: Aron Shoulders, MD;   Location: [facility];  Service: General;  Laterality: Left;  RE-EXCISION LEFT LUMPECTOMY   BREAST LUMPECTOMY WITH RADIOACTIVE SEED AND SENTINEL LYMPH NODE BIOPSY Left 07/12/2023   Procedure: RADIOACTIVE SEED GUIDED LEFT BREAST LUMPECTOMY WITH LEFT AXILLARY SENTINEL LYMPH NODE BIOPSY;  Surgeon: Aron Shoulders, MD;  Location: [facility];  Service: General;  Laterality: Left;   BTL  11/2008   Dr Marget; bilateral tubal ligation    CESAREAN SECTION     x4 , with each child , has 4 children    DILATION AND CURETTAGE OF UTERUS  07/02/2012   MYOMECTOMY  2001   [facility] Ingle [facility]alynn [facility]    TUBAL LIGATION  2010   Patient Active Problem List   Diagnosis Date Noted   Menopausal symptoms 12/14/2023   Genetic testing 06/24/2023   Malignant neoplasm of upper-outer quadrant of left breast in female, estrogen receptor positive (HCC) 06/13/2023   Generalized obesity 03/03/2022   Abnormal craving 10/05/2021   Other hyperlipidemia 08/05/2021   Prediabetes 03/30/2021   Vitamin D  insufficiency 03/30/2021   Obesity (BMI 30.0-34.9) 12/03/2020   Hypertension 05/28/2020   S/P TAH (total abdominal hysterectomy) 06/16/2017   Physical exam, annual 10/30/2012   Allergic rhinitis 05/15/2007   Acute asthma 05/15/2007    REFERRING DIAG: left breast cancer at risk for  lymphedema  THERAPY DIAG:  Aftercare following surgery for neoplasm  PERTINENT HISTORY: Patient was diagnosed on 05/18/2023 with left grade 2 invasive ductal carcinoma breast cancer. It measured 1.5 cm and was located in the upper outer quadrant. It is ER/PR positive and HER2 negative with a Ki67 of 1%. Lumpectomy 07/12/23 with 2 negative nodes removed.  Re-excision 07/20/23. Will have radiation.   PRECAUTIONS: left UE Lymphedema risk, None  SUBJECTIVE: Pt returns for her 3 month L-Dex screen.   PAIN:  Are you having pain? No  SOZO SCREENING: Patient was assessed today using the SOZO machine to determine the  lymphedema index score. This was compared to her baseline score. It was determined that she is within the recommended range when compared to her baseline and no further action is needed at this time. She will continue SOZO screenings. These are done every 3 months for 2 years post operatively followed by every 6 months for 2 years, and then annually.    L-DEX FLOWSHEETS - 01/23/24 1000       L-DEX LYMPHEDEMA SCREENING   Measurement Type Unilateral    L-DEX MEASUREMENT EXTREMITY Upper Extremity    POSITION  Standing    DOMINANT SIDE Right    At Risk Side Left    BASELINE SCORE (UNILATERAL) 2.9    L-DEX SCORE (UNILATERAL) 2.5    VALUE CHANGE (UNILAT) -0.4         P: Cont every 3 month L-Dex screens until 07/2025, then can transition to every 6 months until 07/2027.   Aden Berwyn Caldron, PTA 01/23/2024, 11:00 AM     "

## 2024-02-08 ENCOUNTER — Encounter: Payer: Self-pay | Admitting: Adult Health

## 2024-02-09 LAB — GUARDANT REVEAL

## 2024-02-15 ENCOUNTER — Ambulatory Visit: Payer: Self-pay

## 2024-02-15 ENCOUNTER — Ambulatory Visit: Admitting: Nurse Practitioner

## 2024-02-15 ENCOUNTER — Encounter: Payer: Self-pay | Admitting: Nurse Practitioner

## 2024-02-15 ENCOUNTER — Ambulatory Visit (HOSPITAL_BASED_OUTPATIENT_CLINIC_OR_DEPARTMENT_OTHER)
Admission: RE | Admit: 2024-02-15 | Discharge: 2024-02-15 | Disposition: A | Discharge status: Home / Self Care | Source: Ambulatory Visit | Attending: Adult Health | Admitting: Adult Health

## 2024-02-15 VITALS — BP 118/78 | HR 78 | Temp 97.9°F | Ht 62.0 in | Wt 190.0 lb

## 2024-02-15 DIAGNOSIS — R632 Polyphagia: Secondary | ICD-10-CM | POA: Diagnosis not present

## 2024-02-15 DIAGNOSIS — E2839 Other primary ovarian failure: Secondary | ICD-10-CM | POA: Insufficient documentation

## 2024-02-15 DIAGNOSIS — Z6834 Body mass index (BMI) 34.0-34.9, adult: Secondary | ICD-10-CM

## 2024-02-15 DIAGNOSIS — Z79811 Long term (current) use of aromatase inhibitors: Secondary | ICD-10-CM | POA: Diagnosis present

## 2024-02-15 DIAGNOSIS — R748 Abnormal levels of other serum enzymes: Secondary | ICD-10-CM | POA: Diagnosis not present

## 2024-02-15 DIAGNOSIS — E66811 Obesity, class 1: Secondary | ICD-10-CM | POA: Diagnosis not present

## 2024-02-15 DIAGNOSIS — I10 Essential (primary) hypertension: Secondary | ICD-10-CM

## 2024-02-15 MED ORDER — PHENTERMINE-TOPIRAMATE ER 7.5-46 MG PO CP24: ORAL_CAPSULE | ORAL | 0 refills | Status: DC

## 2024-02-15 NOTE — Progress Notes (Signed)
 "  Office: [phone]  /  Fax: [phone]  WEIGHT SUMMARY AND BIOMETRICS  Weight Lost Since Last Visit: 3lb  Weight Gained Since Last Visit: 0lb   Vitals Temp: 97.9 F (36.6 C) BP: 118/78 Pulse Rate: 78 SpO2: 100 %   Anthropometric Measurements Height: 5' 2 (1.575 m) Weight: 190 lb (86.2 kg) BMI (Calculated): 34.74 Weight at Last Visit: 193lb Weight Lost Since Last Visit: 3lb Weight Gained Since Last Visit: 0lb Starting Weight: 205lb Total Weight Loss (lbs): 15 lb (6.804 kg)   Body Composition  Body Fat %: 37.2 % Fat Mass (lbs): 71 lbs Muscle Mass (lbs): 113.8 lbs Total Body Water (lbs): 77.2 lbs Visceral Fat Rating : 10   Other Clinical Data Fasting: No Labs: No Today's Visit #: 39 Starting Date: 03/26/21     HPI  Chief Complaint: OBESITY  Amber Stephens is here to discuss her progress with her obesity treatment plan. She is on the the Category 2 Plan and states she is following her eating plan approximately 80 % of the time. She states she is exercising 30 minutes 3 days per week.   Interval History:  Since last office visit she has lost 3 pounds.  She is not skipping meals. She is not currently tracking-plans to start this week.  She has cut out sweets since the end of December and she has been walking 3 days per week.  She is drinking water daily.  She is struggling with some fatigue since starting Gabapentin  for hot flashes.    She is going to [place] in June and [place]  later in the year.    Pharmacotherapy for weight loss: She is currently taking Qsymia  dose 2 around 11:30 am for medical weight loss.  Denies side effects.     Previous pharmacotherapy for medical weight loss:  Lomaira  & Wellbutrin  (stopped due to tremors)    Bariatric surgery:  Patient has not had bariatric surgery   Hypertension Hypertension improved.  Medication(s): Norvasc  5mg .  Denies side effects.   Denies chest pain, palpitations and SOB.  BP Readings from Last 3  Encounters:  02/15/24 118/78  01/18/24 134/86  01/18/24 116/83   Lab Results  Component Value Date   CREATININE 1.10 (H) 01/18/2024   CREATININE 0.94 06/15/2023   CREATININE 1.00 05/04/2023    Elevated creat levels Denies OTC meds since December.  Denies alcohol intake.   PHYSICAL EXAM:  Blood pressure 118/78, pulse 78, temperature 97.9 F (36.6 C), height 5' 2 (1.575 m), weight 190 lb (86.2 kg), last menstrual period 06/04/2017, SpO2 100%. Body mass index is 34.75 kg/m.  General: She is overweight, cooperative, alert, well developed, and in no acute distress. PSYCH: Has normal mood, affect and thought process.   Extremities: No edema.  Neurologic: No gross sensory or motor deficits. No tremors or fasciculations noted.    DIAGNOSTIC DATA REVIEWED:  BMET    Component Value Date/Time   NA 141 01/18/2024 0855   K 4.2 01/18/2024 0855   CL 106 01/18/2024 0855   CO2 23 01/18/2024 0855   GLUCOSE 91 01/18/2024 0855   GLUCOSE 96 06/15/2023 1220   BUN 17 01/18/2024 0855   CREATININE 1.10 (H) 01/18/2024 0855   CREATININE 0.94 06/15/2023 1220   CALCIUM 9.7 01/18/2024 0855   GFRNONAA >60 06/15/2023 1220   GFRAA >60 06/07/2017 0915   Lab Results  Component Value Date   HGBA1C 5.8 (H) 03/16/2023   HGBA1C 5.7 (H) 03/26/2021   Lab Results  Component Value Date  INSULIN  8.3 03/16/2023   INSULIN  19.1 03/26/2021   Lab Results  Component Value Date   TSH 0.741 03/16/2023   CBC    Component Value Date/Time   WBC 6.2 06/15/2023 1220   WBC 5.1 04/03/2019 1005   RBC 4.70 06/15/2023 1220   HGB 13.7 06/15/2023 1220   HGB 13.9 03/16/2023 1050   HCT 40.8 06/15/2023 1220   HCT 41.7 03/16/2023 1050   PLT 357 06/15/2023 1220   PLT 366 03/16/2023 1050   MCV 86.8 06/15/2023 1220   MCV 90 03/16/2023 1050   MCH 29.1 06/15/2023 1220   MCHC 33.6 06/15/2023 1220   RDW 13.3 06/15/2023 1220   RDW 12.4 03/16/2023 1050   Iron Studies    Component Value Date/Time   IRON 106  10/30/2012 1137   IRONPCTSAT 28.6 10/30/2012 1137   Lipid Panel     Component Value Date/Time   CHOL 160 03/16/2023 1050   TRIG 69 03/16/2023 1050   HDL 53 03/16/2023 1050   CHOLHDL 3 05/28/2020 0831   VLDL 11.4 05/28/2020 0831   LDLCALC 94 03/16/2023 1050   Hepatic Function Panel     Component Value Date/Time   PROT 7.0 01/18/2024 0855   ALBUMIN 4.3 01/18/2024 0855   AST 15 01/18/2024 0855   AST 18 06/15/2023 1220   ALT 14 01/18/2024 0855   ALT 18 06/15/2023 1220   ALKPHOS 65 01/18/2024 0855   BILITOT 0.4 01/18/2024 0855   BILITOT 0.4 06/15/2023 1220   BILIDIR 0.1 04/03/2019 1005      Component Value Date/Time   TSH 0.741 03/16/2023 1050   Nutritional Lab Results  Component Value Date   VD25OH 39.0 03/16/2023   VD25OH 83.1 03/03/2022   VD25OH 72.4 08/05/2021     ASSESSMENT AND PLAN  TREATMENT PLAN FOR OBESITY:  Recommended Dietary Goals  Jayne is currently in the action stage of change. As such, her goal is to continue weight management plan. She has agreed to practicing portion control and making smarter food choices, such as increasing vegetables and decreasing simple carbohydrates.  Behavioral Intervention  We discussed the following Behavioral Modification Strategies today: increasing lean protein intake to established goals, decreasing simple carbohydrates , increasing vegetables, increasing fiber rich foods, increasing water intake , work on meal planning and preparation, work on tracking and journaling calories using tracking application, reading food labels , keeping healthy foods at home, continue to work on maintaining a reduced calorie state, getting the recommended amount of protein, incorporating whole foods, making healthy choices, staying well hydrated and practicing mindfulness when eating., and increase protein intake, fibrous foods (25 grams per day for women, 30 grams for men) and water to improve satiety and decrease hunger signals.  .  Additional resources provided today: NA  Recommended Physical Activity Goals  Navah has been advised to work up to 150 minutes of moderate intensity aerobic activity a week and strengthening exercises 2-3 times per week for cardiovascular health, weight loss maintenance and preservation of muscle mass.   She has agreed to Think about enjoyable ways to increase daily physical activity and overcoming barriers to exercise, Increase physical activity in their day and reduce sedentary time (increase NEAT)., Work on scheduling and tracking physical activity. , Continue to gradually increase the amount and intensity of exercise routine, Increase volume of physical activity to a goal of 240 minutes a week, and Combine aerobic and strengthening exercises for efficiency and improved cardiometabolic health.   Pharmacotherapy We discussed various medication options  to help Laurali with her weight loss efforts and we both agreed to continue Qsymia  dose 2.  Side effects discussed. .  ASSOCIATED CONDITIONS ADDRESSED TODAY  Action/Plan  Primary hypertension Managed by PCP.  Continue med as directed  Elevated creatine kinase -     Basic metabolic panel with GFR  Polyphagia -     Phentermine -Topiramate  ER; Take one po daily  Dispense: 30 capsule; Refill: 0  Obesity (BMI 30.0-34.9) -     Phentermine -Topiramate  ER; Take one po daily  Dispense: 30 capsule; Refill: 0     Discussed today:  Stop Qsymia  and recheck labs in one month-to reevaluate creat levels  Discussing changing from Norvasc  to ACE with PCP due to elevated creat level Decreasing gabapentin  dose-patient is struggling with fatigue-could be raising creat levels Stopping Qsymia  and starting PO Wegovy  -Would also like her to discuss options with her PCP.     Return in about 3 weeks (around 03/07/2024).SABRA She was informed of the importance of frequent follow up visits to maximize her success with intensive lifestyle modifications for  her multiple health conditions.   ATTESTASTION STATEMENTS:  Reviewed by clinician on day of visit: allergies, medications, problem list, medical history, surgical history, family history, social history, and previous encounter notes.    Corean SAUNDERS. Brown Dunlap FNP-C "

## 2024-02-16 ENCOUNTER — Ambulatory Visit: Payer: Self-pay | Admitting: Nurse Practitioner

## 2024-02-16 LAB — BASIC METABOLIC PANEL WITH GFR
BUN/Creatinine Ratio: 20 (ref 9–23)
BUN: 21 mg/dL (ref 6–24)
CO2: 21 mmol/L (ref 20–29)
Calcium: 9.8 mg/dL (ref 8.7–10.2)
Chloride: 107 mmol/L — ABNORMAL HIGH (ref 96–106)
Creatinine, Ser: 1.04 mg/dL — ABNORMAL HIGH (ref 0.57–1.00)
Glucose: 93 mg/dL (ref 70–99)
Potassium: 4.4 mmol/L (ref 3.5–5.2)
Sodium: 142 mmol/L (ref 134–144)
eGFR: 66 mL/min/1.73

## 2024-03-07 ENCOUNTER — Encounter: Payer: Self-pay | Admitting: Nurse Practitioner

## 2024-03-07 ENCOUNTER — Ambulatory Visit: Admitting: Nurse Practitioner

## 2024-03-07 VITALS — BP 134/83 | HR 85 | Temp 98.7°F | Ht 62.0 in | Wt 189.0 lb

## 2024-03-07 DIAGNOSIS — E66811 Obesity, class 1: Secondary | ICD-10-CM

## 2024-03-07 DIAGNOSIS — R748 Abnormal levels of other serum enzymes: Secondary | ICD-10-CM

## 2024-03-07 DIAGNOSIS — Z6834 Body mass index (BMI) 34.0-34.9, adult: Secondary | ICD-10-CM

## 2024-03-07 DIAGNOSIS — E669 Obesity, unspecified: Secondary | ICD-10-CM | POA: Diagnosis not present

## 2024-03-07 DIAGNOSIS — R632 Polyphagia: Secondary | ICD-10-CM | POA: Diagnosis not present

## 2024-03-07 MED ORDER — PHENTERMINE-TOPIRAMATE ER 7.5-46 MG PO CP24: ORAL_CAPSULE | ORAL | 1 refills | Status: AC

## 2024-03-07 NOTE — Patient Instructions (Signed)
 What is Qsymia and how does it work?  Qsymia is a prescription only medicine to help with your weight loss. It is a combination of two medicines that are low dose, long-acting: Phentermine & Topiramate. Qsymia contains low dose Phentermine which is a stimulant medicine that could affect your heart rate and blood pressure Qsymia is designed to help you feel satisfied faster that will help you to decrease portion size. Also, it helps curb late night snacking habits. Some food you usually enjoy may start to taste differently which will help you make healthier food choices.  This medicine will be most effective when combined with a reduced calorie diet and physical activity.  How should I take Qsymia? Take daily in the morning with breakfast. Swallow the extended-release capsule whole. Do not crush, break, or chew it.  If you miss a dose, take it as soon as possible. If it is after 12pm, skip the missed dose and go back to your regular dosing schedule. Do not take extra medicine to make up for the missed dose. You have received two separate prescriptions today. You will initially take a lower dose of 3.75mg /23mg  for 14 days then increase to a higher dose of 7.5mg /46mg  for maintenance for 30 days. There are 4 total dosing options. Your provider will discuss any need to go up to a higher dose during your office visits.  If you are taking Levothyroxine, take the Levothyroxine 1 hours before breakfast and the take the Qsymia 1 hour after breakfast. Do not stop taking Qsymia without talking to your provider. Stopping Qsymia suddenly can cause serious side effects, such as seizures and headaches.   What should I avoid while taking Qsymia? Limit caffeine to 1 small cup daily. Examples are soda, coffee, tea, herbal tea, energy drinks, and chocolate Avoid decongestant medicines like Sudafed, Mucinex-D, and Zyrtec-D. Qsymia may cause you to feel dizzy, drowsy, or confused, or to have trouble thinking  or speaking. Do not drive or do anything else that could be dangerous until you know how this medicine affects you.  Women who can become pregnant: Use effective birth control (contraception) consistently while taking Qsymia. If you miss a menstrual period, STOP Qsymia and call our office immediately. Pregnancy tests will be performed at your appointment if indicated.  What side effects may I notice when taking Qsymia? Side effects that usually do not require medical attention (report to our office if they continue or are bothersome): Dry mouth (drink at least 64 oz of fluid daily) Constipation (you may take over the counter laxative if needed) Metallic taste in your mouth when drinking carbonated beverages Numbness or tingling in the hands, arms, feet or face that lasts more than a week Headache Sudden changes in vision Mental fuzziness (problems with concentration, attention, memory or speech) Trouble sleeping (insomnia) Side effects that you should report to our office as soon as possible: Increases in heart rate and/or palpitations (feeling like your heart is racing or pounding in your chest that lasts several minutes) Chest pain Increased blood pressure Dizziness or feeling faint Shortness of breath Irritability Feeling anxious, agitated, restless, or nervous Depression or severe changes in mood Problems urinating Unusual swelling of the legs Vomiting   Other important information You will be asked to sign an informed consent prior to starting Qysmia Qsymia is a federally controlled substance. Keep Qsymia in a safe place to prevent misuse and abuse. Selling or giving away Qsymia may harm others, and is against the law.  Your prescription  will be sent to the pharmacy during your visit.  Refills will require an office visit. Your insurance may not cover the cost of this medicine. Our office will complete a pre-authorization if required by your insurance.

## 2024-03-07 NOTE — Progress Notes (Signed)
 "  Office: [phone]  /  Fax: [phone]  WEIGHT SUMMARY AND BIOMETRICS  Weight Lost Since Last Visit: 1lb  Weight Gained Since Last Visit: 0lb   Vitals Temp: 98.7 F (37.1 C) BP: 134/83 Pulse Rate: 85 SpO2: 100 %   Anthropometric Measurements Height: 5' 2 (1.575 m) Weight: 189 lb (85.7 kg) BMI (Calculated): 34.56 Weight at Last Visit: 190lb Weight Lost Since Last Visit: 1lb Weight Gained Since Last Visit: 0lb Starting Weight: 205lb Total Weight Loss (lbs): 16 lb (7.258 kg)   Body Composition  Body Fat %: 36.7 % Fat Mass (lbs): 69.6 lbs Muscle Mass (lbs): 113.8 lbs Total Body Water (lbs): 76.2 lbs Visceral Fat Rating : 9   Other Clinical Data Fasting: Yes Labs: No Today's Visit #: 48 Starting Date: 03/26/21     HPI  Chief Complaint: OBESITY  Amber Stephens is here to discuss her progress with her obesity treatment plan. She is on the the Category 2 Plan and states she is following her eating plan approximately 75-80 % of the time. She states she is exercising 30 minutes 3 days per week.   Interval History:  Since last office visit she has lost 1 pound.  She has been struggling with her routine due to being out of work due to the weather.  She has been walking on the treadmill.  She has been cooking more at home.  She is drinking water daily.   Options Discussed at last visit:  Stop Qsymia  and recheck labs in one month-to reevaluate creat levels-patient continued Qsymia  dose 2  Discussing changing from Norvasc  to ACE with PCP due to elevated creat level-to discuss with PCP Decreasing gabapentin  dose-patient is struggling with fatigue-could be raising creat levels-patient is doing well at current dose and doesn't want to decrease Gabapentin  at this time-helping with sleep and hot flashes Stopping Qsymia  and starting PO Wegovy-discussed again today.  Patient would like to stay on current dose of Qsymia .   Pharmacotherapy for weight loss: She is currently  taking Qsymia  dose 2 around 11:30 am for medical weight loss.  Denies side effects.     Previous pharmacotherapy for medical weight loss:  Lomaira  & Wellbutrin  (stopped due to tremors)    Bariatric surgery:  Patient has not had bariatric surgery   Elevated creat   Improved     Latest Ref Rng & Units 02/15/2024    4:06 PM 01/18/2024    8:55 AM 06/15/2023   12:20 PM  CMP  Glucose 70 - 99 mg/dL 93  91  96   BUN 6 - 24 mg/dL 21  17  20    Creatinine 0.57 - 1.00 mg/dL 8.95  8.89  9.05   Sodium 134 - 144 mmol/L 142  141  140   Potassium 3.5 - 5.2 mmol/L 4.4  4.2  4.2   Chloride 96 - 106 mmol/L 107  106  106   CO2 20 - 29 mmol/L 21  23  29    Calcium 8.7 - 10.2 mg/dL 9.8  9.7  9.6   Total Protein 6.0 - 8.5 g/dL  7.0  7.7   Total Bilirubin 0.0 - 1.2 mg/dL  0.4  0.4   Alkaline Phos 41 - 116 IU/L  65  48   AST 0 - 40 IU/L  15  18   ALT 0 - 32 IU/L  14  18     PHYSICAL EXAM:  Blood pressure 134/83, pulse 85, temperature 98.7 F (37.1 C), height 5' 2 (1.575 m),  weight 189 lb (85.7 kg), last menstrual period 06/04/2017, SpO2 100%. Body mass index is 34.57 kg/m.  General: She is overweight, cooperative, alert, well developed, and in no acute distress. PSYCH: Has normal mood, affect and thought process.   Extremities: No edema.  Neurologic: No gross sensory or motor deficits. No tremors or fasciculations noted.    DIAGNOSTIC DATA REVIEWED:  BMET    Component Value Date/Time   NA 142 02/15/2024 1606   K 4.4 02/15/2024 1606   CL 107 (H) 02/15/2024 1606   CO2 21 02/15/2024 1606   GLUCOSE 93 02/15/2024 1606   GLUCOSE 96 06/15/2023 1220   BUN 21 02/15/2024 1606   CREATININE 1.04 (H) 02/15/2024 1606   CREATININE 0.94 06/15/2023 1220   CALCIUM 9.8 02/15/2024 1606   GFRNONAA >60 06/15/2023 1220   GFRAA >60 06/07/2017 0915   Lab Results  Component Value Date   HGBA1C 5.8 (H) 03/16/2023   HGBA1C 5.7 (H) 03/26/2021   Lab Results  Component Value Date   INSULIN  8.3 03/16/2023    INSULIN  19.1 03/26/2021   Lab Results  Component Value Date   TSH 0.741 03/16/2023   CBC    Component Value Date/Time   WBC 6.2 06/15/2023 1220   WBC 5.1 04/03/2019 1005   RBC 4.70 06/15/2023 1220   HGB 13.7 06/15/2023 1220   HGB 13.9 03/16/2023 1050   HCT 40.8 06/15/2023 1220   HCT 41.7 03/16/2023 1050   PLT 357 06/15/2023 1220   PLT 366 03/16/2023 1050   MCV 86.8 06/15/2023 1220   MCV 90 03/16/2023 1050   MCH 29.1 06/15/2023 1220   MCHC 33.6 06/15/2023 1220   RDW 13.3 06/15/2023 1220   RDW 12.4 03/16/2023 1050   Iron Studies    Component Value Date/Time   IRON 106 10/30/2012 1137   IRONPCTSAT 28.6 10/30/2012 1137   Lipid Panel     Component Value Date/Time   CHOL 160 03/16/2023 1050   TRIG 69 03/16/2023 1050   HDL 53 03/16/2023 1050   CHOLHDL 3 05/28/2020 0831   VLDL 11.4 05/28/2020 0831   LDLCALC 94 03/16/2023 1050   Hepatic Function Panel     Component Value Date/Time   PROT 7.0 01/18/2024 0855   ALBUMIN 4.3 01/18/2024 0855   AST 15 01/18/2024 0855   AST 18 06/15/2023 1220   ALT 14 01/18/2024 0855   ALT 18 06/15/2023 1220   ALKPHOS 65 01/18/2024 0855   BILITOT 0.4 01/18/2024 0855   BILITOT 0.4 06/15/2023 1220   BILIDIR 0.1 04/03/2019 1005      Component Value Date/Time   TSH 0.741 03/16/2023 1050   Nutritional Lab Results  Component Value Date   VD25OH 39.0 03/16/2023   VD25OH 83.1 03/03/2022   VD25OH 72.4 08/05/2021     ASSESSMENT AND PLAN  TREATMENT PLAN FOR OBESITY:  Recommended Dietary Goals  Amber Stephens is currently in the action stage of change. As such, her goal is to continue weight management plan. She has agreed to start tracking and will review at next visit.  Behavioral Intervention  We discussed the following Behavioral Modification Strategies today: increasing lean protein intake to established goals, decreasing simple carbohydrates , increasing vegetables, increasing fiber rich foods, increasing water intake , work on meal  planning and preparation, work on tracking and journaling calories using tracking application, reading food labels , keeping healthy foods at home, planning for success, continue to work on maintaining a reduced calorie state, getting the recommended amount of protein, incorporating  whole foods, making healthy choices, staying well hydrated and practicing mindfulness when eating., and increase protein intake, fibrous foods (25 grams per day for women, 30 grams for men) and water to improve satiety and decrease hunger signals. .  Additional resources provided today: NA  Recommended Physical Activity Goals  Tashana has been advised to work up to 150 minutes of moderate intensity aerobic activity a week and strengthening exercises 2-3 times per week for cardiovascular health, weight loss maintenance and preservation of muscle mass.   She has agreed to Think about enjoyable ways to increase daily physical activity and overcoming barriers to exercise, Increase physical activity in their day and reduce sedentary time (increase NEAT)., Start strengthening exercises with a goal of 2-3 sessions a week , Work on scheduling and tracking physical activity. , Continue to gradually increase the amount and intensity of exercise routine, Increase volume of physical activity to a goal of 240 minutes a week, and Combine aerobic and strengthening exercises for efficiency and improved cardiometabolic health.   Pharmacotherapy We discussed various medication options to help Shawna with her weight loss efforts and we both agreed to continue Qsymia  dose 2.  Side effects discussed.  ASSOCIATED CONDITIONS ADDRESSED TODAY  Action/Plan  Elevated creatine kinase Improved Labs reviewed with patient Will continue to monitor.   Polyphagia -     Phentermine -Topiramate  ER; Take one po daily  Dispense: 30 capsule; Refill: 1  Obesity (BMI 30.0-34.9) -     Phentermine -Topiramate  ER; Take one po daily  Dispense: 30 capsule;  Refill: 1      Goals: Start tracking and will review macros at next visit-mynetdiary   Return in about 5 weeks (around 04/11/2024).SABRA She was informed of the importance of frequent follow up visits to maximize her success with intensive lifestyle modifications for her multiple health conditions.   ATTESTASTION STATEMENTS:  Reviewed by clinician on day of visit: allergies, medications, problem list, medical history, surgical history, family history, social history, and previous encounter notes.     Corean SAUNDERS. Ceriah Kohler FNP-C "

## 2024-04-11 ENCOUNTER — Ambulatory Visit: Admitting: Nurse Practitioner

## 2024-04-25 ENCOUNTER — Ambulatory Visit: Attending: General Surgery

## 2024-06-13 ENCOUNTER — Ambulatory Visit: Admitting: Family

## 2024-07-19 ENCOUNTER — Inpatient Hospital Stay: Admitting: Hematology and Oncology

## 2024-07-19 ENCOUNTER — Inpatient Hospital Stay
